# Patient Record
Sex: Female | Born: 1965
Health system: Southern US, Community
[De-identification: ages and names within clinical notes are randomized; demographics above are authoritative.]

## PROBLEM LIST (undated history)

## (undated) DIAGNOSIS — R51 Headache: Secondary | ICD-10-CM

## (undated) DIAGNOSIS — K219 Gastro-esophageal reflux disease without esophagitis: Secondary | ICD-10-CM

## (undated) DIAGNOSIS — F4310 Post-traumatic stress disorder, unspecified: Secondary | ICD-10-CM

## (undated) DIAGNOSIS — I10 Essential (primary) hypertension: Secondary | ICD-10-CM

## (undated) DIAGNOSIS — F419 Anxiety disorder, unspecified: Secondary | ICD-10-CM

## (undated) DIAGNOSIS — F32A Depression, unspecified: Secondary | ICD-10-CM

## (undated) DIAGNOSIS — R519 Headache, unspecified: Secondary | ICD-10-CM

## (undated) DIAGNOSIS — Z8489 Family history of other specified conditions: Secondary | ICD-10-CM

## (undated) DIAGNOSIS — F329 Major depressive disorder, single episode, unspecified: Secondary | ICD-10-CM

## (undated) DIAGNOSIS — R011 Cardiac murmur, unspecified: Secondary | ICD-10-CM

## (undated) DIAGNOSIS — D473 Essential (hemorrhagic) thrombocythemia: Secondary | ICD-10-CM

## (undated) HISTORY — DX: Anxiety disorder, unspecified: F41.9

## (undated) HISTORY — DX: Post-traumatic stress disorder, unspecified: F43.10

## (undated) HISTORY — DX: Major depressive disorder, single episode, unspecified: F32.9

## (undated) HISTORY — DX: Depression, unspecified: F32.A

## (undated) HISTORY — DX: Essential (primary) hypertension: I10

---

## 1998-02-01 ENCOUNTER — Other Ambulatory Visit: Admission: RE | Admit: 1998-02-01 | Discharge: 1998-02-01 | Payer: Self-pay | Admitting: Obstetrics and Gynecology

## 1998-09-09 ENCOUNTER — Encounter: Payer: Self-pay | Admitting: Obstetrics and Gynecology

## 1998-09-09 ENCOUNTER — Ambulatory Visit (HOSPITAL_COMMUNITY): Admission: RE | Admit: 1998-09-09 | Discharge: 1998-09-09 | Payer: Self-pay | Admitting: Obstetrics and Gynecology

## 1999-01-26 ENCOUNTER — Emergency Department (HOSPITAL_COMMUNITY): Admission: EM | Admit: 1999-01-26 | Discharge: 1999-01-26 | Payer: Self-pay | Admitting: Emergency Medicine

## 1999-01-30 ENCOUNTER — Emergency Department (HOSPITAL_COMMUNITY): Admission: EM | Admit: 1999-01-30 | Discharge: 1999-01-30 | Payer: Self-pay | Admitting: Emergency Medicine

## 1999-02-05 ENCOUNTER — Other Ambulatory Visit: Admission: RE | Admit: 1999-02-05 | Discharge: 1999-02-05 | Payer: Self-pay | Admitting: Obstetrics and Gynecology

## 1999-03-11 ENCOUNTER — Other Ambulatory Visit: Admission: RE | Admit: 1999-03-11 | Discharge: 1999-03-11 | Payer: Self-pay | Admitting: Obstetrics and Gynecology

## 1999-03-11 ENCOUNTER — Encounter (INDEPENDENT_AMBULATORY_CARE_PROVIDER_SITE_OTHER): Payer: Self-pay | Admitting: Specialist

## 1999-09-05 ENCOUNTER — Other Ambulatory Visit: Admission: RE | Admit: 1999-09-05 | Discharge: 1999-09-05 | Payer: Self-pay | Admitting: Obstetrics and Gynecology

## 2000-01-08 ENCOUNTER — Other Ambulatory Visit: Admission: RE | Admit: 2000-01-08 | Discharge: 2000-01-08 | Payer: Self-pay | Admitting: Obstetrics and Gynecology

## 2001-03-17 ENCOUNTER — Other Ambulatory Visit: Admission: RE | Admit: 2001-03-17 | Discharge: 2001-03-17 | Payer: Self-pay | Admitting: Obstetrics and Gynecology

## 2001-11-15 ENCOUNTER — Encounter (INDEPENDENT_AMBULATORY_CARE_PROVIDER_SITE_OTHER): Payer: Self-pay | Admitting: Specialist

## 2001-11-15 ENCOUNTER — Ambulatory Visit (HOSPITAL_COMMUNITY): Admission: RE | Admit: 2001-11-15 | Discharge: 2001-11-15 | Payer: Self-pay | Admitting: Obstetrics and Gynecology

## 2002-04-07 ENCOUNTER — Other Ambulatory Visit: Admission: RE | Admit: 2002-04-07 | Discharge: 2002-04-07 | Payer: Self-pay | Admitting: Obstetrics and Gynecology

## 2002-12-27 ENCOUNTER — Emergency Department (HOSPITAL_COMMUNITY): Admission: EM | Admit: 2002-12-27 | Discharge: 2002-12-28 | Payer: Self-pay | Admitting: Emergency Medicine

## 2003-06-06 ENCOUNTER — Other Ambulatory Visit: Admission: RE | Admit: 2003-06-06 | Discharge: 2003-06-06 | Payer: Self-pay | Admitting: Obstetrics and Gynecology

## 2004-05-21 ENCOUNTER — Ambulatory Visit (HOSPITAL_COMMUNITY): Admission: RE | Admit: 2004-05-21 | Discharge: 2004-05-21 | Payer: Self-pay | Admitting: Obstetrics and Gynecology

## 2004-05-21 ENCOUNTER — Encounter (INDEPENDENT_AMBULATORY_CARE_PROVIDER_SITE_OTHER): Payer: Self-pay | Admitting: Specialist

## 2004-10-28 ENCOUNTER — Ambulatory Visit: Payer: Self-pay | Admitting: Oncology

## 2004-11-19 ENCOUNTER — Ambulatory Visit (HOSPITAL_COMMUNITY): Admission: RE | Admit: 2004-11-19 | Discharge: 2004-11-19 | Payer: Self-pay | Admitting: Oncology

## 2004-12-10 ENCOUNTER — Ambulatory Visit: Payer: Self-pay | Admitting: Oncology

## 2004-12-10 ENCOUNTER — Ambulatory Visit (HOSPITAL_COMMUNITY): Admission: RE | Admit: 2004-12-10 | Discharge: 2004-12-10 | Payer: Self-pay | Admitting: Oncology

## 2004-12-10 ENCOUNTER — Encounter (INDEPENDENT_AMBULATORY_CARE_PROVIDER_SITE_OTHER): Payer: Self-pay | Admitting: *Deleted

## 2004-12-16 ENCOUNTER — Ambulatory Visit: Payer: Self-pay | Admitting: Oncology

## 2005-02-04 ENCOUNTER — Ambulatory Visit: Payer: Self-pay | Admitting: Oncology

## 2005-03-28 ENCOUNTER — Ambulatory Visit: Payer: Self-pay | Admitting: Oncology

## 2005-05-13 ENCOUNTER — Ambulatory Visit: Payer: Self-pay | Admitting: Oncology

## 2005-07-08 ENCOUNTER — Ambulatory Visit: Payer: Self-pay | Admitting: Oncology

## 2005-09-15 ENCOUNTER — Ambulatory Visit: Payer: Self-pay | Admitting: Oncology

## 2005-11-11 ENCOUNTER — Ambulatory Visit: Payer: Self-pay | Admitting: Oncology

## 2005-11-11 LAB — CBC WITH DIFFERENTIAL/PLATELET
BASO%: 1.5 % (ref 0.0–2.0)
EOS%: 0.8 % (ref 0.0–7.0)
HCT: 37 % (ref 34.8–46.6)
LYMPH%: 58.6 % — ABNORMAL HIGH (ref 14.0–48.0)
MCH: 36 pg — ABNORMAL HIGH (ref 26.0–34.0)
MCHC: 33.8 g/dL (ref 32.0–36.0)
NEUT%: 30.2 % — ABNORMAL LOW (ref 39.6–76.8)
Platelets: 536 10*3/uL — ABNORMAL HIGH (ref 145–400)
RBC: 3.46 10*6/uL — ABNORMAL LOW (ref 3.70–5.32)

## 2005-11-11 LAB — COMPREHENSIVE METABOLIC PANEL
Albumin: 4.3 g/dL (ref 3.5–5.2)
Alkaline Phosphatase: 72 U/L (ref 39–117)
CO2: 26 mEq/L (ref 19–32)
Calcium: 9 mg/dL (ref 8.4–10.5)
Chloride: 105 mEq/L (ref 96–112)
Glucose, Bld: 87 mg/dL (ref 70–99)
Potassium: 4.2 mEq/L (ref 3.5–5.3)
Sodium: 139 mEq/L (ref 135–145)
Total Protein: 7.1 g/dL (ref 6.0–8.3)

## 2005-11-25 LAB — CBC WITH DIFFERENTIAL/PLATELET
Basophils Absolute: 0 10*3/uL (ref 0.0–0.1)
Eosinophils Absolute: 0.1 10*3/uL (ref 0.0–0.5)
HCT: 36.9 % (ref 34.8–46.6)
HGB: 12.4 g/dL (ref 11.6–15.9)
LYMPH%: 34.2 % (ref 14.0–48.0)
MCV: 108 fL — ABNORMAL HIGH (ref 81.0–101.0)
MONO#: 0.3 10*3/uL (ref 0.1–0.9)
MONO%: 5.4 % (ref 0.0–13.0)
NEUT#: 3 10*3/uL (ref 1.5–6.5)
Platelets: 558 10*3/uL — ABNORMAL HIGH (ref 145–400)
RBC: 3.42 10*6/uL — ABNORMAL LOW (ref 3.70–5.32)
WBC: 5.1 10*3/uL (ref 3.9–10.0)

## 2005-12-09 LAB — CBC WITH DIFFERENTIAL/PLATELET
BASO%: 1.3 % (ref 0.0–2.0)
Eosinophils Absolute: 0.1 10*3/uL (ref 0.0–0.5)
HCT: 34.5 % — ABNORMAL LOW (ref 34.8–46.6)
LYMPH%: 55.3 % — ABNORMAL HIGH (ref 14.0–48.0)
MCHC: 33.9 g/dL (ref 32.0–36.0)
MCV: 106.2 fL — ABNORMAL HIGH (ref 81.0–101.0)
MONO#: 0.3 10*3/uL (ref 0.1–0.9)
MONO%: 7.8 % (ref 0.0–13.0)
NEUT%: 34.2 % — ABNORMAL LOW (ref 39.6–76.8)
Platelets: 457 10*3/uL — ABNORMAL HIGH (ref 145–400)
RBC: 3.24 10*6/uL — ABNORMAL LOW (ref 3.70–5.32)
WBC: 3.6 10*3/uL — ABNORMAL LOW (ref 3.9–10.0)

## 2006-01-02 ENCOUNTER — Ambulatory Visit: Payer: Self-pay | Admitting: Oncology

## 2006-01-06 LAB — CBC WITH DIFFERENTIAL/PLATELET
Basophils Absolute: 0 10*3/uL (ref 0.0–0.1)
Eosinophils Absolute: 0 10*3/uL (ref 0.0–0.5)
HGB: 14.2 g/dL (ref 11.6–15.9)
MCV: 106.7 fL — ABNORMAL HIGH (ref 81.0–101.0)
MONO#: 0.3 10*3/uL (ref 0.1–0.9)
MONO%: 5.4 % (ref 0.0–13.0)
NEUT#: 2.4 10*3/uL (ref 1.5–6.5)
RBC: 3.96 10*6/uL (ref 3.70–5.32)
RDW: 13.8 % (ref 11.3–14.5)
WBC: 4.9 10*3/uL (ref 3.9–10.0)
lymph#: 2.1 10*3/uL (ref 0.9–3.3)

## 2006-02-03 LAB — CBC WITH DIFFERENTIAL/PLATELET
Basophils Absolute: 0.1 10*3/uL (ref 0.0–0.1)
EOS%: 1.6 % (ref 0.0–7.0)
Eosinophils Absolute: 0.1 10*3/uL (ref 0.0–0.5)
HCT: 34.9 % (ref 34.8–46.6)
HGB: 11.8 g/dL (ref 11.6–15.9)
LYMPH%: 37.1 % (ref 14.0–48.0)
MCH: 36.3 pg — ABNORMAL HIGH (ref 26.0–34.0)
MCV: 107 fL — ABNORMAL HIGH (ref 81.0–101.0)
MONO%: 4.6 % (ref 0.0–13.0)
NEUT#: 2.1 10*3/uL (ref 1.5–6.5)
NEUT%: 55.1 % (ref 39.6–76.8)
Platelets: 464 10*3/uL — ABNORMAL HIGH (ref 145–400)
RDW: 13.5 % (ref 11.3–14.5)

## 2006-02-13 ENCOUNTER — Ambulatory Visit: Payer: Self-pay | Admitting: Oncology

## 2006-02-13 LAB — COMPREHENSIVE METABOLIC PANEL
AST: 16 U/L (ref 0–37)
Albumin: 4.3 g/dL (ref 3.5–5.2)
BUN: 12 mg/dL (ref 6–23)
Calcium: 9.1 mg/dL (ref 8.4–10.5)
Chloride: 104 mEq/L (ref 96–112)
Creatinine, Ser: 1.03 mg/dL (ref 0.40–1.20)
Glucose, Bld: 123 mg/dL — ABNORMAL HIGH (ref 70–99)
Potassium: 4 mEq/L (ref 3.5–5.3)

## 2006-02-13 LAB — CBC WITH DIFFERENTIAL/PLATELET
EOS%: 1.3 % (ref 0.0–7.0)
Eosinophils Absolute: 0 10*3/uL (ref 0.0–0.5)
LYMPH%: 52.1 % — ABNORMAL HIGH (ref 14.0–48.0)
MCH: 35.6 pg — ABNORMAL HIGH (ref 26.0–34.0)
MCV: 106.5 fL — ABNORMAL HIGH (ref 81.0–101.0)
MONO%: 6.7 % (ref 0.0–13.0)
NEUT#: 1.5 10*3/uL (ref 1.5–6.5)
Platelets: 438 10*3/uL — ABNORMAL HIGH (ref 145–400)
RBC: 3.35 10*6/uL — ABNORMAL LOW (ref 3.70–5.32)

## 2006-03-17 LAB — CBC WITH DIFFERENTIAL/PLATELET
BASO%: 0.7 % (ref 0.0–2.0)
Basophils Absolute: 0 10*3/uL (ref 0.0–0.1)
EOS%: 1.1 % (ref 0.0–7.0)
HCT: 34.2 % — ABNORMAL LOW (ref 34.8–46.6)
HGB: 11.8 g/dL (ref 11.6–15.9)
MCH: 36.5 pg — ABNORMAL HIGH (ref 26.0–34.0)
MCHC: 34.4 g/dL (ref 32.0–36.0)
MONO#: 0.2 10*3/uL (ref 0.1–0.9)
NEUT%: 39.7 % (ref 39.6–76.8)
RDW: 14 % (ref 11.3–14.5)
WBC: 3.5 10*3/uL — ABNORMAL LOW (ref 3.9–10.0)
lymph#: 1.9 10*3/uL (ref 0.9–3.3)

## 2006-04-10 ENCOUNTER — Ambulatory Visit: Payer: Self-pay | Admitting: Oncology

## 2006-04-14 LAB — CBC WITH DIFFERENTIAL/PLATELET
Basophils Absolute: 0 10*3/uL (ref 0.0–0.1)
Eosinophils Absolute: 0.1 10*3/uL (ref 0.0–0.5)
HCT: 35.2 % (ref 34.8–46.6)
HGB: 11.9 g/dL (ref 11.6–15.9)
MCH: 36.2 pg — ABNORMAL HIGH (ref 26.0–34.0)
MONO#: 0.2 10*3/uL (ref 0.1–0.9)
NEUT#: 2 10*3/uL (ref 1.5–6.5)
NEUT%: 45.4 % (ref 39.6–76.8)
RDW: 14.1 % (ref 11.3–14.5)
lymph#: 2.1 10*3/uL (ref 0.9–3.3)

## 2006-05-19 LAB — CBC WITH DIFFERENTIAL/PLATELET
BASO%: 0.5 % (ref 0.0–2.0)
HCT: 37 % (ref 34.8–46.6)
MCHC: 33.9 g/dL (ref 32.0–36.0)
MONO#: 0.3 10*3/uL (ref 0.1–0.9)
NEUT%: 51 % (ref 39.6–76.8)
RBC: 3.52 10*6/uL — ABNORMAL LOW (ref 3.70–5.32)
WBC: 4.6 10*3/uL (ref 3.9–10.0)
lymph#: 1.9 10*3/uL (ref 0.9–3.3)

## 2006-05-26 ENCOUNTER — Ambulatory Visit: Payer: Self-pay | Admitting: Oncology

## 2006-05-26 LAB — CBC WITH DIFFERENTIAL/PLATELET
BASO%: 0.6 % (ref 0.0–2.0)
EOS%: 0.8 % (ref 0.0–7.0)
MCH: 35.9 pg — ABNORMAL HIGH (ref 26.0–34.0)
MCHC: 33.9 g/dL (ref 32.0–36.0)
MONO#: 0.3 10*3/uL (ref 0.1–0.9)
RBC: 3.71 10*6/uL (ref 3.70–5.32)
WBC: 4.5 10*3/uL (ref 3.9–10.0)
lymph#: 2.2 10*3/uL (ref 0.9–3.3)

## 2006-06-02 LAB — CBC WITH DIFFERENTIAL/PLATELET
Basophils Absolute: 0 10*3/uL (ref 0.0–0.1)
Eosinophils Absolute: 0 10*3/uL (ref 0.0–0.5)
HGB: 13 g/dL (ref 11.6–15.9)
MCV: 104.6 fL — ABNORMAL HIGH (ref 81.0–101.0)
MONO#: 0.3 10*3/uL (ref 0.1–0.9)
MONO%: 6.9 % (ref 0.0–13.0)
NEUT#: 1.3 10*3/uL — ABNORMAL LOW (ref 1.5–6.5)
RDW: 13.1 % (ref 11.3–14.5)
WBC: 4 10*3/uL (ref 3.9–10.0)
lymph#: 2.3 10*3/uL (ref 0.9–3.3)

## 2006-06-09 LAB — COMPREHENSIVE METABOLIC PANEL
Albumin: 4.3 g/dL (ref 3.5–5.2)
BUN: 12 mg/dL (ref 6–23)
CO2: 28 mEq/L (ref 19–32)
Calcium: 9.1 mg/dL (ref 8.4–10.5)
Chloride: 112 mEq/L (ref 96–112)
Glucose, Bld: 85 mg/dL (ref 70–99)
Potassium: 4.4 mEq/L (ref 3.5–5.3)

## 2006-06-09 LAB — CBC WITH DIFFERENTIAL/PLATELET
Basophils Absolute: 0 10*3/uL (ref 0.0–0.1)
Eosinophils Absolute: 0.1 10*3/uL (ref 0.0–0.5)
HCT: 35.9 % (ref 34.8–46.6)
HGB: 12.2 g/dL (ref 11.6–15.9)
NEUT#: 2.4 10*3/uL (ref 1.5–6.5)
NEUT%: 50.2 % (ref 39.6–76.8)
RDW: 13.6 % (ref 11.3–14.5)
lymph#: 2 10*3/uL (ref 0.9–3.3)

## 2006-06-09 LAB — LACTATE DEHYDROGENASE: LDH: 205 U/L (ref 94–250)

## 2006-06-12 LAB — JAK2 GENOTYPR

## 2006-06-23 LAB — CBC WITH DIFFERENTIAL/PLATELET
BASO%: 0.5 % (ref 0.0–2.0)
LYMPH%: 43.6 % (ref 14.0–48.0)
MCHC: 34.1 g/dL (ref 32.0–36.0)
MONO#: 0.3 10*3/uL (ref 0.1–0.9)
Platelets: 465 10*3/uL — ABNORMAL HIGH (ref 145–400)
RBC: 3.5 10*6/uL — ABNORMAL LOW (ref 3.70–5.32)
RDW: 13.7 % (ref 11.3–14.5)
WBC: 4.3 10*3/uL (ref 3.9–10.0)
lymph#: 1.9 10*3/uL (ref 0.9–3.3)

## 2006-07-07 LAB — CBC WITH DIFFERENTIAL/PLATELET
BASO%: 0.6 % (ref 0.0–2.0)
EOS%: 1 % (ref 0.0–7.0)
MCH: 36 pg — ABNORMAL HIGH (ref 26.0–34.0)
MCHC: 33.8 g/dL (ref 32.0–36.0)
NEUT%: 44.5 % (ref 39.6–76.8)
RDW: 14.6 % — ABNORMAL HIGH (ref 11.3–14.5)
lymph#: 1.7 10*3/uL (ref 0.9–3.3)

## 2006-07-15 ENCOUNTER — Ambulatory Visit: Payer: Self-pay | Admitting: Oncology

## 2006-07-22 LAB — CBC WITH DIFFERENTIAL/PLATELET
Basophils Absolute: 0 10*3/uL (ref 0.0–0.1)
EOS%: 1.2 % (ref 0.0–7.0)
HGB: 12.4 g/dL (ref 11.6–15.9)
MCH: 35.8 pg — ABNORMAL HIGH (ref 26.0–34.0)
NEUT#: 1.5 10*3/uL (ref 1.5–6.5)
RDW: 15.1 % — ABNORMAL HIGH (ref 11.3–14.5)
lymph#: 2 10*3/uL (ref 0.9–3.3)

## 2006-08-04 LAB — COMPREHENSIVE METABOLIC PANEL
ALT: 13 U/L (ref 0–35)
Alkaline Phosphatase: 85 U/L (ref 39–117)
CO2: 27 mEq/L (ref 19–32)
Sodium: 138 mEq/L (ref 135–145)
Total Bilirubin: 0.3 mg/dL (ref 0.3–1.2)
Total Protein: 7.3 g/dL (ref 6.0–8.3)

## 2006-08-04 LAB — CBC WITH DIFFERENTIAL/PLATELET
BASO%: 0.6 % (ref 0.0–2.0)
LYMPH%: 48.4 % — ABNORMAL HIGH (ref 14.0–48.0)
MCHC: 33.5 g/dL (ref 32.0–36.0)
MONO#: 0.3 10*3/uL (ref 0.1–0.9)
MONO%: 6.6 % (ref 0.0–13.0)
Platelets: 494 10*3/uL — ABNORMAL HIGH (ref 145–400)
RBC: 3.41 10*6/uL — ABNORMAL LOW (ref 3.70–5.32)
WBC: 4.2 10*3/uL (ref 3.9–10.0)

## 2006-08-28 ENCOUNTER — Ambulatory Visit: Payer: Self-pay | Admitting: Oncology

## 2006-09-01 LAB — CBC WITH DIFFERENTIAL/PLATELET
BASO%: 0.6 % (ref 0.0–2.0)
Basophils Absolute: 0 10*3/uL (ref 0.0–0.1)
EOS%: 1.3 % (ref 0.0–7.0)
HGB: 12.3 g/dL (ref 11.6–15.9)
MCH: 37.2 pg — ABNORMAL HIGH (ref 26.0–34.0)
MCV: 105.8 fL — ABNORMAL HIGH (ref 81.0–101.0)
MONO%: 8.1 % (ref 0.0–13.0)
RBC: 3.31 10*6/uL — ABNORMAL LOW (ref 3.70–5.32)
RDW: 14.9 % — ABNORMAL HIGH (ref 11.3–14.5)
lymph#: 1.6 10*3/uL (ref 0.9–3.3)

## 2006-09-29 LAB — CBC WITH DIFFERENTIAL/PLATELET
Basophils Absolute: 0 10*3/uL (ref 0.0–0.1)
Eosinophils Absolute: 0 10*3/uL (ref 0.0–0.5)
HGB: 12.2 g/dL (ref 11.6–15.9)
MCV: 106.8 fL — ABNORMAL HIGH (ref 81.0–101.0)
MONO#: 0.3 10*3/uL (ref 0.1–0.9)
MONO%: 7.4 % (ref 0.0–13.0)
NEUT#: 1.2 10*3/uL — ABNORMAL LOW (ref 1.5–6.5)
RBC: 3.41 10*6/uL — ABNORMAL LOW (ref 3.70–5.32)
RDW: 14.1 % (ref 11.3–14.5)
WBC: 3.6 10*3/uL — ABNORMAL LOW (ref 3.9–10.0)
lymph#: 2.1 10*3/uL (ref 0.9–3.3)

## 2006-10-29 ENCOUNTER — Ambulatory Visit: Payer: Self-pay | Admitting: Oncology

## 2006-11-03 LAB — LACTATE DEHYDROGENASE: LDH: 190 U/L (ref 94–250)

## 2006-11-03 LAB — CBC WITH DIFFERENTIAL/PLATELET
Basophils Absolute: 0 10*3/uL (ref 0.0–0.1)
Eosinophils Absolute: 0 10*3/uL (ref 0.0–0.5)
HGB: 11.7 g/dL (ref 11.6–15.9)
LYMPH%: 44 % (ref 14.0–48.0)
MCV: 106.2 fL — ABNORMAL HIGH (ref 81.0–101.0)
MONO#: 0.3 10*3/uL (ref 0.1–0.9)
MONO%: 7.6 % (ref 0.0–13.0)
NEUT#: 1.7 10*3/uL (ref 1.5–6.5)
Platelets: 492 10*3/uL — ABNORMAL HIGH (ref 145–400)
RBC: 3.15 10*6/uL — ABNORMAL LOW (ref 3.70–5.32)
WBC: 3.7 10*3/uL — ABNORMAL LOW (ref 3.9–10.0)

## 2006-11-03 LAB — IRON AND TIBC
%SAT: 18 % — ABNORMAL LOW (ref 20–55)
TIBC: 328 ug/dL (ref 250–470)
UIBC: 270 ug/dL

## 2006-11-03 LAB — COMPREHENSIVE METABOLIC PANEL
Albumin: 4.1 g/dL (ref 3.5–5.2)
BUN: 11 mg/dL (ref 6–23)
CO2: 23 mEq/L (ref 19–32)
Glucose, Bld: 110 mg/dL — ABNORMAL HIGH (ref 70–99)
Potassium: 3.7 mEq/L (ref 3.5–5.3)
Sodium: 139 mEq/L (ref 135–145)
Total Bilirubin: 0.3 mg/dL (ref 0.3–1.2)
Total Protein: 6.6 g/dL (ref 6.0–8.3)

## 2006-11-03 LAB — FERRITIN: Ferritin: 18 ng/mL (ref 10–291)

## 2006-12-01 LAB — CBC WITH DIFFERENTIAL/PLATELET
BASO%: 0.2 % (ref 0.0–2.0)
Eosinophils Absolute: 0 10*3/uL (ref 0.0–0.5)
MONO#: 0.4 10*3/uL (ref 0.1–0.9)
NEUT#: 4 10*3/uL (ref 1.5–6.5)
RBC: 3.44 10*6/uL — ABNORMAL LOW (ref 3.70–5.32)
RDW: 13.7 % (ref 11.3–14.5)
WBC: 8.1 10*3/uL (ref 3.9–10.0)

## 2006-12-01 LAB — TECHNOLOGIST REVIEW

## 2006-12-08 LAB — CBC WITH DIFFERENTIAL/PLATELET
BASO%: 0.5 % (ref 0.0–2.0)
Basophils Absolute: 0 10*3/uL (ref 0.0–0.1)
HCT: 33.8 % — ABNORMAL LOW (ref 34.8–46.6)
HGB: 11.9 g/dL (ref 11.6–15.9)
MCHC: 35.1 g/dL (ref 32.0–36.0)
MONO#: 0.3 10*3/uL (ref 0.1–0.9)
NEUT%: 46.5 % (ref 39.6–76.8)
RDW: 14.1 % (ref 11.3–14.5)
WBC: 4.3 10*3/uL (ref 3.9–10.0)
lymph#: 2 10*3/uL (ref 0.9–3.3)

## 2006-12-25 ENCOUNTER — Ambulatory Visit: Payer: Self-pay | Admitting: Oncology

## 2006-12-29 LAB — CBC WITH DIFFERENTIAL/PLATELET
Basophils Absolute: 0 10*3/uL (ref 0.0–0.1)
EOS%: 1.6 % (ref 0.0–7.0)
Eosinophils Absolute: 0.1 10*3/uL (ref 0.0–0.5)
HCT: 33.2 % — ABNORMAL LOW (ref 34.8–46.6)
HGB: 11.4 g/dL — ABNORMAL LOW (ref 11.6–15.9)
MCH: 36.9 pg — ABNORMAL HIGH (ref 26.0–34.0)
MONO#: 0.3 10*3/uL (ref 0.1–0.9)
NEUT#: 1.9 10*3/uL (ref 1.5–6.5)
NEUT%: 43.5 % (ref 39.6–76.8)
RDW: 14 % (ref 11.3–14.5)
lymph#: 2.1 10*3/uL (ref 0.9–3.3)

## 2007-01-26 LAB — CBC WITH DIFFERENTIAL/PLATELET
Basophils Absolute: 0 10*3/uL (ref 0.0–0.1)
Eosinophils Absolute: 0 10*3/uL (ref 0.0–0.5)
HCT: 37.4 % (ref 34.8–46.6)
HGB: 12.8 g/dL (ref 11.6–15.9)
MONO#: 0.3 10*3/uL (ref 0.1–0.9)
NEUT#: 1.8 10*3/uL (ref 1.5–6.5)
NEUT%: 43.7 % (ref 39.6–76.8)
RDW: 13.8 % (ref 11.3–14.5)
WBC: 4.1 10*3/uL (ref 3.9–10.0)
lymph#: 2 10*3/uL (ref 0.9–3.3)

## 2007-01-26 LAB — COMPREHENSIVE METABOLIC PANEL
AST: 15 U/L (ref 0–37)
Albumin: 4.7 g/dL (ref 3.5–5.2)
BUN: 13 mg/dL (ref 6–23)
CO2: 28 mEq/L (ref 19–32)
Calcium: 9.5 mg/dL (ref 8.4–10.5)
Chloride: 102 mEq/L (ref 96–112)
Creatinine, Ser: 1.14 mg/dL (ref 0.40–1.20)
Potassium: 4 mEq/L (ref 3.5–5.3)

## 2007-01-26 LAB — LACTATE DEHYDROGENASE: LDH: 201 U/L (ref 94–250)

## 2007-03-21 ENCOUNTER — Ambulatory Visit: Payer: Self-pay | Admitting: Oncology

## 2007-03-23 LAB — CBC WITH DIFFERENTIAL/PLATELET
Basophils Absolute: 0 10*3/uL (ref 0.0–0.1)
EOS%: 0.7 % (ref 0.0–7.0)
Eosinophils Absolute: 0 10*3/uL (ref 0.0–0.5)
HGB: 12.4 g/dL (ref 11.6–15.9)
MCH: 37.2 pg — ABNORMAL HIGH (ref 26.0–34.0)
MCV: 107.1 fL — ABNORMAL HIGH (ref 81.0–101.0)
MONO%: 6.5 % (ref 0.0–13.0)
NEUT#: 1.7 10*3/uL (ref 1.5–6.5)
RBC: 3.34 10*6/uL — ABNORMAL LOW (ref 3.70–5.32)
RDW: 14.8 % — ABNORMAL HIGH (ref 11.3–14.5)
lymph#: 1.6 10*3/uL (ref 0.9–3.3)

## 2007-04-18 IMAGING — US US ABDOMEN LIMITED
1 series · 14 of 14 positions shown · non-contrast
Comparison: none

CLINICAL DATA: Elevated platelet count.  Suspect splenomegaly.  Evaluate spleen size.
 LIMITED ABDOMINAL ULTRASOUND:
 The spleen measures approximately 9.4 cm in length and is within normal limits in size. The spleen is also homogeneous in echogenicity.

[Series 1: spleen · 0.33mm/px · 14 of 14 slices shown]
[im 1/14]
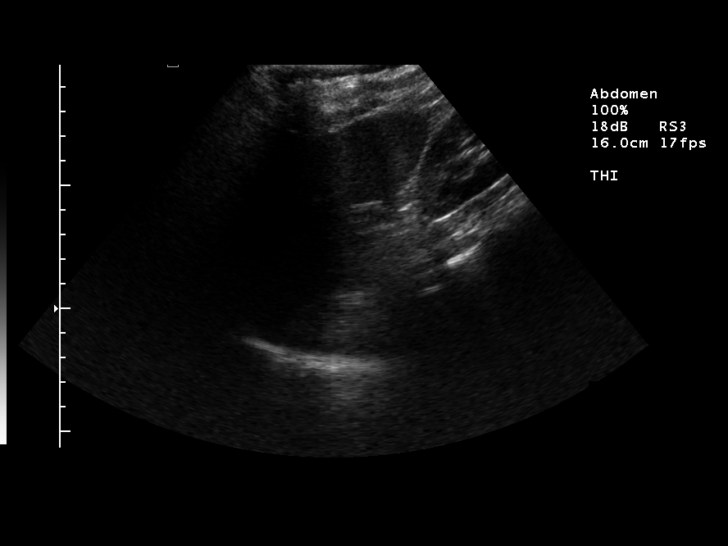
[im 2/14]
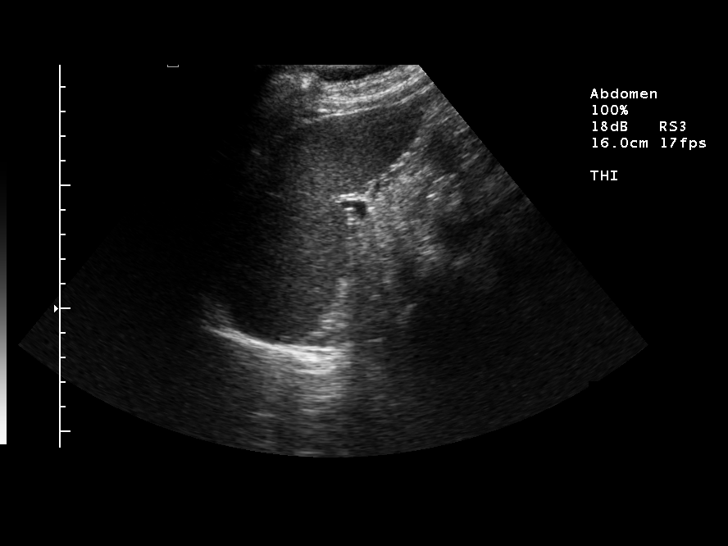
[im 3/14]
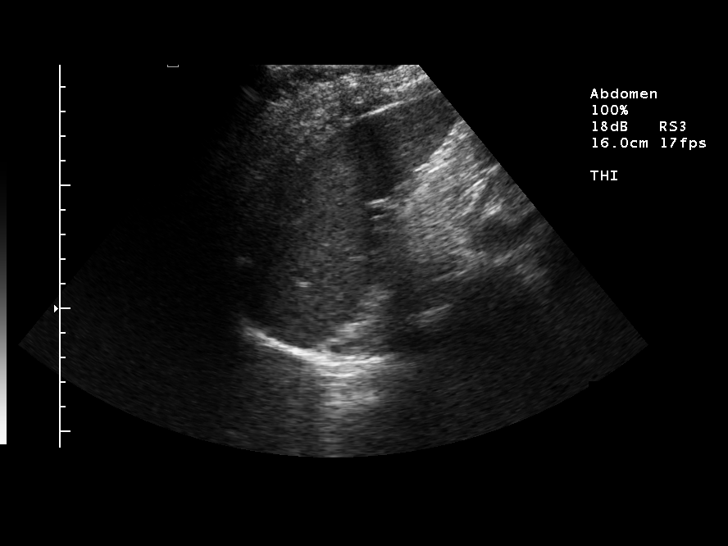
[im 4/14]
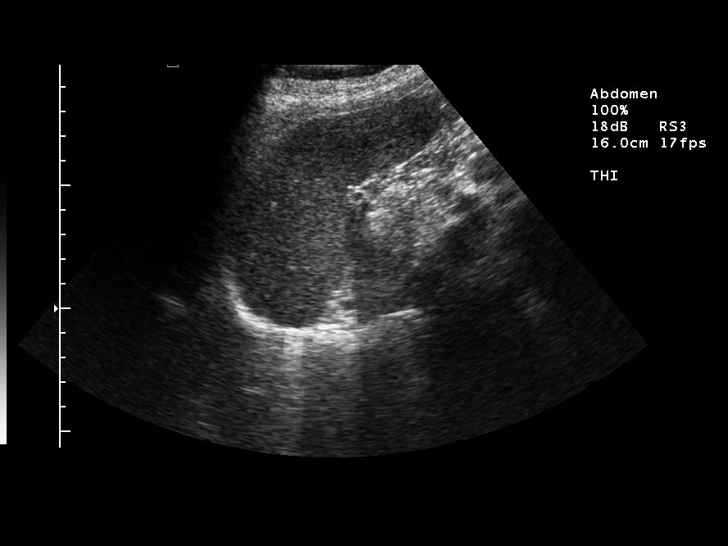
[im 5/14]
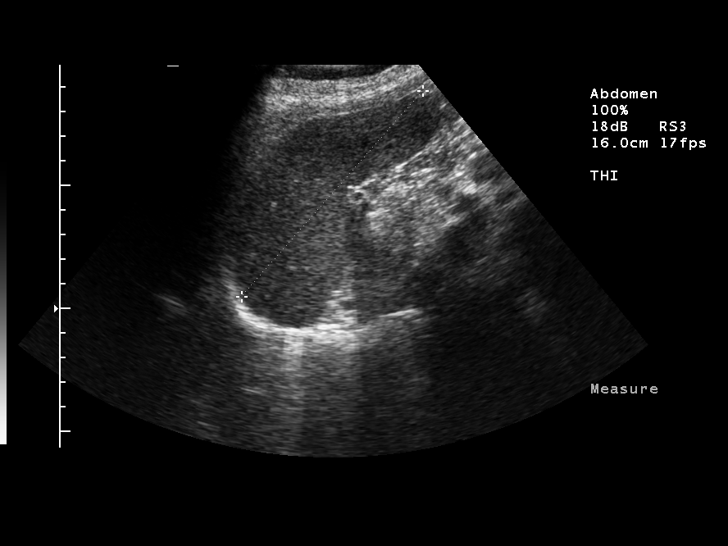
[im 6/14]
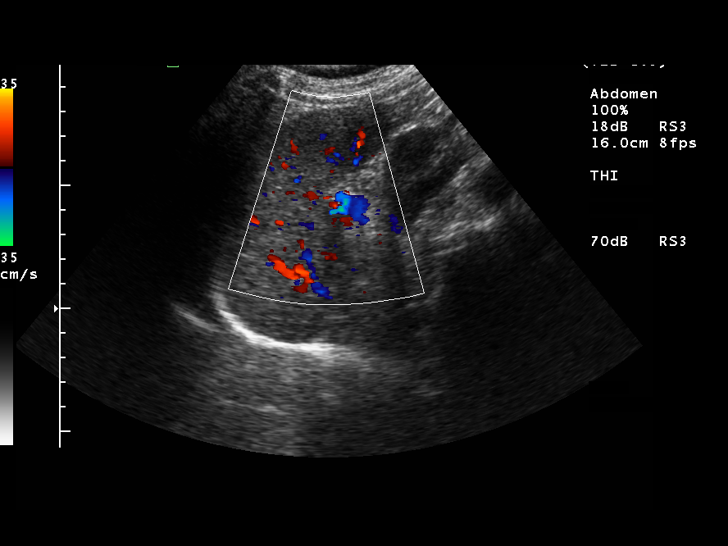
[im 7/14]
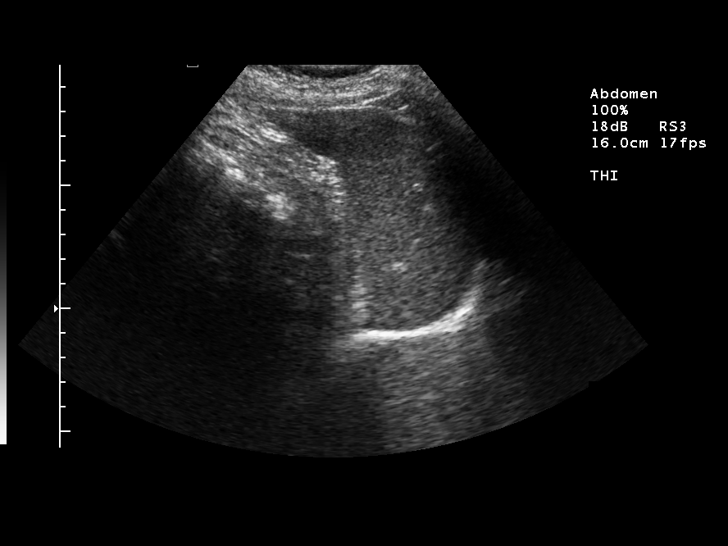
[im 8/14]
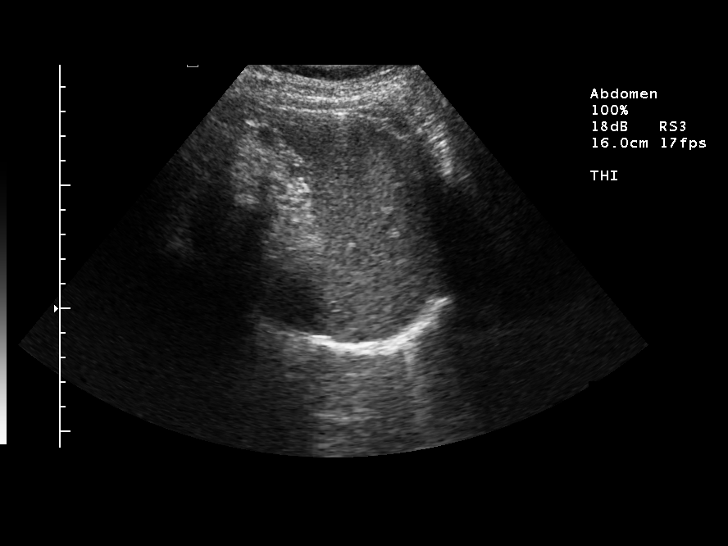
[im 9/14]
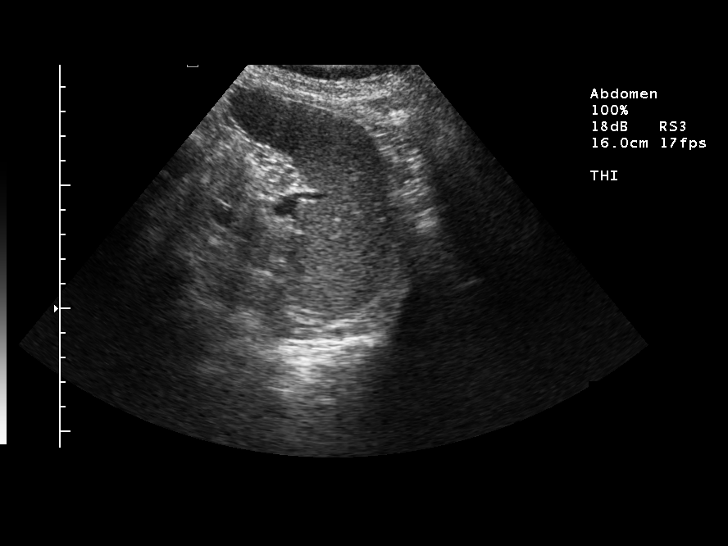
[im 10/14]
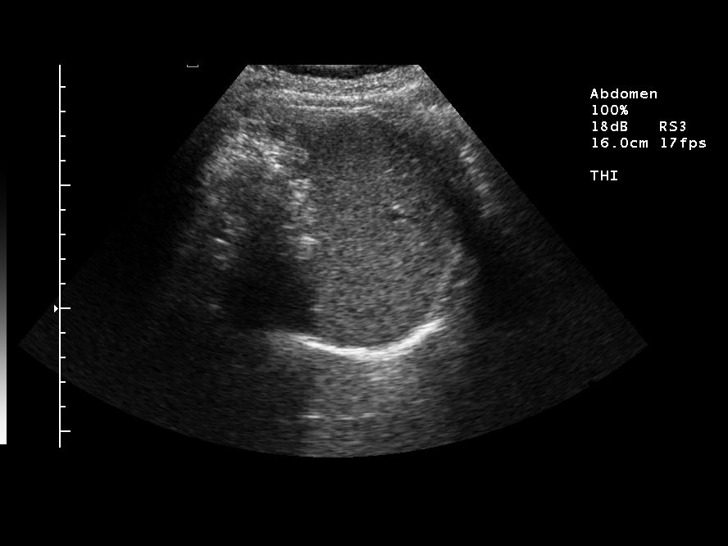
[im 11/14]
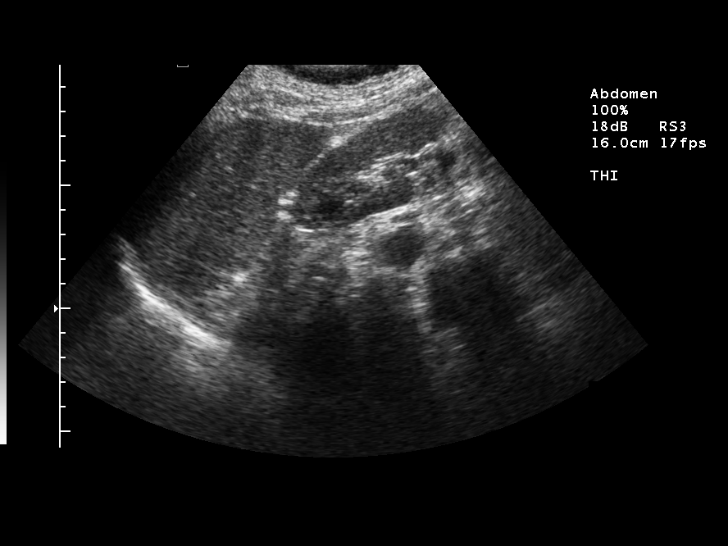
[im 12/14]
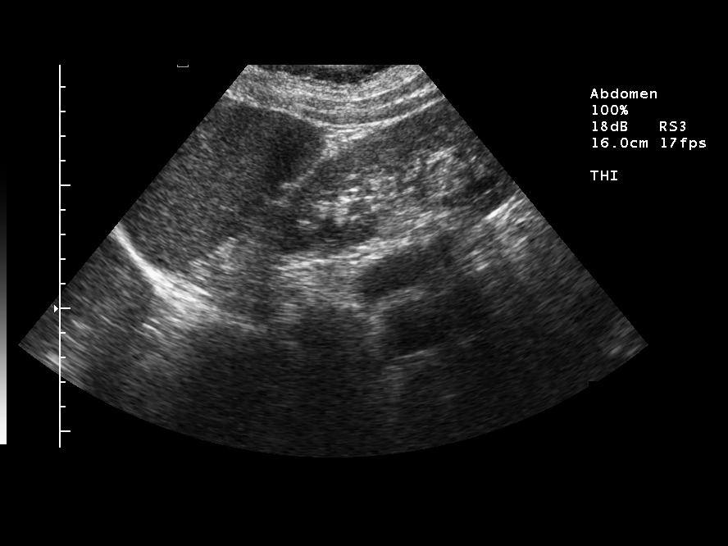
[im 13/14]
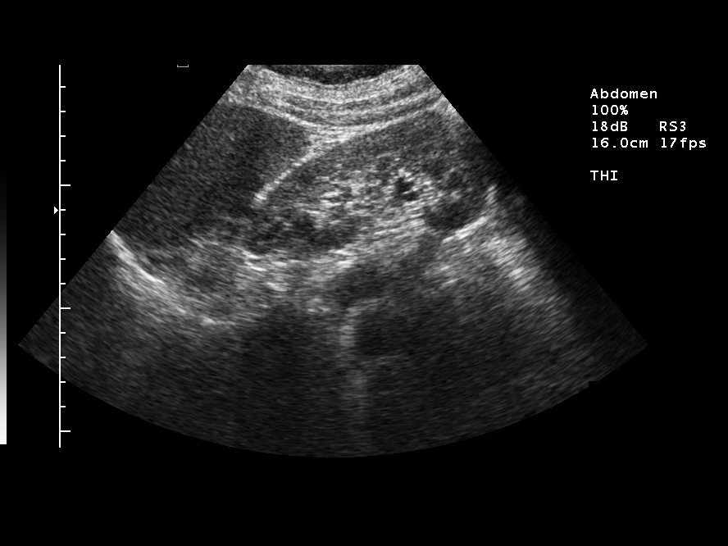
[im 14/14]
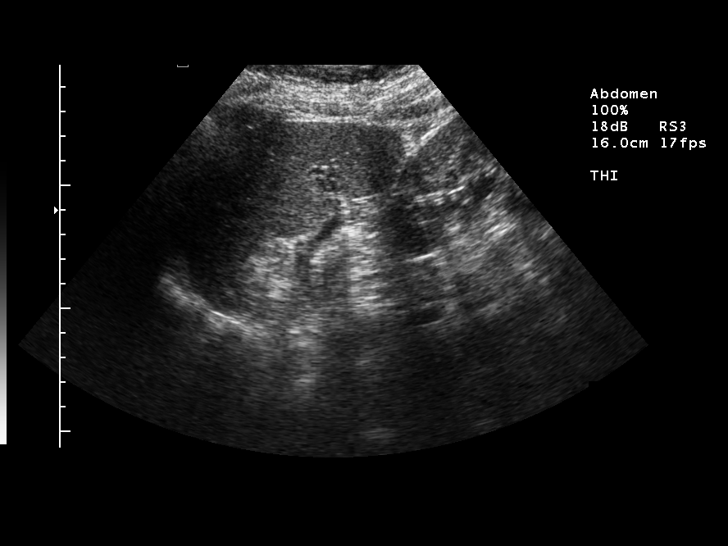

[14 of 14 positions shown; findings below may reference images not displayed]

IMPRESSION: No evidence of splenomegaly.

## 2007-06-01 ENCOUNTER — Ambulatory Visit: Payer: Self-pay | Admitting: Oncology

## 2007-06-03 LAB — CBC WITH DIFFERENTIAL/PLATELET
Basophils Absolute: 0 10*3/uL (ref 0.0–0.1)
Eosinophils Absolute: 0 10*3/uL (ref 0.0–0.5)
HGB: 11.4 g/dL — ABNORMAL LOW (ref 11.6–15.9)
MONO#: 0.2 10*3/uL (ref 0.1–0.9)
NEUT#: 1.6 10*3/uL (ref 1.5–6.5)
RBC: 3.04 10*6/uL — ABNORMAL LOW (ref 3.70–5.32)
RDW: 14.5 % (ref 11.3–14.5)
WBC: 3.4 10*3/uL — ABNORMAL LOW (ref 3.9–10.0)

## 2007-06-03 LAB — COMPREHENSIVE METABOLIC PANEL
Albumin: 4.1 g/dL (ref 3.5–5.2)
Alkaline Phosphatase: 74 U/L (ref 39–117)
BUN: 12 mg/dL (ref 6–23)
CO2: 25 mEq/L (ref 19–32)
Calcium: 9 mg/dL (ref 8.4–10.5)
Chloride: 107 mEq/L (ref 96–112)
Glucose, Bld: 95 mg/dL (ref 70–99)
Potassium: 4.3 mEq/L (ref 3.5–5.3)
Sodium: 140 mEq/L (ref 135–145)
Total Protein: 6.9 g/dL (ref 6.0–8.3)

## 2007-06-03 LAB — IRON AND TIBC
%SAT: 24 % (ref 20–55)
TIBC: 324 ug/dL (ref 250–470)
UIBC: 247 ug/dL

## 2007-06-03 LAB — LACTATE DEHYDROGENASE: LDH: 185 U/L (ref 94–250)

## 2007-09-09 ENCOUNTER — Ambulatory Visit: Payer: Self-pay | Admitting: Oncology

## 2007-09-09 LAB — CBC WITH DIFFERENTIAL/PLATELET
BASO%: 0.3 % (ref 0.0–2.0)
Basophils Absolute: 0 10*3/uL (ref 0.0–0.1)
EOS%: 1.2 % (ref 0.0–7.0)
HCT: 37.6 % (ref 34.8–46.6)
HGB: 12.1 g/dL (ref 11.6–15.9)
LYMPH%: 58.7 % — ABNORMAL HIGH (ref 14.0–48.0)
MCH: 34.3 pg — ABNORMAL HIGH (ref 26.0–34.0)
MCHC: 32.3 g/dL (ref 32.0–36.0)
MCV: 106.5 fL — ABNORMAL HIGH (ref 81.0–101.0)
MONO%: 7.1 % (ref 0.0–13.0)
NEUT%: 32.7 % — ABNORMAL LOW (ref 39.6–76.8)
Platelets: 656 10*3/uL — ABNORMAL HIGH (ref 145–400)
lymph#: 2.5 10*3/uL (ref 0.9–3.3)

## 2007-09-09 LAB — COMPREHENSIVE METABOLIC PANEL
ALT: 18 U/L (ref 0–35)
CO2: 28 mEq/L (ref 19–32)
Calcium: 9.8 mg/dL (ref 8.4–10.5)
Chloride: 101 mEq/L (ref 96–112)
Sodium: 137 mEq/L (ref 135–145)
Total Protein: 7.4 g/dL (ref 6.0–8.3)

## 2007-09-09 LAB — LACTATE DEHYDROGENASE: LDH: 224 U/L (ref 94–250)

## 2007-09-30 LAB — CBC WITH DIFFERENTIAL/PLATELET
Eosinophils Absolute: 0 10*3/uL (ref 0.0–0.5)
HCT: 33.3 % — ABNORMAL LOW (ref 34.8–46.6)
LYMPH%: 52.2 % — ABNORMAL HIGH (ref 14.0–48.0)
MCHC: 34.2 g/dL (ref 32.0–36.0)
MCV: 107 fL — ABNORMAL HIGH (ref 81.0–101.0)
MONO%: 6.7 % (ref 0.0–13.0)
NEUT#: 1.5 10*3/uL (ref 1.5–6.5)
NEUT%: 39.1 % — ABNORMAL LOW (ref 39.6–76.8)
Platelets: 461 10*3/uL — ABNORMAL HIGH (ref 145–400)
RBC: 3.11 10*6/uL — ABNORMAL LOW (ref 3.70–5.32)

## 2007-09-30 LAB — TECHNOLOGIST REVIEW

## 2007-10-26 ENCOUNTER — Ambulatory Visit: Payer: Self-pay | Admitting: Oncology

## 2007-10-29 LAB — CBC WITH DIFFERENTIAL/PLATELET
Basophils Absolute: 0 10*3/uL (ref 0.0–0.1)
Eosinophils Absolute: 0 10*3/uL (ref 0.0–0.5)
HGB: 12.4 g/dL (ref 11.6–15.9)
LYMPH%: 51.7 % — ABNORMAL HIGH (ref 14.0–48.0)
MCV: 108.2 fL — ABNORMAL HIGH (ref 81.0–101.0)
MONO#: 0.3 10*3/uL (ref 0.1–0.9)
MONO%: 7.8 % (ref 0.0–13.0)
NEUT#: 1.7 10*3/uL (ref 1.5–6.5)
Platelets: 637 10*3/uL — ABNORMAL HIGH (ref 145–400)

## 2007-12-02 LAB — CBC WITH DIFFERENTIAL/PLATELET
BASO%: 1.6 % (ref 0.0–2.0)
EOS%: 1.3 % (ref 0.0–7.0)
HCT: 33.7 % — ABNORMAL LOW (ref 34.8–46.6)
MCH: 36.7 pg — ABNORMAL HIGH (ref 26.0–34.0)
MCHC: 33.6 g/dL (ref 32.0–36.0)
NEUT%: 40.1 % (ref 39.6–76.8)
RDW: 13.7 % (ref 11.3–14.5)
lymph#: 2.2 10*3/uL (ref 0.9–3.3)

## 2007-12-04 LAB — COMPREHENSIVE METABOLIC PANEL
ALT: 10 U/L (ref 0–35)
AST: 12 U/L (ref 0–37)
Calcium: 8.7 mg/dL (ref 8.4–10.5)
Chloride: 105 mEq/L (ref 96–112)
Creatinine, Ser: 1.24 mg/dL — ABNORMAL HIGH (ref 0.40–1.20)
Total Bilirubin: 0.3 mg/dL (ref 0.3–1.2)

## 2007-12-04 LAB — IRON AND TIBC
%SAT: 19 % — ABNORMAL LOW (ref 20–55)
TIBC: 340 ug/dL (ref 250–470)

## 2007-12-28 ENCOUNTER — Ambulatory Visit: Payer: Self-pay | Admitting: Oncology

## 2007-12-30 LAB — CBC WITH DIFFERENTIAL/PLATELET
BASO%: 1.9 % (ref 0.0–2.0)
EOS%: 1.6 % (ref 0.0–7.0)
HCT: 36.2 % (ref 34.8–46.6)
LYMPH%: 56.8 % — ABNORMAL HIGH (ref 14.0–48.0)
MCH: 37.3 pg — ABNORMAL HIGH (ref 26.0–34.0)
MCHC: 34.2 g/dL (ref 32.0–36.0)
MCV: 109 fL — ABNORMAL HIGH (ref 81.0–101.0)
NEUT%: 34.6 % — ABNORMAL LOW (ref 39.6–76.8)
Platelets: 505 10*3/uL — ABNORMAL HIGH (ref 145–400)

## 2008-02-03 LAB — CBC WITH DIFFERENTIAL/PLATELET
BASO%: 0.5 % (ref 0.0–2.0)
LYMPH%: 41.5 % (ref 14.0–48.0)
MCHC: 34.1 g/dL (ref 32.0–36.0)
MONO#: 0.3 10*3/uL (ref 0.1–0.9)
MONO%: 6.7 % (ref 0.0–13.0)
NEUT#: 2.4 10*3/uL (ref 1.5–6.5)
Platelets: 608 10*3/uL — ABNORMAL HIGH (ref 145–400)
RBC: 3.09 10*6/uL — ABNORMAL LOW (ref 3.70–5.32)
RDW: 14.1 % (ref 11.3–14.5)
WBC: 4.8 10*3/uL (ref 3.9–10.0)

## 2008-02-08 ENCOUNTER — Ambulatory Visit: Payer: Self-pay | Admitting: Oncology

## 2008-02-17 LAB — CBC WITH DIFFERENTIAL/PLATELET
BASO%: 0.9 % (ref 0.0–2.0)
Eosinophils Absolute: 0 10*3/uL (ref 0.0–0.5)
HCT: 35.5 % (ref 34.8–46.6)
MCHC: 33.4 g/dL (ref 32.0–36.0)
MONO#: 0.3 10*3/uL (ref 0.1–0.9)
NEUT#: 1.9 10*3/uL (ref 1.5–6.5)
RBC: 3.27 10*6/uL — ABNORMAL LOW (ref 3.70–5.32)
WBC: 4.2 10*3/uL (ref 3.9–10.0)
lymph#: 1.9 10*3/uL (ref 0.9–3.3)

## 2008-03-31 ENCOUNTER — Ambulatory Visit: Payer: Self-pay | Admitting: Oncology

## 2008-04-13 LAB — CBC WITH DIFFERENTIAL/PLATELET
BASO%: 0.5 % (ref 0.0–2.0)
Basophils Absolute: 0 10*3/uL (ref 0.0–0.1)
EOS%: 0.6 % (ref 0.0–7.0)
MCH: 36.8 pg — ABNORMAL HIGH (ref 26.0–34.0)
MCHC: 33.4 g/dL (ref 32.0–36.0)
MCV: 110.2 fL — ABNORMAL HIGH (ref 81.0–101.0)
MONO%: 5.3 % (ref 0.0–13.0)
RBC: 3.32 10*6/uL — ABNORMAL LOW (ref 3.70–5.32)
RDW: 14.6 % — ABNORMAL HIGH (ref 11.3–14.5)
lymph#: 1.8 10*3/uL (ref 0.9–3.3)

## 2008-05-04 LAB — CBC WITH DIFFERENTIAL/PLATELET
BASO%: 0.6 % (ref 0.0–2.0)
Basophils Absolute: 0 10*3/uL (ref 0.0–0.1)
EOS%: 0.7 % (ref 0.0–7.0)
HCT: 33.8 % — ABNORMAL LOW (ref 34.8–46.6)
HGB: 11.5 g/dL — ABNORMAL LOW (ref 11.6–15.9)
LYMPH%: 51.6 % — ABNORMAL HIGH (ref 14.0–48.0)
MCH: 37.4 pg — ABNORMAL HIGH (ref 26.0–34.0)
MCHC: 34 g/dL (ref 32.0–36.0)
MCV: 110.1 fL — ABNORMAL HIGH (ref 81.0–101.0)
MONO%: 7.5 % (ref 0.0–13.0)
NEUT%: 39.6 % (ref 39.6–76.8)
lymph#: 1.7 10*3/uL (ref 0.9–3.3)

## 2008-05-30 ENCOUNTER — Ambulatory Visit: Payer: Self-pay | Admitting: Oncology

## 2008-06-01 LAB — CBC WITH DIFFERENTIAL/PLATELET
Basophils Absolute: 0 10*3/uL (ref 0.0–0.1)
EOS%: 0.5 % (ref 0.0–7.0)
HCT: 34.9 % (ref 34.8–46.6)
HGB: 11.9 g/dL (ref 11.6–15.9)
MCH: 37.9 pg — ABNORMAL HIGH (ref 26.0–34.0)
MONO#: 0.2 10*3/uL (ref 0.1–0.9)
NEUT%: 38.8 % — ABNORMAL LOW (ref 39.6–76.8)
lymph#: 2.1 10*3/uL (ref 0.9–3.3)

## 2008-06-01 LAB — COMPREHENSIVE METABOLIC PANEL
BUN: 17 mg/dL (ref 6–23)
CO2: 25 mEq/L (ref 19–32)
Calcium: 8.9 mg/dL (ref 8.4–10.5)
Chloride: 104 mEq/L (ref 96–112)
Creatinine, Ser: 1.13 mg/dL (ref 0.40–1.20)
Glucose, Bld: 89 mg/dL (ref 70–99)

## 2008-06-01 LAB — IRON AND TIBC: %SAT: 28 % (ref 20–55)

## 2008-06-01 LAB — FERRITIN: Ferritin: 43 ng/mL (ref 10–291)

## 2008-06-29 LAB — CBC WITH DIFFERENTIAL/PLATELET
BASO%: 1.1 % (ref 0.0–2.0)
Basophils Absolute: 0.1 10*3/uL (ref 0.0–0.1)
HCT: 37.6 % (ref 34.8–46.6)
HGB: 13 g/dL (ref 11.6–15.9)
MCHC: 34.5 g/dL (ref 32.0–36.0)
MONO#: 0.3 10*3/uL (ref 0.1–0.9)
NEUT%: 46.7 % (ref 39.6–76.8)
RDW: 13.9 % (ref 11.3–14.5)
WBC: 4.9 10*3/uL (ref 3.9–10.0)
lymph#: 2.2 10*3/uL (ref 0.9–3.3)

## 2008-07-25 ENCOUNTER — Ambulatory Visit: Payer: Self-pay | Admitting: Oncology

## 2008-07-27 LAB — CBC WITH DIFFERENTIAL/PLATELET
BASO%: 0.3 % (ref 0.0–2.0)
Basophils Absolute: 0 10*3/uL (ref 0.0–0.1)
HCT: 38.2 % (ref 34.8–46.6)
HGB: 12.9 g/dL (ref 11.6–15.9)
MCHC: 33.9 g/dL (ref 32.0–36.0)
MONO#: 0.1 10*3/uL (ref 0.1–0.9)
NEUT#: 1.7 10*3/uL (ref 1.5–6.5)
NEUT%: 44.5 % (ref 39.6–76.8)
WBC: 3.8 10*3/uL — ABNORMAL LOW (ref 3.9–10.0)
lymph#: 2 10*3/uL (ref 0.9–3.3)

## 2008-08-24 LAB — CBC WITH DIFFERENTIAL/PLATELET
BASO%: 0.4 % (ref 0.0–2.0)
Eosinophils Absolute: 0 10*3/uL (ref 0.0–0.5)
MONO#: 0.2 10*3/uL (ref 0.1–0.9)
NEUT#: 3.1 10*3/uL (ref 1.5–6.5)
RBC: 3.32 10*6/uL — ABNORMAL LOW (ref 3.70–5.32)
RDW: 13.8 % (ref 11.3–14.5)
WBC: 5.3 10*3/uL (ref 3.9–10.0)

## 2008-08-24 LAB — TECHNOLOGIST REVIEW

## 2008-09-19 ENCOUNTER — Ambulatory Visit: Payer: Self-pay | Admitting: Oncology

## 2008-09-21 LAB — CBC WITH DIFFERENTIAL/PLATELET
BASO%: 0.5 % (ref 0.0–2.0)
EOS%: 0.7 % (ref 0.0–7.0)
MCH: 37.9 pg — ABNORMAL HIGH (ref 25.1–34.0)
MCHC: 33.7 g/dL (ref 31.5–36.0)
MCV: 112.4 fL — ABNORMAL HIGH (ref 79.5–101.0)
MONO%: 5 % (ref 0.0–14.0)
NEUT#: 1.2 10*3/uL — ABNORMAL LOW (ref 1.5–6.5)
RBC: 3.26 10*6/uL — ABNORMAL LOW (ref 3.70–5.45)
RDW: 14.8 % — ABNORMAL HIGH (ref 11.2–14.5)

## 2008-11-16 ENCOUNTER — Ambulatory Visit: Payer: Self-pay | Admitting: Oncology

## 2008-11-20 LAB — CBC WITH DIFFERENTIAL/PLATELET
Basophils Absolute: 0 10*3/uL (ref 0.0–0.1)
EOS%: 1.1 % (ref 0.0–7.0)
HGB: 12.1 g/dL (ref 11.6–15.9)
MCH: 37.1 pg — ABNORMAL HIGH (ref 25.1–34.0)
MCV: 111.6 fL — ABNORMAL HIGH (ref 79.5–101.0)
MONO%: 6 % (ref 0.0–14.0)
RDW: 13.9 % (ref 11.2–14.5)

## 2009-01-16 ENCOUNTER — Ambulatory Visit: Payer: Self-pay | Admitting: Oncology

## 2009-01-18 LAB — CBC WITH DIFFERENTIAL/PLATELET
BASO%: 0.8 % (ref 0.0–2.0)
EOS%: 1 % (ref 0.0–7.0)
HCT: 38.4 % (ref 34.8–46.6)
MCH: 35.8 pg — ABNORMAL HIGH (ref 25.1–34.0)
MCHC: 33.6 g/dL (ref 31.5–36.0)
NEUT%: 55 % (ref 38.4–76.8)
lymph#: 2 10*3/uL (ref 0.9–3.3)

## 2009-02-13 LAB — CBC WITH DIFFERENTIAL/PLATELET
BASO%: 1.2 % (ref 0.0–2.0)
EOS%: 1.2 % (ref 0.0–7.0)
LYMPH%: 44.5 % (ref 14.0–49.7)
MCH: 36.1 pg — ABNORMAL HIGH (ref 25.1–34.0)
MCHC: 33.7 g/dL (ref 31.5–36.0)
MCV: 107.2 fL — ABNORMAL HIGH (ref 79.5–101.0)
MONO%: 6.4 % (ref 0.0–14.0)
Platelets: 528 10*3/uL — ABNORMAL HIGH (ref 145–400)
RBC: 3.21 10*6/uL — ABNORMAL LOW (ref 3.70–5.45)
nRBC: 0 % (ref 0–0)

## 2009-03-13 ENCOUNTER — Ambulatory Visit: Payer: Self-pay | Admitting: Oncology

## 2009-03-15 LAB — CBC WITH DIFFERENTIAL/PLATELET
EOS%: 1 % (ref 0.0–7.0)
Eosinophils Absolute: 0 10*3/uL (ref 0.0–0.5)
MCH: 36.7 pg — ABNORMAL HIGH (ref 25.1–34.0)
MCV: 110.5 fL — ABNORMAL HIGH (ref 79.5–101.0)
MONO%: 4.7 % (ref 0.0–14.0)
NEUT#: 2.5 10*3/uL (ref 1.5–6.5)
RBC: 3.22 10*6/uL — ABNORMAL LOW (ref 3.70–5.45)
RDW: 15.7 % — ABNORMAL HIGH (ref 11.2–14.5)
lymph#: 1.7 10*3/uL (ref 0.9–3.3)

## 2009-03-15 LAB — COMPREHENSIVE METABOLIC PANEL
AST: 13 U/L (ref 0–37)
Albumin: 4.2 g/dL (ref 3.5–5.2)
Alkaline Phosphatase: 63 U/L (ref 39–117)
Chloride: 105 mEq/L (ref 96–112)
Potassium: 4.1 mEq/L (ref 3.5–5.3)
Sodium: 141 mEq/L (ref 135–145)
Total Protein: 6.9 g/dL (ref 6.0–8.3)

## 2009-03-29 LAB — CBC WITH DIFFERENTIAL/PLATELET
BASO%: 0.4 % (ref 0.0–2.0)
EOS%: 0.7 % (ref 0.0–7.0)
LYMPH%: 39.8 % (ref 14.0–49.7)
MCH: 37.2 pg — ABNORMAL HIGH (ref 25.1–34.0)
MCHC: 33.7 g/dL (ref 31.5–36.0)
MONO#: 0.2 10*3/uL (ref 0.1–0.9)
RBC: 3.02 10*6/uL — ABNORMAL LOW (ref 3.70–5.45)
WBC: 5.4 10*3/uL (ref 3.9–10.3)
lymph#: 2.2 10*3/uL (ref 0.9–3.3)

## 2009-04-25 ENCOUNTER — Ambulatory Visit: Payer: Self-pay | Admitting: Oncology

## 2009-04-25 LAB — CBC WITH DIFFERENTIAL/PLATELET
BASO%: 0.5 % (ref 0.0–2.0)
EOS%: 1.5 % (ref 0.0–7.0)
HCT: 36.3 % (ref 34.8–46.6)
HGB: 12.3 g/dL (ref 11.6–15.9)
MCHC: 33.9 g/dL (ref 31.5–36.0)
MONO#: 0.4 10*3/uL (ref 0.1–0.9)
NEUT%: 29.5 % — ABNORMAL LOW (ref 38.4–76.8)
RDW: 14.3 % (ref 11.2–14.5)
WBC: 3.9 10*3/uL (ref 3.9–10.3)
lymph#: 2.3 10*3/uL (ref 0.9–3.3)

## 2009-05-03 LAB — CBC WITH DIFFERENTIAL/PLATELET
Basophils Absolute: 0 10*3/uL (ref 0.0–0.1)
EOS%: 0.8 % (ref 0.0–7.0)
HGB: 11.5 g/dL — ABNORMAL LOW (ref 11.6–15.9)
LYMPH%: 48.1 % (ref 14.0–49.7)
MCH: 36.4 pg — ABNORMAL HIGH (ref 25.1–34.0)
MCV: 109.2 fL — ABNORMAL HIGH (ref 79.5–101.0)
MONO%: 6.3 % (ref 0.0–14.0)
NEUT%: 44.2 % (ref 38.4–76.8)
Platelets: 551 10*3/uL — ABNORMAL HIGH (ref 145–400)
RDW: 13.8 % (ref 11.2–14.5)

## 2009-05-24 LAB — CBC WITH DIFFERENTIAL/PLATELET
BASO%: 0.5 % (ref 0.0–2.0)
LYMPH%: 45.7 % (ref 14.0–49.7)
MCH: 37.3 pg — ABNORMAL HIGH (ref 25.1–34.0)
MCHC: 33 g/dL (ref 31.5–36.0)
MCV: 113.1 fL — ABNORMAL HIGH (ref 79.5–101.0)
MONO%: 9.2 % (ref 0.0–14.0)
NEUT%: 43.6 % (ref 38.4–76.8)
Platelets: 533 10*3/uL — ABNORMAL HIGH (ref 145–400)
RBC: 3.04 10*6/uL — ABNORMAL LOW (ref 3.70–5.45)
WBC: 5.1 10*3/uL (ref 3.9–10.3)

## 2009-06-15 ENCOUNTER — Ambulatory Visit: Payer: Self-pay | Admitting: Oncology

## 2009-06-19 LAB — CBC WITH DIFFERENTIAL/PLATELET
Basophils Absolute: 0 10*3/uL (ref 0.0–0.1)
Eosinophils Absolute: 0 10*3/uL (ref 0.0–0.5)
HCT: 34.4 % — ABNORMAL LOW (ref 34.8–46.6)
HGB: 11.4 g/dL — ABNORMAL LOW (ref 11.6–15.9)
LYMPH%: 48.8 % (ref 14.0–49.7)
MCV: 109.2 fL — ABNORMAL HIGH (ref 79.5–101.0)
MONO#: 0.4 10*3/uL (ref 0.1–0.9)
NEUT#: 2 10*3/uL (ref 1.5–6.5)
Platelets: 719 10*3/uL — ABNORMAL HIGH (ref 145–400)
RBC: 3.15 10*6/uL — ABNORMAL LOW (ref 3.70–5.45)
WBC: 4.7 10*3/uL (ref 3.9–10.3)
nRBC: 0 % (ref 0–0)

## 2009-06-19 LAB — COMPREHENSIVE METABOLIC PANEL
ALT: 37 U/L — ABNORMAL HIGH (ref 0–35)
AST: 29 U/L (ref 0–37)
Calcium: 9.6 mg/dL (ref 8.4–10.5)
Chloride: 105 mEq/L (ref 96–112)
Creatinine, Ser: 1.05 mg/dL (ref 0.40–1.20)
Potassium: 4.8 mEq/L (ref 3.5–5.3)

## 2009-07-13 ENCOUNTER — Ambulatory Visit: Payer: Self-pay | Admitting: Oncology

## 2009-07-17 LAB — CBC WITH DIFFERENTIAL/PLATELET
Basophils Absolute: 0 10*3/uL (ref 0.0–0.1)
Eosinophils Absolute: 0 10*3/uL (ref 0.0–0.5)
HGB: 12.1 g/dL (ref 11.6–15.9)
MONO#: 0.4 10*3/uL (ref 0.1–0.9)
MONO%: 7.3 % (ref 0.0–14.0)
NEUT#: 2.5 10*3/uL (ref 1.5–6.5)
RBC: 3.23 10*6/uL — ABNORMAL LOW (ref 3.70–5.45)
RDW: 14.9 % — ABNORMAL HIGH (ref 11.2–14.5)
WBC: 5.2 10*3/uL (ref 3.9–10.3)
lymph#: 2.3 10*3/uL (ref 0.9–3.3)

## 2009-08-14 ENCOUNTER — Ambulatory Visit: Payer: Self-pay | Admitting: Oncology

## 2009-08-16 LAB — CBC WITH DIFFERENTIAL/PLATELET
BASO%: 0.4 % (ref 0.0–2.0)
Basophils Absolute: 0 10*3/uL (ref 0.0–0.1)
EOS%: 1.3 % (ref 0.0–7.0)
Eosinophils Absolute: 0.1 10*3/uL (ref 0.0–0.5)
HCT: 38.7 % (ref 34.8–46.6)
HGB: 12.8 g/dL (ref 11.6–15.9)
LYMPH%: 41.4 % (ref 14.0–49.7)
MCH: 35.8 pg — ABNORMAL HIGH (ref 25.1–34.0)
MCHC: 33.1 g/dL (ref 31.5–36.0)
MCV: 108.1 fL — ABNORMAL HIGH (ref 79.5–101.0)
MONO#: 0.4 10*3/uL (ref 0.1–0.9)
MONO%: 7.6 % (ref 0.0–14.0)
NEUT#: 2.7 10*3/uL (ref 1.5–6.5)
NEUT%: 49.3 % (ref 38.4–76.8)
Platelets: 902 10*3/uL — ABNORMAL HIGH (ref 145–400)
RBC: 3.58 10*6/uL — ABNORMAL LOW (ref 3.70–5.45)
RDW: 13.8 % (ref 11.2–14.5)
WBC: 5.4 10*3/uL (ref 3.9–10.3)
lymph#: 2.2 10*3/uL (ref 0.9–3.3)
nRBC: 0 % (ref 0–0)

## 2009-08-30 LAB — CBC WITH DIFFERENTIAL/PLATELET
Eosinophils Absolute: 0 10*3/uL (ref 0.0–0.5)
MONO#: 0.3 10*3/uL (ref 0.1–0.9)
NEUT#: 2.8 10*3/uL (ref 1.5–6.5)
Platelets: 804 10*3/uL — ABNORMAL HIGH (ref 145–400)
RBC: 3.34 10*6/uL — ABNORMAL LOW (ref 3.70–5.45)
RDW: 14.2 % (ref 11.2–14.5)
WBC: 5.9 10*3/uL (ref 3.9–10.3)
nRBC: 0 % (ref 0–0)

## 2009-09-11 ENCOUNTER — Ambulatory Visit: Payer: Self-pay | Admitting: Oncology

## 2009-10-16 ENCOUNTER — Ambulatory Visit: Payer: Self-pay | Admitting: Oncology

## 2009-11-05 LAB — CBC WITH DIFFERENTIAL/PLATELET
Eosinophils Absolute: 0 10*3/uL (ref 0.0–0.5)
MONO#: 0.4 10*3/uL (ref 0.1–0.9)
MONO%: 8 % (ref 0.0–14.0)
NEUT#: 1.9 10*3/uL (ref 1.5–6.5)
RBC: 3.32 10*6/uL — ABNORMAL LOW (ref 3.70–5.45)
RDW: 15.6 % — ABNORMAL HIGH (ref 11.2–14.5)
WBC: 4.4 10*3/uL (ref 3.9–10.3)
lymph#: 2.1 10*3/uL (ref 0.9–3.3)
nRBC: 0 % (ref 0–0)

## 2009-11-07 LAB — COMPREHENSIVE METABOLIC PANEL
ALT: 19 U/L (ref 0–35)
AST: 18 U/L (ref 0–37)
Calcium: 9.4 mg/dL (ref 8.4–10.5)
Chloride: 101 mEq/L (ref 96–112)
Creatinine, Ser: 1.21 mg/dL — ABNORMAL HIGH (ref 0.40–1.20)

## 2009-11-07 LAB — FERRITIN: Ferritin: 114 ng/mL (ref 10–291)

## 2009-11-07 LAB — IRON AND TIBC: %SAT: 30 % (ref 20–55)

## 2009-11-07 LAB — LACTATE DEHYDROGENASE: LDH: 221 U/L (ref 94–250)

## 2009-12-05 ENCOUNTER — Ambulatory Visit: Payer: Self-pay | Admitting: Oncology

## 2009-12-11 LAB — CBC WITH DIFFERENTIAL/PLATELET
BASO%: 0.9 % (ref 0.0–2.0)
EOS%: 0.9 % (ref 0.0–7.0)
MCH: 35.3 pg — ABNORMAL HIGH (ref 25.1–34.0)
MCHC: 33 g/dL (ref 31.5–36.0)
RDW: 15.9 % — ABNORMAL HIGH (ref 11.2–14.5)
WBC: 4.5 10*3/uL (ref 3.9–10.3)
lymph#: 2.2 10*3/uL (ref 0.9–3.3)
nRBC: 0 % (ref 0–0)

## 2010-01-08 ENCOUNTER — Ambulatory Visit: Payer: Self-pay | Admitting: Oncology

## 2010-01-10 LAB — CBC WITH DIFFERENTIAL/PLATELET
BASO%: 0.9 % (ref 0.0–2.0)
LYMPH%: 52.8 % — ABNORMAL HIGH (ref 14.0–49.7)
MCHC: 32.9 g/dL (ref 31.5–36.0)
MCV: 106.5 fL — ABNORMAL HIGH (ref 79.5–101.0)
MONO#: 0.4 10*3/uL (ref 0.1–0.9)
MONO%: 8.6 % (ref 0.0–14.0)
Platelets: 593 10*3/uL — ABNORMAL HIGH (ref 145–400)
RBC: 2.94 10*6/uL — ABNORMAL LOW (ref 3.70–5.45)
RDW: 15.4 % — ABNORMAL HIGH (ref 11.2–14.5)
WBC: 4.3 10*3/uL (ref 3.9–10.3)
nRBC: 0 % (ref 0–0)

## 2010-03-05 ENCOUNTER — Ambulatory Visit: Payer: Self-pay | Admitting: Oncology

## 2010-03-07 LAB — CBC WITH DIFFERENTIAL/PLATELET
BASO%: 0.6 % (ref 0.0–2.0)
Basophils Absolute: 0 10*3/uL (ref 0.0–0.1)
EOS%: 0.6 % (ref 0.0–7.0)
Eosinophils Absolute: 0 10*3/uL (ref 0.0–0.5)
HCT: 32.8 % — ABNORMAL LOW (ref 34.8–46.6)
HGB: 10.6 g/dL — ABNORMAL LOW (ref 11.6–15.9)
LYMPH%: 48.1 % (ref 14.0–49.7)
MCH: 34 pg (ref 25.1–34.0)
MCHC: 32.3 g/dL (ref 31.5–36.0)
MCV: 105.1 fL — ABNORMAL HIGH (ref 79.5–101.0)
MONO#: 0.2 10*3/uL (ref 0.1–0.9)
MONO%: 5.6 % (ref 0.0–14.0)
NEUT#: 1.5 10*3/uL (ref 1.5–6.5)
NEUT%: 45.1 % (ref 38.4–76.8)
Platelets: 550 10*3/uL — ABNORMAL HIGH (ref 145–400)
RBC: 3.12 10*6/uL — ABNORMAL LOW (ref 3.70–5.45)
RDW: 15.1 % — ABNORMAL HIGH (ref 11.2–14.5)
WBC: 3.2 10*3/uL — ABNORMAL LOW (ref 3.9–10.3)
lymph#: 1.6 10*3/uL (ref 0.9–3.3)
nRBC: 0 % (ref 0–0)

## 2010-03-07 LAB — COMPREHENSIVE METABOLIC PANEL
ALT: 24 U/L (ref 0–35)
AST: 23 U/L (ref 0–37)
Albumin: 3.8 g/dL (ref 3.5–5.2)
Alkaline Phosphatase: 89 U/L (ref 39–117)
BUN: 10 mg/dL (ref 6–23)
CO2: 28 mEq/L (ref 19–32)
Calcium: 9 mg/dL (ref 8.4–10.5)
Chloride: 109 mEq/L (ref 96–112)
Creatinine, Ser: 1.24 mg/dL — ABNORMAL HIGH (ref 0.40–1.20)
Glucose, Bld: 112 mg/dL — ABNORMAL HIGH (ref 70–99)
Potassium: 3.7 mEq/L (ref 3.5–5.3)
Sodium: 143 mEq/L (ref 135–145)
Total Bilirubin: 0.6 mg/dL (ref 0.3–1.2)
Total Protein: 6.6 g/dL (ref 6.0–8.3)

## 2010-03-07 LAB — LACTATE DEHYDROGENASE: LDH: 208 U/L (ref 94–250)

## 2010-03-08 LAB — FERRITIN: Ferritin: 49 ng/mL (ref 10–291)

## 2010-03-08 LAB — IRON AND TIBC
%SAT: 19 % — ABNORMAL LOW (ref 20–55)
Iron: 64 ug/dL (ref 42–145)
TIBC: 329 ug/dL (ref 250–470)
UIBC: 265 ug/dL

## 2010-05-01 ENCOUNTER — Ambulatory Visit: Payer: Self-pay | Admitting: Oncology

## 2010-05-01 LAB — CBC WITH DIFFERENTIAL/PLATELET
BASO%: 0.3 % (ref 0.0–2.0)
Basophils Absolute: 0 10*3/uL (ref 0.0–0.1)
EOS%: 0.8 % (ref 0.0–7.0)
Eosinophils Absolute: 0 10*3/uL (ref 0.0–0.5)
HCT: 34.6 % — ABNORMAL LOW (ref 34.8–46.6)
HGB: 11.4 g/dL — ABNORMAL LOW (ref 11.6–15.9)
LYMPH%: 49.9 % — ABNORMAL HIGH (ref 14.0–49.7)
MCH: 35 pg — ABNORMAL HIGH (ref 25.1–34.0)
MCHC: 32.9 g/dL (ref 31.5–36.0)
MCV: 106.1 fL — ABNORMAL HIGH (ref 79.5–101.0)
MONO#: 0.4 10*3/uL (ref 0.1–0.9)
MONO%: 9.3 % (ref 0.0–14.0)
NEUT#: 1.5 10*3/uL (ref 1.5–6.5)
NEUT%: 39.7 % (ref 38.4–76.8)
Platelets: 507 10*3/uL — ABNORMAL HIGH (ref 145–400)
RBC: 3.26 10*6/uL — ABNORMAL LOW (ref 3.70–5.45)
RDW: 16.4 % — ABNORMAL HIGH (ref 11.2–14.5)
WBC: 3.8 10*3/uL — ABNORMAL LOW (ref 3.9–10.3)
lymph#: 1.9 10*3/uL (ref 0.9–3.3)
nRBC: 0 % (ref 0–0)

## 2010-07-01 ENCOUNTER — Ambulatory Visit: Payer: Self-pay | Admitting: Oncology

## 2010-07-10 LAB — CBC WITH DIFFERENTIAL/PLATELET
BASO%: 0.8 % (ref 0.0–2.0)
HCT: 36 % (ref 34.8–46.6)
LYMPH%: 53.1 % — ABNORMAL HIGH (ref 14.0–49.7)
MCH: 34.9 pg — ABNORMAL HIGH (ref 25.1–34.0)
MCHC: 32.8 g/dL (ref 31.5–36.0)
MCV: 106.5 fL — ABNORMAL HIGH (ref 79.5–101.0)
MONO#: 0.4 10*3/uL (ref 0.1–0.9)
MONO%: 7.6 % (ref 0.0–14.0)
NEUT%: 37.7 % — ABNORMAL LOW (ref 38.4–76.8)
Platelets: 673 10*3/uL — ABNORMAL HIGH (ref 145–400)
WBC: 4.7 10*3/uL (ref 3.9–10.3)

## 2010-08-19 ENCOUNTER — Ambulatory Visit: Payer: Self-pay | Admitting: Oncology

## 2010-08-21 LAB — CBC WITH DIFFERENTIAL/PLATELET
BASO%: 0.7 % (ref 0.0–2.0)
Basophils Absolute: 0 10*3/uL (ref 0.0–0.1)
EOS%: 0.5 % (ref 0.0–7.0)
Eosinophils Absolute: 0 10*3/uL (ref 0.0–0.5)
HCT: 35.6 % (ref 34.8–46.6)
HGB: 11.7 g/dL (ref 11.6–15.9)
LYMPH%: 44.8 % (ref 14.0–49.7)
MCH: 36.2 pg — ABNORMAL HIGH (ref 25.1–34.0)
MCHC: 32.9 g/dL (ref 31.5–36.0)
MCV: 110 fL — ABNORMAL HIGH (ref 79.5–101.0)
MONO#: 0.3 10*3/uL (ref 0.1–0.9)
MONO%: 7 % (ref 0.0–14.0)
NEUT#: 2 10*3/uL (ref 1.5–6.5)
NEUT%: 47 % (ref 38.4–76.8)
Platelets: 611 10*3/uL — ABNORMAL HIGH (ref 145–400)
RBC: 3.24 10*6/uL — ABNORMAL LOW (ref 3.70–5.45)
RDW: 15.1 % — ABNORMAL HIGH (ref 11.2–14.5)
WBC: 4.2 10*3/uL (ref 3.9–10.3)
lymph#: 1.9 10*3/uL (ref 0.9–3.3)

## 2010-08-21 LAB — FERRITIN: Ferritin: 83 ng/mL (ref 10–291)

## 2010-10-09 ENCOUNTER — Encounter (HOSPITAL_BASED_OUTPATIENT_CLINIC_OR_DEPARTMENT_OTHER): Payer: Federal, State, Local not specified - PPO | Admitting: Oncology

## 2010-10-09 ENCOUNTER — Other Ambulatory Visit (HOSPITAL_COMMUNITY): Payer: Self-pay | Admitting: Oncology

## 2010-10-09 DIAGNOSIS — D47Z9 Other specified neoplasms of uncertain behavior of lymphoid, hematopoietic and related tissue: Secondary | ICD-10-CM

## 2010-10-09 LAB — CBC WITH DIFFERENTIAL/PLATELET
Basophils Absolute: 0 10*3/uL (ref 0.0–0.1)
EOS%: 0.9 % (ref 0.0–7.0)
HGB: 11.7 g/dL (ref 11.6–15.9)
MCH: 35.2 pg — ABNORMAL HIGH (ref 25.1–34.0)
MCHC: 33.4 g/dL (ref 31.5–36.0)
MCV: 105.4 fL — ABNORMAL HIGH (ref 79.5–101.0)
MONO%: 7.4 % (ref 0.0–14.0)
NEUT%: 44.1 % (ref 38.4–76.8)
RDW: 14.3 % (ref 11.2–14.5)

## 2010-10-16 LAB — COMPREHENSIVE METABOLIC PANEL
ALT: 11 U/L (ref 0–35)
CO2: 28 mEq/L (ref 19–32)
Calcium: 9.2 mg/dL (ref 8.4–10.5)
Chloride: 105 mEq/L (ref 96–112)
Creatinine, Ser: 1.1 mg/dL (ref 0.40–1.20)
Glucose, Bld: 92 mg/dL (ref 70–99)
Total Bilirubin: 0.3 mg/dL (ref 0.3–1.2)

## 2010-10-16 LAB — IRON AND TIBC
Iron: 106 ug/dL (ref 42–145)
UIBC: 247 ug/dL

## 2010-10-16 LAB — FERRITIN: Ferritin: 91 ng/mL (ref 10–291)

## 2010-10-16 LAB — LACTATE DEHYDROGENASE: LDH: 242 U/L (ref 94–250)

## 2010-12-13 NOTE — Op Note (Signed)
[name], [name]               ACCOUNT NO.:  [id number]   MEDICAL RECORD NO.:  [id number]          PATIENT TYPE:  AMB   LOCATION:  SDC                           FACILITY:  [facility]   PHYSICIAN:  Lenoard Aden, M.D.DATE OF BIRTH:  [birth year]   DATE OF PROCEDURE:  05/21/2004  DATE OF DISCHARGE:                                 OPERATIVE REPORT   PREOPERATIVE DIAGNOSES:  1.  Perimenopausal dysfunctional uterine bleeding.  2.  Endometrial polyp on saline sonohysterography.   POSTOPERATIVE DIAGNOSES:  1.  Perimenopausal dysfunctional uterine bleeding.  2.  Endometrial polyp on saline sonohysterography.   PROCEDURE:  Diagnostic hysteroscopy, resectoscopic polypectomy, D&C.   SURGEON:  Lenoard Aden, M.D.   ANESTHESIA:  General.   ESTIMATED BLOOD LOSS:  Less than 50 mL.   COMPLICATIONS:  None.   DRAINS:  None.   COUNTS:  Correct.   DISPOSITION:  Patient to recovery in good condition.   SPECIMENS:  D&C endometrial curettings and polypoid fragments to pathology.   DESCRIPTION OF PROCEDURE:  After being apprised of the risks of anesthesia,  infection, bleeding, uterine perforation and possible need for repair, the  patient was brought to the operating room where she was administered a  general anesthetic without complications, prepped and draped in the usual  sterile fashion, catheterized until the bladder was empty.  Examination  under anesthesia reveals a bulky anteflexed uterus and no adnexal after  achieving adequate anesthesia.  A single tooth tenaculum used to grasp the  anterior lip of the cervix, dilute Pitressin solution placed at 3 and 9  o'clock at the cervicovaginal junction, total of 16 mL.  No evidence of  intravascular entry at this time. The cervix is sounded to 10 cm, uterus is  sounded to 10 cm and cervix is dilated easily up to a #31 Pratt dilator  atraumatically.  Hysteroscope placed, visualization reveals thickening of  the anterior wall consistent  with a sessile anterior wall endometrial polyp  and posterior wall thickening, bilateral tubal ostia noted. These areas are  resected using the right angled loop and multiple passes to obtain polypoid  specimens from the anterior and posterior wall.  Good hemostasis is noted. Endometrial curettings are collected using a sharp  curette. Good hemostasis is noted, revisualization reveals no evidence of  structural defects within the endometrial cavity.  All instruments removed,  fluid deficit of 40 mL noted. The patient is awakened and transferred to  recovery in good condition.      RJT/MEDQ  D:  05/21/2004  T:  05/21/2004  Job:  [id number]

## 2010-12-13 NOTE — H&P (Signed)
[name], [name]               ACCOUNT NO.:  [id number]   MEDICAL RECORD NO.:  [id number]          PATIENT TYPE:  AMB   LOCATION:  SDC                           [facility]ILITY:  [facility]   PHYSICIAN:  Lenoard Aden, M.D.DATE OF BIRTH:  [birth year]   DATE OF ADMISSION:  05/21/2004  DATE OF DISCHARGE:                                HISTORY & PHYSICAL   CHIEF COMPLAINT:  Dysfunctional uterine bleeding.   HISTORY OF PRESENT ILLNESS:  The patient is a 45 year old white female, G4,  P3, history of tubal ligation in 2003, who presents for hysteroscopy D&C due  to persistent dysfunctional uterine bleeding and questionable findings with  a polypoid mass on __________ hysterography.  She has a history of two  uncomplicated vaginal deliveries, C-section, and tubal ligation.   MEDICATIONS:  To previously include and currently include Paxil.   ALLERGIES:  She has no known drug allergies.   FAMILY HISTORY:  Noncontributory.   SOCIAL HISTORY:  Noncontributory.   PHYSICAL EXAMINATION:  GENERAL:  Well-developed, well-nourished African-  American female in no acute distress.  HEENT:  Normal.  LUNGS:  Clear.  HEART:  Regular rate and rhythm.  ABDOMEN:  Soft, nontender.  PELVIC:  An anteflexed uterus, no adnexal masses.  EXTREMITIES:  No cords.  NEUROLOGIC:  Nonfocal.   IMPRESSION:  Perimenopausal dysfunctional uterine bleeding.  Rule out  endometrial hyperplasia versus endometrial neoplasia.   PLAN:  Diagnostic hysteroscopy D&C.  The risks of anesthesia, infection,  bleeding, intra-abdominal organ injury, __________ discussed, possible  uterine perforation with need for repair noted.  The patient acknowledges,  wishes to proceed.      RJT/MEDQ  D:  05/20/2004  T:  05/20/2004  Job:  [id number]

## 2010-12-18 ENCOUNTER — Encounter (HOSPITAL_BASED_OUTPATIENT_CLINIC_OR_DEPARTMENT_OTHER): Payer: Federal, State, Local not specified - PPO | Admitting: Oncology

## 2010-12-18 ENCOUNTER — Other Ambulatory Visit (HOSPITAL_COMMUNITY): Payer: Self-pay | Admitting: Oncology

## 2010-12-18 DIAGNOSIS — D47Z9 Other specified neoplasms of uncertain behavior of lymphoid, hematopoietic and related tissue: Secondary | ICD-10-CM

## 2010-12-18 LAB — CBC WITH DIFFERENTIAL/PLATELET
BASO%: 0.9 % (ref 0.0–2.0)
EOS%: 0.4 % (ref 0.0–7.0)
HCT: 35.9 % (ref 34.8–46.6)
HGB: 12 g/dL (ref 11.6–15.9)
MCH: 34.5 pg — ABNORMAL HIGH (ref 25.1–34.0)
MCHC: 33.4 g/dL (ref 31.5–36.0)
MONO#: 0.2 10*3/uL (ref 0.1–0.9)
NEUT%: 46.3 % (ref 38.4–76.8)
RDW: 14.4 % (ref 11.2–14.5)
WBC: 4.5 10*3/uL (ref 3.9–10.3)
lymph#: 2.2 10*3/uL (ref 0.9–3.3)

## 2011-04-28 ENCOUNTER — Other Ambulatory Visit (HOSPITAL_COMMUNITY): Payer: Self-pay | Admitting: Oncology

## 2011-04-28 ENCOUNTER — Encounter (HOSPITAL_BASED_OUTPATIENT_CLINIC_OR_DEPARTMENT_OTHER): Payer: Federal, State, Local not specified - PPO | Admitting: Oncology

## 2011-04-28 DIAGNOSIS — D47Z9 Other specified neoplasms of uncertain behavior of lymphoid, hematopoietic and related tissue: Secondary | ICD-10-CM

## 2011-04-28 LAB — CBC WITH DIFFERENTIAL/PLATELET
BASO%: 0.6 % (ref 0.0–2.0)
LYMPH%: 54.1 % — ABNORMAL HIGH (ref 14.0–49.7)
MCHC: 33.6 g/dL (ref 31.5–36.0)
MCV: 106.8 fL — ABNORMAL HIGH (ref 79.5–101.0)
MONO%: 5 % (ref 0.0–14.0)
NEUT#: 1.4 10*3/uL — ABNORMAL LOW (ref 1.5–6.5)
Platelets: 534 10*3/uL — ABNORMAL HIGH (ref 145–400)
RBC: 3.37 10*6/uL — ABNORMAL LOW (ref 3.70–5.45)
RDW: 17.3 % — ABNORMAL HIGH (ref 11.2–14.5)
WBC: 3.5 10*3/uL — ABNORMAL LOW (ref 3.9–10.3)

## 2011-05-03 LAB — COMPREHENSIVE METABOLIC PANEL
BUN: 10 mg/dL (ref 6–23)
CO2: 28 mEq/L (ref 19–32)
Calcium: 9.6 mg/dL (ref 8.4–10.5)
Chloride: 100 mEq/L (ref 96–112)
Creatinine, Ser: 1.22 mg/dL — ABNORMAL HIGH (ref 0.50–1.10)
Glucose, Bld: 106 mg/dL — ABNORMAL HIGH (ref 70–99)

## 2011-05-03 LAB — IRON AND TIBC
Iron: 90 ug/dL (ref 42–145)
UIBC: 276 ug/dL (ref 125–400)

## 2011-05-03 LAB — LACTATE DEHYDROGENASE: LDH: 243 U/L (ref 94–250)

## 2011-07-02 ENCOUNTER — Other Ambulatory Visit (HOSPITAL_BASED_OUTPATIENT_CLINIC_OR_DEPARTMENT_OTHER): Payer: Federal, State, Local not specified - PPO

## 2011-07-02 ENCOUNTER — Other Ambulatory Visit (HOSPITAL_COMMUNITY): Payer: Self-pay | Admitting: Oncology

## 2011-07-02 DIAGNOSIS — D47Z9 Other specified neoplasms of uncertain behavior of lymphoid, hematopoietic and related tissue: Secondary | ICD-10-CM

## 2011-07-02 LAB — CBC WITH DIFFERENTIAL/PLATELET
BASO%: 0.7 % (ref 0.0–2.0)
Basophils Absolute: 0 10*3/uL (ref 0.0–0.1)
HCT: 35.1 % (ref 34.8–46.6)
HGB: 11.6 g/dL (ref 11.6–15.9)
LYMPH%: 54.4 % — ABNORMAL HIGH (ref 14.0–49.7)
MCHC: 33 g/dL (ref 31.5–36.0)
MONO#: 0.2 10*3/uL (ref 0.1–0.9)
NEUT%: 40 % (ref 38.4–76.8)
Platelets: 666 10*3/uL — ABNORMAL HIGH (ref 145–400)
WBC: 4.3 10*3/uL (ref 3.9–10.3)

## 2011-09-06 ENCOUNTER — Telehealth: Payer: Self-pay | Admitting: Oncology

## 2011-09-06 NOTE — Telephone Encounter (Signed)
l/m and mailed apptd for 2/13 and 4/13 per pof from 05/08/11 aom

## 2011-09-10 ENCOUNTER — Telehealth: Payer: Self-pay | Admitting: Oncology

## 2011-09-10 NOTE — Telephone Encounter (Signed)
pt called to change her 2/21 appt to 2/20  aom

## 2011-09-17 ENCOUNTER — Other Ambulatory Visit: Payer: Federal, State, Local not specified - PPO | Admitting: Lab

## 2011-09-17 ENCOUNTER — Telehealth: Payer: Self-pay | Admitting: Oncology

## 2011-09-17 NOTE — Telephone Encounter (Signed)
Pt called and r/s lab for today and r/s lab to 2/17, called RN, left message regarding lab

## 2011-09-18 ENCOUNTER — Other Ambulatory Visit: Payer: Federal, State, Local not specified - PPO

## 2011-09-24 ENCOUNTER — Other Ambulatory Visit (HOSPITAL_BASED_OUTPATIENT_CLINIC_OR_DEPARTMENT_OTHER): Payer: Federal, State, Local not specified - PPO | Admitting: Lab

## 2011-09-24 DIAGNOSIS — D47Z9 Other specified neoplasms of uncertain behavior of lymphoid, hematopoietic and related tissue: Secondary | ICD-10-CM

## 2011-09-24 LAB — CBC WITH DIFFERENTIAL/PLATELET
Basophils Absolute: 0 10*3/uL (ref 0.0–0.1)
EOS%: 1 % (ref 0.0–7.0)
Eosinophils Absolute: 0.1 10*3/uL (ref 0.0–0.5)
HCT: 33.4 % — ABNORMAL LOW (ref 34.8–46.6)
HGB: 10.9 g/dL — ABNORMAL LOW (ref 11.6–15.9)
LYMPH%: 48.2 % (ref 14.0–49.7)
MCH: 33.1 pg (ref 25.1–34.0)
MCV: 101.5 fL — ABNORMAL HIGH (ref 79.5–101.0)
MONO%: 5 % (ref 0.0–14.0)
NEUT%: 45.2 % (ref 38.4–76.8)
Platelets: 653 10*3/uL — ABNORMAL HIGH (ref 145–400)
RDW: 15.5 % — ABNORMAL HIGH (ref 11.2–14.5)

## 2011-09-24 LAB — COMPREHENSIVE METABOLIC PANEL
Alkaline Phosphatase: 78 U/L (ref 39–117)
BUN: 15 mg/dL (ref 6–23)
CO2: 26 mEq/L (ref 19–32)
Glucose, Bld: 90 mg/dL (ref 70–99)
Total Bilirubin: 0.4 mg/dL (ref 0.3–1.2)

## 2011-09-24 LAB — LACTATE DEHYDROGENASE: LDH: 343 U/L — ABNORMAL HIGH (ref 94–250)

## 2011-09-25 ENCOUNTER — Other Ambulatory Visit: Payer: Self-pay | Admitting: Oncology

## 2011-09-25 DIAGNOSIS — D539 Nutritional anemia, unspecified: Secondary | ICD-10-CM | POA: Insufficient documentation

## 2011-09-25 DIAGNOSIS — D473 Essential (hemorrhagic) thrombocythemia: Secondary | ICD-10-CM | POA: Insufficient documentation

## 2011-09-29 ENCOUNTER — Encounter: Payer: Self-pay | Admitting: Medical Oncology

## 2011-09-29 NOTE — Progress Notes (Signed)
I spoke with pt to verify her dose of hydrea and anagrelide. Pt states she has not taken the anagrelide since her visit with Dr. Arline Asp but she is taking hydrea. She is taking Hydrea 1000mg  5 days per week and 1500mg  x 2 days.Dr. Arline Asp notified and no changes. Stressed to take her meds as directed and keep her lab appointments.

## 2011-10-30 ENCOUNTER — Ambulatory Visit: Payer: Federal, State, Local not specified - PPO | Admitting: Physician Assistant

## 2011-10-30 ENCOUNTER — Telehealth: Payer: Self-pay | Admitting: Oncology

## 2011-10-30 ENCOUNTER — Other Ambulatory Visit: Payer: Federal, State, Local not specified - PPO

## 2011-10-30 NOTE — Telephone Encounter (Signed)
pt had called late 4/3 and i had left early for the day.  called pt back and cx appt dfor today as she needed a diff. time,advised her to c/b   aom

## 2011-10-31 ENCOUNTER — Telehealth: Payer: Self-pay | Admitting: Oncology

## 2011-10-31 NOTE — Telephone Encounter (Signed)
pt called to r/s 4/4 appt to 4/11     aom

## 2011-11-06 ENCOUNTER — Ambulatory Visit: Payer: Federal, State, Local not specified - PPO | Admitting: Physician Assistant

## 2011-11-06 ENCOUNTER — Other Ambulatory Visit: Payer: Federal, State, Local not specified - PPO | Admitting: Lab

## 2011-11-27 ENCOUNTER — Telehealth: Payer: Self-pay | Admitting: Oncology

## 2011-11-27 NOTE — Telephone Encounter (Signed)
pt called to r/s 4/11 appt to 5/28   aom

## 2011-12-23 ENCOUNTER — Ambulatory Visit: Payer: Federal, State, Local not specified - PPO | Admitting: Physician Assistant

## 2011-12-23 ENCOUNTER — Other Ambulatory Visit: Payer: Federal, State, Local not specified - PPO | Admitting: Lab

## 2012-01-11 ENCOUNTER — Other Ambulatory Visit: Payer: Self-pay | Admitting: Oncology

## 2012-01-11 DIAGNOSIS — D473 Essential (hemorrhagic) thrombocythemia: Secondary | ICD-10-CM

## 2012-01-11 DIAGNOSIS — D539 Nutritional anemia, unspecified: Secondary | ICD-10-CM

## 2012-01-12 ENCOUNTER — Telehealth: Payer: Self-pay | Admitting: *Deleted

## 2012-01-12 NOTE — Telephone Encounter (Signed)
Message left for patient to call to reschedule her missed appts for lab and f/u.  Noted missed appts with Hydrea refill.

## 2012-01-28 ENCOUNTER — Telehealth: Payer: Self-pay | Admitting: Oncology

## 2012-01-28 NOTE — Telephone Encounter (Signed)
called to r/s missed appta from may   aom

## 2012-02-28 ENCOUNTER — Other Ambulatory Visit: Payer: Self-pay | Admitting: Oncology

## 2012-03-01 ENCOUNTER — Other Ambulatory Visit: Payer: Self-pay | Admitting: *Deleted

## 2012-03-01 DIAGNOSIS — D539 Nutritional anemia, unspecified: Secondary | ICD-10-CM

## 2012-03-01 DIAGNOSIS — D473 Essential (hemorrhagic) thrombocythemia: Secondary | ICD-10-CM

## 2012-03-01 MED ORDER — HYDROXYUREA 500 MG PO CAPS: 500.0000 mg | ORAL_CAPSULE | ORAL | Status: DC

## 2012-03-26 ENCOUNTER — Ambulatory Visit (HOSPITAL_BASED_OUTPATIENT_CLINIC_OR_DEPARTMENT_OTHER): Payer: Federal, State, Local not specified - PPO | Admitting: Oncology

## 2012-03-26 ENCOUNTER — Telehealth: Payer: Self-pay | Admitting: Oncology

## 2012-03-26 ENCOUNTER — Encounter: Payer: Self-pay | Admitting: Oncology

## 2012-03-26 ENCOUNTER — Other Ambulatory Visit (HOSPITAL_BASED_OUTPATIENT_CLINIC_OR_DEPARTMENT_OTHER): Payer: Federal, State, Local not specified - PPO | Admitting: Lab

## 2012-03-26 VITALS — BP 131/90 | HR 106 | Temp 97.8°F | Resp 20 | Ht 70.0 in | Wt 192.1 lb

## 2012-03-26 DIAGNOSIS — F3289 Other specified depressive episodes: Secondary | ICD-10-CM

## 2012-03-26 DIAGNOSIS — F329 Major depressive disorder, single episode, unspecified: Secondary | ICD-10-CM

## 2012-03-26 DIAGNOSIS — D473 Essential (hemorrhagic) thrombocythemia: Secondary | ICD-10-CM

## 2012-03-26 DIAGNOSIS — D539 Nutritional anemia, unspecified: Secondary | ICD-10-CM

## 2012-03-26 DIAGNOSIS — I10 Essential (primary) hypertension: Secondary | ICD-10-CM

## 2012-03-26 LAB — COMPREHENSIVE METABOLIC PANEL (CC13)
ALT: 15 U/L (ref 0–55)
Albumin: 4.1 g/dL (ref 3.5–5.0)
CO2: 27 mEq/L (ref 22–29)
Calcium: 9.7 mg/dL (ref 8.4–10.4)
Chloride: 103 mEq/L (ref 98–107)
Creatinine: 1.3 mg/dL — ABNORMAL HIGH (ref 0.6–1.1)
Potassium: 4.3 mEq/L (ref 3.5–5.1)

## 2012-03-26 LAB — CBC WITH DIFFERENTIAL/PLATELET
Basophils Absolute: 0 10*3/uL (ref 0.0–0.1)
Eosinophils Absolute: 0 10*3/uL (ref 0.0–0.5)
HGB: 11.9 g/dL (ref 11.6–15.9)
LYMPH%: 30.9 % (ref 14.0–49.7)
MCV: 102.8 fL — ABNORMAL HIGH (ref 79.5–101.0)
MONO#: 0.3 10*3/uL (ref 0.1–0.9)
MONO%: 4.7 % (ref 0.0–14.0)
NEUT#: 3.7 10*3/uL (ref 1.5–6.5)
Platelets: 715 10*3/uL — ABNORMAL HIGH (ref 145–400)
RDW: 18.9 % — ABNORMAL HIGH (ref 11.2–14.5)

## 2012-03-26 LAB — LACTATE DEHYDROGENASE (CC13): LDH: 403 U/L — ABNORMAL HIGH (ref 125–220)

## 2012-03-26 NOTE — Progress Notes (Signed)
CC:   [name], M.D.  PROBLEM LIST: 1. Essential thrombocythemia with diagnosis going back at least to     [name].  The patient underwent a bone marrow on 05/16/[name].     JAK2 mutation was not detected.  At the time of diagnosis platelet     count was 1.3 million without obvious explanation.  [name] has     been on hydroxyurea since May [name].  At one time [name] was on     anagrelide 0.5 mg daily.  However, apparently [name] stopped this.     The patient's compliance has been generally poor.  Fortunately [name]     has not suffered any thrombotic complications related to her     diagnosis. 2. Depression, currently under treatment. 3. History of chronic headaches, possibly migraine headaches. 4. Hypertension. 5. Poor compliance.   MEDICATIONS: 1. Xanax 0.5 mg twice daily as needed. 2. Aspirin 81 mg daily. 3. B complex vitamins 1 daily. 4. Wellbutrin 150 mg daily. 5. Celebrex 200 mg daily as needed. 6. Hydroxyurea 16 capsules per week.  Currently [name] is taking 500     mg twice daily on Mondays, Wednesdays, Thursdays, Saturdays and     Sundays.  [name] takes 1500 mg on Tuesdays and Fridays. 7. Ibuprofen 800 mg every 8 hours as needed. 8. Imipramine 50 mg at bedtime. 9. Hyzaar 50/12.5 mg 1 tablet daily. 10.Toprol-XL 25 mg daily. 11.Multivitamins 1 daily. 12.Paxil CR 37.5 mg daily.  HISTORY:  I am seeing [name] today for the 1st time since her last appointment here on 04/28/2011.  [name] was supposed to see [place] in March or Wendy.  [name] was also supposed to be having CBCs carried out every 2 months.  [name] has been having a lot of problems with depression.  Apparently [name] also finds it difficult to get off at work. [name] is employed at the [employer].  [name] tells me that her day off is Wednesday.  [name] is here today with her daughter, [name].  [name] tells me that [name] missed taking her hydroxyurea yesterday.  [name] says that [name] usually takes about 14 capsules per  week rather than the 16 capsules prescribed.  I am not sure her compliance is quite this good. Fortunately [name] has not had any thrombotic or bleeding problems. [name] has not had any need for hospitalizations or surgery.  [name] does suffer from depression and says [name] takes to bed for a couple weeks at a time and is not able to work during that time.  [name] also has headaches. There are no acute problems at this time.  PHYSICAL EXAMINATION:  [name] looks well.  Weight is 192.2 pounds, height 5 feet 10 inches, body surface area 2.07 m2.  Blood pressure 131/90.  Other vital signs are normal.  There is no scleral icterus. Mouth and pharynx are benign.  No peripheral adenopathy palpable.  Heart and lungs are normal.  Breasts are not examined.  Abdomen is benign with no organomegaly or masses palpable.  Abdomen is rather firm. Extremities:  No peripheral edema or clubbing.  No petechiae or purpura. Neurologic exam is normal.  LABORATORY DATA:  Today, white count 5.9, ANC 3.7, hemoglobin 11.9, hematocrit 36.8 and platelets are 715,000.  Platelet count when it was last checked on 09/24/2011 was 653,000, on 07/02/2011 666,000 and on 04/28/2011 the time of her last visit 534,000.  Chemistries today are notable for an LDH of 403 as compared with 343 on 09/24/2011 and 243  on 04/28/2011.  BUN was 13, creatinine 1.3 and glucose 152 today.  Iron saturation on 04/28/2011 was 75.  Pending today are red blood cell, folate and vitamin B12 level.  IMAGING STUDIES: 1. MRI of the brain with and without IV contrast from 09/10/2011     showed no evidence for pituitary microadenoma.  It was noted that     an elevated prolactin level was present. 2. Limited abdominal ultrasound on 04/25/[name] showed no evidence for     splenomegaly.  IMPRESSION AND PLAN:  [name]'s platelet count today is 715,000.  We have had a problem for quite a while with [name]'s lack of compliance. I have been following her since Wendy  [name].  Once again, I stressed to her the importance of trying to control her platelet count and to achieve a platelet count that would be closer to optimal, ideally less than 500,000 but I will settle for 500,000 range.  I encouraged [name] to take her hydroxyurea as prescribed.  I am also giving her a prescription for anagrelide 0.5 mg to take twice daily.  I have given [name] a note stating that [name] had an appointment with me this afternoon.  I have asked [name] to come in on a monthly basis for CBC.  I have asked her to return in 6 months at which time we will check CBC and chemistries.    ______________________________ Samul Dada, M.D. DSM/MEDQ  D:  03/26/2012  T:  03/26/2012  Job:  [id number]

## 2012-03-26 NOTE — Telephone Encounter (Signed)
Gave pt appt calendar for 6 months

## 2012-03-26 NOTE — Progress Notes (Signed)
This office note has been dictated.  #[id number]

## 2012-03-26 NOTE — Telephone Encounter (Signed)
Gave pt appt for 6 months, lab every months, see MD in March in 2014

## 2012-03-30 ENCOUNTER — Other Ambulatory Visit: Payer: Self-pay | Admitting: Medical Oncology

## 2012-03-30 ENCOUNTER — Encounter: Payer: Self-pay | Admitting: Medical Oncology

## 2012-03-30 LAB — FOLATE RBC: RBC Folate: 662 ng/mL (ref >=366)

## 2012-03-30 MED ORDER — ANAGRELIDE HCL 0.5 MG PO CAPS: 0.5000 mg | ORAL_CAPSULE | Freq: Two times a day (BID) | ORAL | Status: DC

## 2012-04-21 ENCOUNTER — Other Ambulatory Visit: Payer: Federal, State, Local not specified - PPO | Admitting: Lab

## 2012-04-26 ENCOUNTER — Telehealth: Payer: Self-pay | Admitting: Oncology

## 2012-04-26 NOTE — Telephone Encounter (Signed)
pt had l/m to r/s missed lab,c/b and left a vm for her   aom

## 2012-05-19 ENCOUNTER — Other Ambulatory Visit (HOSPITAL_BASED_OUTPATIENT_CLINIC_OR_DEPARTMENT_OTHER): Payer: Federal, State, Local not specified - PPO | Admitting: Lab

## 2012-05-19 DIAGNOSIS — D473 Essential (hemorrhagic) thrombocythemia: Secondary | ICD-10-CM

## 2012-05-19 LAB — CBC WITH DIFFERENTIAL/PLATELET
Basophils Absolute: 0 10*3/uL (ref 0.0–0.1)
EOS%: 0.8 % (ref 0.0–7.0)
HCT: 32.6 % — ABNORMAL LOW (ref 34.8–46.6)
HGB: 10.6 g/dL — ABNORMAL LOW (ref 11.6–15.9)
MCH: 33.2 pg (ref 25.1–34.0)
MCV: 102 fL — ABNORMAL HIGH (ref 79.5–101.0)
MONO%: 8.5 % (ref 0.0–14.0)
NEUT%: 44.3 % (ref 38.4–76.8)
Platelets: 650 10*3/uL — ABNORMAL HIGH (ref 145–400)

## 2012-05-20 ENCOUNTER — Encounter: Payer: Self-pay | Admitting: Medical Oncology

## 2012-05-20 ENCOUNTER — Telehealth: Payer: Self-pay | Admitting: Medical Oncology

## 2012-05-20 NOTE — Telephone Encounter (Signed)
This is a correction to prior note. Pt is taking her 16 hydrea a week and her anagrelide 0.5mg  twice a day not once a day. Per Dr. Arline Asp he would like for pt to increase her anagrelide 0.5mg (2) tabs twice a day.  She voiced understanding. I stressed the importance of getting her labs checked as scheduled due to the increase in her medication. I explained that she will run out of her medications sooner due to increase so we will call her in a new prescription.

## 2012-05-20 NOTE — Telephone Encounter (Signed)
I called pt per Dr. Arline Asp to ask if she has been taking her anagrelide 0.5mg  daily and her hydrea 16 capsules per week. She states she is taking the meds as prescribed. I explained that her platelet count is still around 650. I will discuss with Dr. Arline Asp and call her back with further instructions.

## 2012-05-21 ENCOUNTER — Other Ambulatory Visit: Payer: Self-pay | Admitting: Medical Oncology

## 2012-05-21 MED ORDER — ANAGRELIDE HCL 0.5 MG PO CAPS: ORAL_CAPSULE | ORAL | Status: DC

## 2012-06-23 ENCOUNTER — Other Ambulatory Visit: Payer: Federal, State, Local not specified - PPO | Admitting: Lab

## 2012-06-30 ENCOUNTER — Ambulatory Visit
Admission: RE | Admit: 2012-06-30 | Discharge: 2012-06-30 | Disposition: A | Discharge status: Home / Self Care | Payer: Federal, State, Local not specified - PPO | Source: Ambulatory Visit | Attending: Family Medicine | Admitting: Family Medicine

## 2012-06-30 ENCOUNTER — Other Ambulatory Visit: Payer: Self-pay | Admitting: Family Medicine

## 2012-06-30 DIAGNOSIS — R0602 Shortness of breath: Secondary | ICD-10-CM

## 2012-07-20 ENCOUNTER — Other Ambulatory Visit: Payer: Federal, State, Local not specified - PPO | Admitting: Lab

## 2012-07-22 ENCOUNTER — Telehealth: Payer: Self-pay | Admitting: Oncology

## 2012-07-22 NOTE — Telephone Encounter (Signed)
pt called and r/s missed lab...Marland Kitchenpt aware.Marland KitchenMarland KitchenMarland KitchenDone

## 2012-07-27 ENCOUNTER — Other Ambulatory Visit (HOSPITAL_BASED_OUTPATIENT_CLINIC_OR_DEPARTMENT_OTHER): Payer: Federal, State, Local not specified - PPO

## 2012-07-27 DIAGNOSIS — D473 Essential (hemorrhagic) thrombocythemia: Secondary | ICD-10-CM

## 2012-07-27 LAB — CBC WITH DIFFERENTIAL/PLATELET
Basophils Absolute: 0 10*3/uL (ref 0.0–0.1)
EOS%: 1.1 % (ref 0.0–7.0)
LYMPH%: 34.9 % (ref 14.0–49.7)
MCH: 33.7 pg (ref 25.1–34.0)
MCV: 101.5 fL — ABNORMAL HIGH (ref 79.5–101.0)
MONO%: 5.3 % (ref 0.0–14.0)
Platelets: 532 10*3/uL — ABNORMAL HIGH (ref 145–400)
RBC: 3.03 10*6/uL — ABNORMAL LOW (ref 3.70–5.45)
RDW: 19 % — ABNORMAL HIGH (ref 11.2–14.5)

## 2012-08-18 ENCOUNTER — Other Ambulatory Visit: Payer: Federal, State, Local not specified - PPO | Admitting: Lab

## 2012-09-24 ENCOUNTER — Telehealth: Payer: Self-pay | Admitting: Oncology

## 2012-09-24 NOTE — Telephone Encounter (Signed)
S/W pt in re to r/s appt to 03/12 @ 9:45. PER MD Calendar mailed.

## 2012-09-29 ENCOUNTER — Other Ambulatory Visit: Payer: Federal, State, Local not specified - PPO

## 2012-09-29 ENCOUNTER — Ambulatory Visit: Payer: Federal, State, Local not specified - PPO | Admitting: Oncology

## 2012-10-06 ENCOUNTER — Telehealth: Payer: Self-pay | Admitting: Oncology

## 2012-10-06 ENCOUNTER — Ambulatory Visit (HOSPITAL_BASED_OUTPATIENT_CLINIC_OR_DEPARTMENT_OTHER): Payer: Federal, State, Local not specified - PPO | Admitting: Oncology

## 2012-10-06 ENCOUNTER — Encounter: Payer: Self-pay | Admitting: Oncology

## 2012-10-06 ENCOUNTER — Other Ambulatory Visit (HOSPITAL_BASED_OUTPATIENT_CLINIC_OR_DEPARTMENT_OTHER): Payer: Federal, State, Local not specified - PPO | Admitting: Lab

## 2012-10-06 VITALS — BP 120/76 | HR 65 | Temp 97.8°F | Resp 20 | Ht 70.0 in | Wt 189.8 lb

## 2012-10-06 DIAGNOSIS — I10 Essential (primary) hypertension: Secondary | ICD-10-CM

## 2012-10-06 DIAGNOSIS — R51 Headache: Secondary | ICD-10-CM

## 2012-10-06 DIAGNOSIS — D473 Essential (hemorrhagic) thrombocythemia: Secondary | ICD-10-CM

## 2012-10-06 LAB — CBC WITH DIFFERENTIAL/PLATELET
BASO%: 0.8 % (ref 0.0–2.0)
Eosinophils Absolute: 0 10*3/uL (ref 0.0–0.5)
LYMPH%: 43.9 % (ref 14.0–49.7)
MCHC: 33.2 g/dL (ref 31.5–36.0)
MCV: 100.5 fL (ref 79.5–101.0)
MONO%: 7.6 % (ref 0.0–14.0)
NEUT#: 2.6 10*3/uL (ref 1.5–6.5)
RBC: 3.06 10*6/uL — ABNORMAL LOW (ref 3.70–5.45)
RDW: 20.4 % — ABNORMAL HIGH (ref 11.2–14.5)
WBC: 5.5 10*3/uL (ref 3.9–10.3)

## 2012-10-06 LAB — LACTATE DEHYDROGENASE (CC13): LDH: 356 U/L — ABNORMAL HIGH (ref 125–245)

## 2012-10-06 LAB — COMPREHENSIVE METABOLIC PANEL (CC13)
Alkaline Phosphatase: 82 U/L (ref 40–150)
CO2: 26 mEq/L (ref 22–29)
Creatinine: 1.2 mg/dL — ABNORMAL HIGH (ref 0.6–1.1)
Glucose: 104 mg/dl — ABNORMAL HIGH (ref 70–99)
Sodium: 141 mEq/L (ref 136–145)
Total Bilirubin: 0.3 mg/dL (ref 0.20–1.20)

## 2012-10-06 NOTE — Progress Notes (Signed)
This office note has been dictated.  #[id number]

## 2012-10-06 NOTE — Telephone Encounter (Signed)
gv and printed appt schedule for May, July, and sept

## 2012-10-06 NOTE — Progress Notes (Signed)
CC:   Duncan Dull, M.D.  PROBLEM LIST:  1. Essential thrombocythemia with diagnosis going back at least to  April 2006. The patient underwent a bone marrow on 12/10/2004.  JAK2 mutation was not detected. At the time of diagnosis platelet  count was 1.3 million without obvious explanation. [name] has  been on hydroxyurea since May 2006. At one time she was on  anagrelide 0.5 mg daily. However, apparently [name] stopped this.  The patient's compliance has been generally poor. Fortunately she  has not suffered any thrombotic complications related to her  diagnosis. Anagrelide was restarted on 03/26/2012. 2. Depression, currently under treatment.  3. History of chronic headaches, possibly migraine headaches.  4. Hypertension.  5. Poor compliance.   MEDICATIONS:  Reviewed and recorded. Current Outpatient Prescriptions  Medication Sig Dispense Refill  . ALPRAZolam (XANAX) 0.5 MG tablet Take 0.5 mg by mouth 2 (two) times daily as needed.      Marland Kitchen anagrelide (AGRYLIN) 0.5 MG capsule Take 2 capsules (1 mg)  twice a day.  120 capsule  2  . aspirin 81 MG tablet Take 81 mg by mouth daily.      Marland Kitchen buPROPion (WELLBUTRIN SR) 150 MG 12 hr tablet Take 150 mg by mouth daily.      . celecoxib (CELEBREX) 200 MG capsule Take 200 mg by mouth daily as needed.      . hydroxyurea (HYDREA) 500 MG capsule Take 1 capsule (500 mg total) by mouth as directed. May take with food to minimize GI side effects. Take 2  Capsules = 1000 mg po  M, W, F, Sat, Sun  ;    Take  3  Capsules =  1500 mg po  Tues,  Th.  68 capsule  6  . ibuprofen (ADVIL,MOTRIN) 800 MG tablet Take 800 mg by mouth every 8 (eight) hours as needed.      Marland Kitchen imipramine (TOFRANIL) 50 MG tablet Take 50 mg by mouth at bedtime.      Marland Kitchen losartan-hydrochlorothiazide (HYZAAR) 50-12.5 MG per tablet Take 1 tablet by mouth daily.      . metoprolol succinate (TOPROL-XL) 25 MG 24 hr tablet Take 25 mg by mouth daily.      . Multiple Vitamin (MULTIVITAMIN) tablet  Take 1 tablet by mouth daily. [employer] Women's ultra vitamin      . PARoxetine (PAXIL-CR) 25 MG 24 hr tablet Take 37.5 mg by mouth every morning.       No current facility-administered medications for this visit.   TREATMENT PROGRAM:   -Hydroxyurea 1000 mg daily for 5 days and 1500 mg on Tuesdays and Fridays. Hydroxyurea was started in late May 2006.  -Anagrelide 0.5 mg twice daily.  This was restarted on 03/26/2012.    SMOKING HISTORY:  Patient has never smoked cigarettes.    HISTORY:  [name] was seen today for followup of her essential thrombocythemia diagnosed in April 2006.  [name] was last seen by [place] on 03/26/2012.  [name] tells me that she has been taking anagrelide once a day, for reasons that are unclear.  She was supposed to be taking anagrelide 0.5 mg twice a day.  She says she is being compliant with her hydroxyurea.  She was supposed to have come in on a monthly basis for CBCs.  It looks like she has come in about every 2 months.  [name] tells me that there have been some concerns about her blood pressure and renal function.  Dr. Kevan Ny is following that.  Apparently, some adjustments were made in [name]'s blood pressure medicines.  She apparently has a little bit more problems with depression during the winter months.  At the present time she is without complaints.  She had some episodes of chest discomfort and shortness of breath that were apparently attributed to anxiety and possibly panic attacks.  There have been no stroke-like symptoms.  PHYSICAL EXAM:  General:  [name] looks well.  Weight is 189 pounds 12.8 ounces, height 5 feet 10 inches, body surface area 2.06 sq m.  Vital Signs:  Blood pressure 120/76.  Other vital signs are normal.  Weight tends to fluctuate, essentially unchanged.  HEENT:  There is no scleral icterus.  Mouth and pharynx are benign.  No peripheral adenopathy palpable.  Breasts:  Not examined.  [name] was urged to get mammograms on  a regular basis.  Abdomen:  Benign with no organomegaly or masses palpable.  Abdomen is somewhat resistant.  Extremities:  No peripheral edema or clubbing.  No petechiae or purpura.  Neurologic:  Exam is normal.  The patient does not have a Port-A-Cath or central catheter.  LABORATORY DATA:  Today, white count 5.5, ANC 2.6, hemoglobin 10.2, hematocrit 30.8, platelets 616,000.  On 07/27/2012 platelet count was 532,000.  On 05/19/2012 platelet count was 650,000.  As stated, [name] says she has been taking 1 anagrelide 0.5 mg daily instead of the recommended twice a day schedule.  I have urged her to take the anagrelide as recommended.  Chemistries from today notable for a BUN of 16, creatinine 1.2, glucose 104, LDH 356.  Red cell folate on 03/26/2012 was 662 and vitamin B12 level was 385.  IMAGING STUDIES:  1. MRI of the brain with and without IV contrast from 09/10/2011  showed no evidence for pituitary microadenoma. It was noted that  an elevated prolactin level was present.  2. Limited abdominal ultrasound on 11/19/2004 showed no evidence for  splenomegaly. 3. Chest x-ray, 2 view, on 06/30/2012 showed no active disease.   IMPRESSION AND PLAN:  [name]'s condition seems to be stable.  Once again, we have talked about compliance and I have urged her to take the anagrelide 0.5 mg twice daily.  At one time she had been on anagrelide just 0.5 mg daily in addition to the hydroxyurea, but we started her back on the anagrelide on 03/26/2012 because the platelet count came back 715,000.  We will plan to check CBCs every 2 months and plan to see [name] again in 6 months, at which time we will check CBC, chemistries and LDH.    ______________________________ Samul Dada, M.D. DSM/MEDQ  D:  10/06/2012  T:  10/06/2012  Job:  [id number]

## 2012-12-02 ENCOUNTER — Telehealth: Payer: Self-pay | Admitting: Oncology

## 2012-12-02 NOTE — Telephone Encounter (Signed)
Returned pt's call re coming in for lb earlier on 5/12. lmonvm for pt re new time 5/12 @ 9:15am.

## 2012-12-06 ENCOUNTER — Other Ambulatory Visit: Payer: Federal, State, Local not specified - PPO

## 2013-01-05 ENCOUNTER — Ambulatory Visit: Payer: Federal, State, Local not specified - PPO | Admitting: Licensed Clinical Social Worker

## 2013-01-06 ENCOUNTER — Ambulatory Visit: Payer: Federal, State, Local not specified - PPO | Admitting: Licensed Clinical Social Worker

## 2013-01-12 ENCOUNTER — Other Ambulatory Visit: Payer: Federal, State, Local not specified - PPO | Admitting: Lab

## 2013-01-31 ENCOUNTER — Other Ambulatory Visit: Payer: Federal, State, Local not specified - PPO

## 2013-02-02 ENCOUNTER — Other Ambulatory Visit: Payer: Federal, State, Local not specified - PPO | Admitting: Lab

## 2013-02-03 ENCOUNTER — Telehealth: Payer: Self-pay | Admitting: *Deleted

## 2013-02-03 NOTE — Telephone Encounter (Signed)
Returned pt's call re r/s 7/9 appt. Per pt she did not know she had an appt 7/9 or 9/15. appt for was on schedule since 6/9 and was r/s from 7/7 with pt who was given the new d/t for 7/9. Also 04/11/13 appt has been on schedule since March 2014. R/s'd both appt w/pt and gave pt new d/t's for lb 7/16 @ 10am and lb/CP 9/17 @ 1pm. Schedule also mailed.

## 2013-02-09 ENCOUNTER — Other Ambulatory Visit (HOSPITAL_BASED_OUTPATIENT_CLINIC_OR_DEPARTMENT_OTHER): Payer: Federal, State, Local not specified - PPO

## 2013-02-09 DIAGNOSIS — D473 Essential (hemorrhagic) thrombocythemia: Secondary | ICD-10-CM

## 2013-02-09 LAB — CBC WITH DIFFERENTIAL/PLATELET
Basophils Absolute: 0 10*3/uL (ref 0.0–0.1)
Eosinophils Absolute: 0 10*3/uL (ref 0.0–0.5)
HCT: 30.3 % — ABNORMAL LOW (ref 34.8–46.6)
LYMPH%: 45.3 % (ref 14.0–49.7)
MCV: 97.7 fL (ref 79.5–101.0)
MONO#: 0.4 10*3/uL (ref 0.1–0.9)
MONO%: 7.2 % (ref 0.0–14.0)
NEUT#: 2.3 10*3/uL (ref 1.5–6.5)
NEUT%: 45.8 % (ref 38.4–76.8)
Platelets: 577 10*3/uL — ABNORMAL HIGH (ref 145–400)
WBC: 5 10*3/uL (ref 3.9–10.3)

## 2013-02-23 ENCOUNTER — Ambulatory Visit: Payer: Federal, State, Local not specified - PPO | Admitting: Licensed Clinical Social Worker

## 2013-04-11 ENCOUNTER — Ambulatory Visit: Payer: Federal, State, Local not specified - PPO

## 2013-04-11 ENCOUNTER — Other Ambulatory Visit: Payer: Federal, State, Local not specified - PPO | Admitting: Lab

## 2013-04-12 ENCOUNTER — Other Ambulatory Visit: Payer: Self-pay

## 2013-04-12 DIAGNOSIS — D473 Essential (hemorrhagic) thrombocythemia: Secondary | ICD-10-CM

## 2013-04-13 ENCOUNTER — Ambulatory Visit: Payer: Federal, State, Local not specified - PPO

## 2013-04-13 ENCOUNTER — Other Ambulatory Visit: Payer: Self-pay

## 2013-04-13 ENCOUNTER — Other Ambulatory Visit: Payer: Federal, State, Local not specified - PPO | Admitting: Lab

## 2013-04-15 ENCOUNTER — Telehealth: Payer: Self-pay | Admitting: Internal Medicine

## 2013-04-15 NOTE — Telephone Encounter (Signed)
, °

## 2013-04-19 ENCOUNTER — Telehealth: Payer: Self-pay | Admitting: Internal Medicine

## 2013-04-19 NOTE — Telephone Encounter (Signed)
pt called to r/s appt...need wednesday appts

## 2013-04-21 ENCOUNTER — Encounter (HOSPITAL_COMMUNITY): Payer: Self-pay | Admitting: *Deleted

## 2013-04-21 ENCOUNTER — Emergency Department (HOSPITAL_COMMUNITY): Payer: Federal, State, Local not specified - PPO

## 2013-04-21 ENCOUNTER — Encounter (HOSPITAL_COMMUNITY): Payer: Self-pay

## 2013-04-21 ENCOUNTER — Emergency Department (HOSPITAL_COMMUNITY)
Admission: EM | Admit: 2013-04-21 | Discharge: 2013-04-21 | Disposition: A | Discharge status: Other Acute Inpatient Hospital | Payer: Federal, State, Local not specified - PPO | Attending: Emergency Medicine | Admitting: Emergency Medicine

## 2013-04-21 ENCOUNTER — Inpatient Hospital Stay (HOSPITAL_COMMUNITY)
Admission: AD | Admit: 2013-04-21 | Discharge: 2013-04-25 | DRG: 430 | Disposition: A | Discharge status: Home / Self Care | Payer: Federal, State, Local not specified - PPO | Source: Intra-hospital | Attending: Psychiatry | Admitting: Psychiatry

## 2013-04-21 DIAGNOSIS — Z79899 Other long term (current) drug therapy: Secondary | ICD-10-CM

## 2013-04-21 DIAGNOSIS — Z3202 Encounter for pregnancy test, result negative: Secondary | ICD-10-CM | POA: Insufficient documentation

## 2013-04-21 DIAGNOSIS — F322 Major depressive disorder, single episode, severe without psychotic features: Secondary | ICD-10-CM

## 2013-04-21 DIAGNOSIS — D473 Essential (hemorrhagic) thrombocythemia: Secondary | ICD-10-CM

## 2013-04-21 DIAGNOSIS — F411 Generalized anxiety disorder: Secondary | ICD-10-CM | POA: Diagnosis present

## 2013-04-21 DIAGNOSIS — R45851 Suicidal ideations: Secondary | ICD-10-CM

## 2013-04-21 DIAGNOSIS — D649 Anemia, unspecified: Secondary | ICD-10-CM | POA: Insufficient documentation

## 2013-04-21 DIAGNOSIS — F329 Major depressive disorder, single episode, unspecified: Secondary | ICD-10-CM

## 2013-04-21 DIAGNOSIS — F332 Major depressive disorder, recurrent severe without psychotic features: Principal | ICD-10-CM | POA: Diagnosis present

## 2013-04-21 DIAGNOSIS — D539 Nutritional anemia, unspecified: Secondary | ICD-10-CM

## 2013-04-21 HISTORY — DX: Essential (hemorrhagic) thrombocythemia: D47.3

## 2013-04-21 LAB — RAPID URINE DRUG SCREEN, HOSP PERFORMED
Barbiturates: NOT DETECTED
Cocaine: NOT DETECTED
Tetrahydrocannabinol: NOT DETECTED

## 2013-04-21 LAB — URINALYSIS, ROUTINE W REFLEX MICROSCOPIC
Hgb urine dipstick: NEGATIVE
Nitrite: NEGATIVE
Protein, ur: NEGATIVE mg/dL
Urobilinogen, UA: 1 mg/dL (ref 0.0–1.0)

## 2013-04-21 LAB — COMPREHENSIVE METABOLIC PANEL
ALT: 18 U/L (ref 0–35)
AST: 22 U/L (ref 0–37)
Albumin: 4.1 g/dL (ref 3.5–5.2)
Alkaline Phosphatase: 83 U/L (ref 39–117)
CO2: 27 mEq/L (ref 19–32)
Calcium: 9.4 mg/dL (ref 8.4–10.5)
Chloride: 100 mEq/L (ref 96–112)
GFR calc Af Amer: 66 mL/min — ABNORMAL LOW (ref >90)
GFR calc non Af Amer: 57 mL/min — ABNORMAL LOW (ref >90)
Glucose, Bld: 88 mg/dL (ref 70–99)
Potassium: 3.6 mEq/L (ref 3.5–5.1)
Sodium: 139 mEq/L (ref 135–145)
Total Protein: 7.6 g/dL (ref 6.0–8.3)

## 2013-04-21 LAB — CBC
Hemoglobin: 10.8 g/dL — ABNORMAL LOW (ref 12.0–15.0)
MCH: 31.7 pg (ref 26.0–34.0)
RBC: 3.41 MIL/uL — ABNORMAL LOW (ref 3.87–5.11)
RDW: 19.6 % — ABNORMAL HIGH (ref 11.5–15.5)
WBC: 6.7 10*3/uL (ref 4.0–10.5)

## 2013-04-21 LAB — PREGNANCY, URINE: Preg Test, Ur: NEGATIVE

## 2013-04-21 LAB — POCT PREGNANCY, URINE: Preg Test, Ur: NEGATIVE

## 2013-04-21 LAB — ACETAMINOPHEN LEVEL: Acetaminophen (Tylenol), Serum: <15 ug/mL (ref 10–30)

## 2013-04-21 MED ORDER — ASPIRIN EC 81 MG PO TBEC
81.0000 mg | DELAYED_RELEASE_TABLET | Freq: Every day | ORAL | Status: DC
  Administered 2013-04-22 – 2013-04-25 (×4): 81 mg via ORAL
  Filled 2013-04-21 (×8): qty 1

## 2013-04-21 MED ORDER — IOHEXOL 350 MG/ML SOLN
100.0000 mL | Freq: Once | INTRAVENOUS | Status: AC | PRN
  Administered 2013-04-21: 100 mL via INTRAVENOUS

## 2013-04-21 MED ORDER — HYDROCHLOROTHIAZIDE 12.5 MG PO CAPS
12.5000 mg | ORAL_CAPSULE | Freq: Every day | ORAL | Status: DC
  Administered 2013-04-22 – 2013-04-25 (×4): 12.5 mg via ORAL
  Filled 2013-04-21 (×8): qty 1

## 2013-04-21 MED ORDER — LOSARTAN POTASSIUM 50 MG PO TABS: 50.0000 mg | ORAL_TABLET | Freq: Every day | ORAL | Status: DC

## 2013-04-21 MED ORDER — ASPIRIN EC 81 MG PO TBEC: 81.0000 mg | DELAYED_RELEASE_TABLET | Freq: Every day | ORAL | Status: DC

## 2013-04-21 MED ORDER — HYDROXYUREA 500 MG PO CAPS
1500.0000 mg | ORAL_CAPSULE | ORAL | Status: DC
  Filled 2013-04-21: qty 3

## 2013-04-21 MED ORDER — ONDANSETRON HCL 4 MG PO TABS: 4.0000 mg | ORAL_TABLET | Freq: Three times a day (TID) | ORAL | Status: DC | PRN

## 2013-04-21 MED ORDER — HYDROXYUREA 500 MG PO CAPS
1000.0000 mg | ORAL_CAPSULE | ORAL | Status: DC
  Administered 2013-04-22 – 2013-04-25 (×4): 1000 mg via ORAL
  Filled 2013-04-21 (×5): qty 2

## 2013-04-21 MED ORDER — PAROXETINE HCL ER 25 MG PO TB24
25.0000 mg | ORAL_TABLET | Freq: Every day | ORAL | Status: DC
  Filled 2013-04-21: qty 1

## 2013-04-21 MED ORDER — HYDROXYUREA 500 MG PO CAPS: 1000.0000 mg | ORAL_CAPSULE | ORAL | Status: DC

## 2013-04-21 MED ORDER — MAGNESIUM HYDROXIDE 400 MG/5ML PO SUSP: 30.0000 mL | Freq: Every day | ORAL | Status: DC | PRN

## 2013-04-21 MED ORDER — METOPROLOL SUCCINATE ER 25 MG PO TB24
25.0000 mg | ORAL_TABLET | Freq: Every day | ORAL | Status: DC
  Administered 2013-04-22 – 2013-04-25 (×4): 25 mg via ORAL
  Filled 2013-04-21 (×7): qty 1

## 2013-04-21 MED ORDER — ACETAMINOPHEN 325 MG PO TABS: 650.0000 mg | ORAL_TABLET | ORAL | Status: DC | PRN

## 2013-04-21 MED ORDER — ANAGRELIDE HCL 0.5 MG PO CAPS
0.5000 mg | ORAL_CAPSULE | Freq: Two times a day (BID) | ORAL | Status: DC
  Administered 2013-04-23 (×2): 0.5 mg via ORAL
  Filled 2013-04-21 (×11): qty 1

## 2013-04-21 MED ORDER — HYDROCHLOROTHIAZIDE 12.5 MG PO CAPS: 12.5000 mg | ORAL_CAPSULE | Freq: Every day | ORAL | Status: DC

## 2013-04-21 MED ORDER — BUPROPION HCL ER (SR) 150 MG PO TB12: 150.0000 mg | ORAL_TABLET | Freq: Every day | ORAL | Status: DC

## 2013-04-21 MED ORDER — ACETAMINOPHEN 325 MG PO TABS: 650.0000 mg | ORAL_TABLET | Freq: Four times a day (QID) | ORAL | Status: DC | PRN

## 2013-04-21 MED ORDER — LOSARTAN POTASSIUM 50 MG PO TABS
50.0000 mg | ORAL_TABLET | Freq: Every day | ORAL | Status: DC
  Administered 2013-04-22 – 2013-04-25 (×4): 50 mg via ORAL
  Filled 2013-04-21 (×7): qty 1

## 2013-04-21 MED ORDER — ACETAMINOPHEN 325 MG PO TABS
650.0000 mg | ORAL_TABLET | ORAL | Status: DC | PRN
Start: 2013-04-21 — End: 2013-04-21

## 2013-04-21 MED ORDER — ANAGRELIDE HCL 0.5 MG PO CAPS
0.5000 mg | ORAL_CAPSULE | Freq: Two times a day (BID) | ORAL | Status: DC
  Filled 2013-04-21: qty 1

## 2013-04-21 MED ORDER — PAROXETINE HCL ER 25 MG PO TB24
25.0000 mg | ORAL_TABLET | Freq: Every day | ORAL | Status: DC
Start: 2013-04-22 — End: 2013-04-22
  Administered 2013-04-22: 25 mg via ORAL
  Filled 2013-04-21 (×2): qty 1
  Filled 2013-04-21: qty 2

## 2013-04-21 MED ORDER — IMIPRAMINE HCL 50 MG PO TABS
50.0000 mg | ORAL_TABLET | Freq: Every day | ORAL | Status: DC
  Filled 2013-04-21: qty 1

## 2013-04-21 MED ORDER — IMIPRAMINE HCL 50 MG PO TABS
50.0000 mg | ORAL_TABLET | Freq: Every day | ORAL | Status: DC
  Administered 2013-04-21: 50 mg via ORAL
  Filled 2013-04-21: qty 1
  Filled 2013-04-21: qty 2
  Filled 2013-04-21: qty 1

## 2013-04-21 MED ORDER — BUPROPION HCL ER (SR) 150 MG PO TB12
150.0000 mg | ORAL_TABLET | Freq: Every day | ORAL | Status: DC
  Administered 2013-04-22: 150 mg via ORAL
  Filled 2013-04-21 (×3): qty 1

## 2013-04-21 MED ORDER — ADULT MULTIVITAMIN W/MINERALS CH
1.0000 | ORAL_TABLET | Freq: Every day | ORAL | Status: DC
  Filled 2013-04-21: qty 1

## 2013-04-21 MED ORDER — ALUM & MAG HYDROXIDE-SIMETH 200-200-20 MG/5ML PO SUSP: 30.0000 mL | ORAL | Status: DC | PRN

## 2013-04-21 MED ORDER — METOPROLOL SUCCINATE ER 25 MG PO TB24
25.0000 mg | ORAL_TABLET | Freq: Every day | ORAL | Status: DC
  Filled 2013-04-21: qty 1

## 2013-04-21 MED ORDER — ADULT MULTIVITAMIN W/MINERALS CH
1.0000 | ORAL_TABLET | Freq: Every day | ORAL | Status: DC
  Administered 2013-04-22 – 2013-04-25 (×4): 1 via ORAL
  Filled 2013-04-21 (×6): qty 1

## 2013-04-21 NOTE — ED Notes (Addendum)
Pt c/o depression, intermittent HI and SI w/ a plan to "take a bunch of pills."  Sts her best friend/co-worker and another co-worker were killed "a couple months ago" while at work.  Sts she has been talking to a counselor x "a couple times a week" since the incident.  C/o headache x "several weeks."  Pain score 5/10.  A & Ox4.  Sts "I don't want to be here, but my doctor told me to come in yesterday."

## 2013-04-21 NOTE — Consult Note (Signed)
Agree with plan that patient needs inpatient care

## 2013-04-21 NOTE — ED Notes (Signed)
Patient has one bag of belongings in locker 26. 

## 2013-04-21 NOTE — ED Notes (Signed)
Report given to Krista, RN

## 2013-04-21 NOTE — Consult Note (Signed)
  Evaluation for inpatient treatment; Face to face interview and consulted with Dr. Lucianne Muss  Referred by:  EDP   HPI:  Patient present [facility] with complaints of suicidal ideation.  Patient states that she has been treated for depression for years by her primary care physician (PCP) Dr. Graciella Freer.  Patient states that she recently started seeing a therapist 2-3 months ago. It was her therapist and PCP that suggested she be assessed.  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  One of the girls that was killed we were close; in fact she was one of the only people that I was close to and a good friend.  Recently there has been a lot of killings in [place]."  Patient states that she also has a lot of stress on her from one of her daughters but did not elaborate on what the daughter was doing; "my youngest is driving me crazy.  I just can't take her."  Patient states that she has thought about overdosing with medication; "I have enough medicine at home to take."  Patient lives alone and does not have anyone who could stay with her if was to set up for outpatient services and patient is unable to contract for safety.  Patient is tearful throughout interview.     @ Axis I: Major Depression, single episode and Suicidal Ideation Axis II: Deferred Axis III:  Past Medical History  Diagnosis Date  . Essential thrombocythemia    Axis IV: other psychosocial or environmental problems, problems related to social environment and problems with primary support group Axis V: 41-50 serious symptoms  @Psychiatric  Specialty Exam: @PHYSEXAMBYAGE2 @  @ROS @  Blood pressure 121/81, pulse 69, temperature 97.7 F (36.5 C), temperature source Oral, resp. rate 16, SpO2 100.00%.There is no weight on file to calculate BMI.  General Appearance: Casual  Eye Contact::  Minimal  Speech:  Clear and Coherent and Normal Rate   Volume:  Normal  Mood:  Anxious, Depressed and Hopeless  Affect:  Depressed, Flat and Tearful  Thought Process:  Circumstantial and Goal Directed  Orientation:  Full (Time, Place, and Person)  Thought Content:  Rumination  Suicidal Thoughts:  Yes.  with intent/plan  Homicidal Thoughts:  No  Memory:  Immediate;   Good Recent;   Good Remote;   Good  Judgement:  Impaired  Insight:  Fair  Psychomotor Activity:  Normal  Concentration:  Good  Recall:  Good  Akathisia:  No  Handed:  Right  AIMS (if indicated):     Assets:  Communication Skills Desire for Improvement Housing Transportation  Sleep:       Plan: Disposition:  Inpatient treatment; Patient accepted to [facility] pending bed availability; If no bed available seek placement elsewhere.   1. Admit for crisis management and stabilization.  2. Review and initiate  medications pertinent to patient illness and treatment.  3. Medication management to reduce current symptoms to base line and improve the         patient's overall level of functioning.   Start home medications

## 2013-04-21 NOTE — BH Assessment (Signed)
[facility] Assessment Progress Note  Pt accepted to [facility] by Assunta Found, FNP, pending 500 hall bed availability.  Spoke to [place], Charity fundraiser, Adult Social research officer, government.  Pt assigned to Rm 500-2 to the service of Leata Mouse, MD.  At 16:45 I spoke to Phillip Heal, Triage Specialist, and notified her.  Doylene Canning, MA Triage Specialist 04/21/2013 @ 16:45

## 2013-04-21 NOTE — BHH Counselor (Signed)
Support paperwork faxed to [facility].  Writer has notified the nurse.

## 2013-04-21 NOTE — ED Notes (Signed)
Patient transported to CT 

## 2013-04-22 DIAGNOSIS — F332 Major depressive disorder, recurrent severe without psychotic features: Principal | ICD-10-CM

## 2013-04-22 DIAGNOSIS — F411 Generalized anxiety disorder: Secondary | ICD-10-CM

## 2013-04-22 MED ORDER — TRAZODONE HCL 50 MG PO TABS
50.0000 mg | ORAL_TABLET | Freq: Every evening | ORAL | Status: DC | PRN
  Administered 2013-04-23 – 2013-04-24 (×2): 50 mg via ORAL
  Filled 2013-04-22 (×2): qty 1

## 2013-04-22 MED ORDER — IMIPRAMINE HCL 25 MG PO TABS
50.0000 mg | ORAL_TABLET | Freq: Every evening | ORAL | Status: DC | PRN
  Filled 2013-04-22: qty 6
  Filled 2013-04-22: qty 1

## 2013-04-22 MED ORDER — BUPROPION HCL ER (SR) 100 MG PO TB12
200.0000 mg | ORAL_TABLET | Freq: Every day | ORAL | Status: DC
  Administered 2013-04-23 – 2013-04-25 (×3): 200 mg via ORAL
  Filled 2013-04-22: qty 2
  Filled 2013-04-22: qty 12
  Filled 2013-04-22 (×3): qty 2

## 2013-04-22 MED ORDER — PAROXETINE HCL ER 12.5 MG PO TB24
12.5000 mg | ORAL_TABLET | Freq: Every day | ORAL | Status: DC
  Administered 2013-04-23 – 2013-04-24 (×2): 12.5 mg via ORAL
  Filled 2013-04-22 (×5): qty 1

## 2013-04-22 NOTE — Progress Notes (Addendum)
Patient ID: [name], female   DOB: 10/20/1965, 47 y.o.   MRN: [id number] D: Patient in room on approach. Pt mood/affect appeared anxious but appropriate to situation. Pt rated anxiety as 1 on a 0-10 scale. Pt stated excited about possible discharge this weekend. Pt stated she learned to leave her room so she is not thinking about bad thoughts. Pt has been in the dayroom talking and interacting with peers. Pt denies SI/HI/AVH and pain. Pt attended evening wrap up group and Interacted appropriately with peers. Pt denies any needs or concerns.  Cooperative with assessment. No acute distressed noted at this time.   A: Met with pt 1:1.  Writer encouraged pt to discuss feelings. Pt encouraged to come to staff with any question or concerns.     R: Patient remains safe. Continue current POC.

## 2013-04-22 NOTE — Progress Notes (Signed)
Patient ID: Kris Hartmann, female   DOB: [birth year], 47 y.o.   MRN: [id number] Pt. Is a 47 yo female admitted with SI, pt. Friend who was also a Radio broadcast assistant and another coworker were killed on the job by a Investment banker, corporate. Pt. Is employed by the [employer] and says she has found it hard to go to work and hasn't been this past week. Pt. Indicated that she thought about overdosing on medications.  Pt. Also states she is the mother of three daughters and one of the daughters is just hard to get alone with. Pt. Notes she is concerned about her grandchildren. Pt. Also expresses concerns about her health she was seeing a doctor of oncology who retired this year and she has yet to meet with her new oncologist. Per hx pt. Has thrombocytopenia. Pt. Reports problems with her back that has caused problems with her stomach. (pt. Did not elaborate). Pt. Was anxious and cautious about what she said during this admission. Pt. Offered food/drink which she refused notes that sometimes she just doesn't eat, especially with the medications. Writer encouraged pt to shower and relax. Staff will monitor q20min for safety.
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
## 2013-04-22 NOTE — Progress Notes (Signed)
D: Patient appropriate and cooperative with staff and peers. Patient's affect/mood is anxious. She reported on the self inventory sheet that her sleep is fair, appetite and ability to pay attention are both good and energy level is low. Patient rated depression "4" and feelings of hopelessness "1". She's attending groups and compliant with medications.  A: Support and encouragement provided to patient. Scheduled medications administered per MD orders. Maintain Q15 minute checks for safety.  R: Patient receptive. Denies SI/HI. Patient remains safe.

## 2013-04-22 NOTE — Tx Team (Signed)
Initial Interdisciplinary Treatment Plan  PATIENT STRENGTHS: (choose at least two) Average or above average intelligence Capable of independent living Communication skills General fund of knowledge Work skills  PATIENT STRESSORS: Health problems Occupational concerns Traumatic event   PROBLEM LIST: Problem List/Patient Goals Date to be addressed Date deferred Reason deferred Estimated date of resolution  SI 04-21-13     Depression 04-21-13     Thrombocytopenia 04-21-13     Chronic back pain 04-21-13                                    DISCHARGE CRITERIA:  Improved stabilization in mood, thinking, and/or behavior Medical problems require only outpatient monitoring Motivation to continue treatment in a less acute level of care Reduction of life-threatening or endangering symptoms to within safe limits  PRELIMINARY DISCHARGE PLAN: Attend aftercare/continuing care group Attend PHP/IOP Participate in family therapy  PATIENT/FAMIILY INVOLVEMENT: This treatment plan has been presented to and reviewed with the patient, [name], and/or family member.  The patient and family have been given the opportunity to ask questions and make suggestions.  [name] 04/22/2013, 1:01 AM

## 2013-04-22 NOTE — [facility] Group Notes (Signed)
[facility] LCSW Aftercare Discharge Planning Group Note   04/22/2013 8:45 AM  Participation Quality:  Alert and Appropriate   Mood/Affect:  Appropriate, Flat and Depressed  Depression Rating:  1  Anxiety Rating:  1  Thoughts of Suicide:  Pt denies SI/HI  Will you contract for safety?   Yes  Current AVH:  Pt denies  Plan for Discharge/Comments:  Pt attended discharge planning group and actively participated in group.  CSW provided pt with today's workbook.  Pt reports coming to the hospital due to job stress and depression.  Pt states that she lives alone in [place] and has a therapist at [facility] and denies having a psychiatrist.  CSW will secure pt's follow up.  No further needs voiced by pt at this time.    Transportation Means: Pt reports access to transportation - family will pick pt up  Supports: No supports mentioned at this time  Reyes Ivan, LCSWA 04/22/2013 9:55 AM

## 2013-04-22 NOTE — Progress Notes (Signed)
Adult Psychoeducational Group Note  Date:  04/22/2013 Time:  10:00 11:00am Group Topic/Focus:  Relapse Prevention Planning:   The focus of this group is to define relapse and discuss the need for planning to combat relapse.  Participation Level:  Active  Participation Quality:  Appropriate and Attentive  Affect:  Appropriate  Cognitive:  Alert and Appropriate  Insight: Appropriate  Engagement in Group:  Engaged  Modes of Intervention:  Discussion and Education  Additional Comments:  Pt attended and participated in group. When ask what can prevent her from returning pt stated to learn to control her mouth and not engage in aguments. She states she lets things build up and then explodes. She feels she has to have the last word.  Shelly Bombard D 04/22/2013, 11:14 AM

## 2013-04-22 NOTE — [facility] Group Notes (Signed)
[facility] LCSW Group Therapy  04/22/2013 3:23 PM  Type of Therapy:  Group Therapy  Participation Level:  Active  Participation Quality:  Appropriate  Affect:  Irritable  Cognitive:  Alert and Oriented  Insight:  Improving  Engagement in Therapy:  Improving  Modes of Intervention:  Discussion, Education, Exploration, Socialization and Support  Summary of Progress/Problems: Feelings around Relapse. Group members discussed the meaning of relapse and shared personal stories of relapse, how it affected them and others, and how they perceived themselves during this time. Group members were encouraged to identify triggers, warning signs and coping skills used when facing the possibility of relapse. Social supports were discussed and explored in detail. [name] was engaged and attentive throughout today's group. She presented with irritable mood and labile affect. [name] stated that she had never had an issue with her mental health in the past and was not familiar with the day's topic of relapse. "I have always been a strong black woman. I have finally reached by boiling point. To me, a relapse means that I have been through this before or have to think that this will happen to me again." [name] was encouraged to share her personal experience with anger/resentment/negative emotions that led to her hospitalization. [name] talked about her stressful work environment and poor relationship with her boss. She was encouraged by group facilitator to find alternative ways to cope with these stressors and reach out for support from the people in her life that love her and care about her. [name] shows progress in the group setting AEB her ability to share her personal experiences, actively participate in group discussion despite her irritable mood, and respond to encouragement and support from group facilitator and other group members.   Smart, Rodricus Candelaria 04/22/2013, 3:23 PM
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
## 2013-04-22 NOTE — Progress Notes (Signed)
[facility] Group Notes:  (Nursing/MHT/Case Management/Adjunct)  Date:  04/22/2013  Time:  2000  Type of Therapy:  Psychoeducational Skills  Participation Level:  Minimal  Participation Quality:  Attentive  Affect:  Appropriate  Cognitive:  Appropriate  Insight:  Lacking  Engagement in Group:  Lacking  Modes of Intervention:  Education  Summary of Progress/Problems: The patient described her day as having been "pretty good". She offered no additional details about her day. Her goal for tomorrow is to request to be discharged.   Hazle Coca S 04/22/2013, 11:21 PM

## 2013-04-22 NOTE — Tx Team (Signed)
Interdisciplinary Treatment Plan Update (Adult)  Date: 04/22/2013  Time Reviewed:  9:45 AM  Progress in Treatment: Attending groups: Yes Participating in groups:  Yes Taking medication as prescribed:  Yes Tolerating medication:  Yes Family/Significant othe contact made: CSW assessing  Patient understands diagnosis:  Yes Discussing patient identified problems/goals with staff:  Yes Medical problems stabilized or resolved:  Yes Denies suicidal/homicidal ideation: Yes Issues/concerns per patient self-inventory:  Yes Other:  New problem(s) identified: N/A  Discharge Plan or Barriers: CSW assessing for appropriate referrals.  Reason for Continuation of Hospitalization: Anxiety Depression Medication Stabilization  Comments: N/A  Estimated length of stay: 3-5 days  For review of initial/current patient goals, please see plan of care.  Attendees: Patient:     Family:     Physician:  Dr. Johnalagadda 04/22/2013 10:27 AM   Nursing:   Ronecia Byrd, RN 04/22/2013 10:27 AM   Clinical Social Worker:  Melena Hayes Horton, LCSWA 04/22/2013 10:27 AM   Other: Andrea Thorne, RN 04/22/2013 10:27 AM   Other:  Maseta Dorley, MA care coordination 04/22/2013 10:27 AM   Other:  Quylle Hodnett, LCSW 04/22/2013 10:27 AM   Other:     Other:    Other:    Other:    Other:    Other:    Other:     Scribe for Treatment Team:   Horton, Aleece Loyd Nicole, 04/22/2013 10:27 AM   
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
## 2013-04-23 DIAGNOSIS — F411 Generalized anxiety disorder: Secondary | ICD-10-CM

## 2013-04-23 DIAGNOSIS — F332 Major depressive disorder, recurrent severe without psychotic features: Secondary | ICD-10-CM

## 2013-04-23 NOTE — ED Provider Notes (Signed)
CSN: [id number]     Arrival date & time 04/21/13  7829 History   First MD Initiated Contact with Patient 04/21/13 1000     Chief Complaint  Patient presents with  . Medical Clearance  . Suicidal    HPI  [name] is a 46 y.o. female with a PMH of essential thrombocytopenia who presents to the ED for evaluation of suicidal ideation.  History was provided by the patient.  Patient is in the ED for medical clearance for suicidal ideation. She is voluntary.  She states that she is having suicidal thoughts and her plan includes ingesting pills.  She denies doing anything to harm herself prior to arrival. She also has homicidal ideation with a particular person at work.  She states she has a hx of depression.  She denies any drug or daily alcohol use.  She states that earlier this morning she woke up to severe right knee pain.  She states that she developed right sided chest pain at the same time which was described as a pressure sensation.  She states it lasted about 15 minutes and both her knee and chest pain have resolved.  She denies any chest pain currently.  She denies any associated symptoms including SOB, diaphoresis or nausea.  She states she has had similar chest pain in the past.  She states she has a history of a clotting disorder but does not have a hx of PE or DVT.  She complains of a headache for the past several weeks. She otherwise has been well with no fever, chills, change in appetite/activity, rhinorrhea, congestion, cough, abdominal pain, emesis, diarrhea, constipation, weakness, or leg edema.        Past Medical History  Diagnosis Date  . Essential thrombocythemia    History reviewed. No pertinent past surgical history. History reviewed. No pertinent family history. History  Substance Use Topics  . Smoking status: Never Smoker   . Smokeless tobacco: Never Used  . Alcohol Use: No   OB History   Grav Para Term Preterm Abortions TAB SAB Ect Mult Living                  Review of Systems  Constitutional: Negative for fever, chills, diaphoresis, activity change, appetite change and fatigue.  HENT: Negative for nosebleeds, congestion, sore throat, neck pain and neck stiffness.   Eyes: Negative for visual disturbance.  Respiratory: Negative for cough and shortness of breath.   Cardiovascular: Positive for chest pain (resolved). Negative for palpitations and leg swelling.  Gastrointestinal: Negative for nausea, vomiting, abdominal pain, diarrhea and constipation.  Genitourinary: Negative for dysuria.  Musculoskeletal: Positive for arthralgias (resolved). Negative for myalgias, back pain, joint swelling and gait problem.  Skin: Negative for rash and wound.  Neurological: Negative for dizziness, weakness, light-headedness and headaches.  Psychiatric/Behavioral: Negative for confusion.    Allergies  Review of patient's allergies indicates no known allergies.  Home Medications  No current outpatient prescriptions on file. BP 127/88  Pulse 78  Temp(Src) 97.3 F (36.3 C) (Oral)  Resp 15  SpO2 100%  Filed Vitals:   04/21/13 1006 04/21/13 1505 04/21/13 1719  BP: 118/76 121/81 127/88  Pulse: 86 69 78  Temp: 98.6 F (37 C) 97.7 F (36.5 C) 97.3 F (36.3 C)  TempSrc: Oral Oral Oral  Resp: 20 16 15   SpO2: 98% 100% 100%     Physical Exam  Nursing note and vitals reviewed. Constitutional: She is oriented to person, place, and time. She  appears well-developed and well-nourished. No distress.  HENT:  Head: Normocephalic and atraumatic.  Right Ear: External ear normal.  Left Ear: External ear normal.  Nose: Nose normal.  Mouth/Throat: Oropharynx is clear and moist. No oropharyngeal exudate.  Eyes: Conjunctivae and EOM are normal. Pupils are equal, round, and reactive to light. Right eye exhibits no discharge. Left eye exhibits no discharge.  Neck: Normal range of motion. Neck supple.  Cardiovascular: Normal rate, regular rhythm, normal heart sounds  and intact distal pulses.  Exam reveals no gallop and no friction rub.   No murmur heard. Dorsalis pedis pulses bilaterally  Pulmonary/Chest: Effort normal and breath sounds normal. No respiratory distress. She has no wheezes. She has no rales. She exhibits no tenderness.  Abdominal: Soft. Bowel sounds are normal. She exhibits no distension. There is no tenderness.  Musculoskeletal: Normal range of motion. She exhibits no edema and no tenderness.  No knee tenderness or edema bilaterally.  No limitations with ROM of knee flexion and extension.  Patient able to ambulate without difficulty or ataxia. No pedal edema or erythema bilaterally  Neurological: She is alert and oriented to person, place, and time.  GCS 15. No focal neurological deficits. CN 2-12 intact. Sensation intact.    Skin: Skin is warm and dry. She is not diaphoretic.    ED Course  Procedures (including critical care time) Labs Review Labs Reviewed  CBC - Abnormal; Notable for the following:    RBC 3.41 (*)    Hemoglobin 10.8 (*)    HCT 33.7 (*)    RDW 19.6 (*)    Platelets 778 (*)    All other components within normal limits  COMPREHENSIVE METABOLIC PANEL - Abnormal; Notable for the following:    Creatinine, Ser 1.12 (*)    GFR calc non Af Amer 57 (*)    GFR calc Af Amer 66 (*)    All other components within normal limits  SALICYLATE LEVEL - Abnormal; Notable for the following:    Salicylate Lvl <2.0 (*)    All other components within normal limits  D-DIMER, QUANTITATIVE - Abnormal; Notable for the following:    D-Dimer, Quant 1.18 (*)    All other components within normal limits  ACETAMINOPHEN LEVEL  ETHANOL  URINE RAPID DRUG SCREEN (HOSP PERFORMED)  TROPONIN I  URINALYSIS, ROUTINE W REFLEX MICROSCOPIC  PREGNANCY, URINE  POCT PREGNANCY, URINE   Results for orders placed during the hospital encounter of 04/21/13  ACETAMINOPHEN LEVEL      Result Value Range   Acetaminophen (Tylenol), Serum <15.0  10 - 30  ug/mL  CBC      Result Value Range   WBC 6.7  4.0 - 10.5 K/uL   RBC 3.41 (*) 3.87 - 5.11 MIL/uL   Hemoglobin 10.8 (*) 12.0 - 15.0 g/dL   HCT 16.1 (*) 09.6 - 04.5 %   MCV 98.8  78.0 - 100.0 fL   MCH 31.7  26.0 - 34.0 pg   MCHC 32.0  30.0 - 36.0 g/dL   RDW 40.9 (*) 81.1 - 91.4 %   Platelets 778 (*) 150 - 400 K/uL  COMPREHENSIVE METABOLIC PANEL      Result Value Range   Sodium 139  135 - 145 mEq/L   Potassium 3.6  3.5 - 5.1 mEq/L   Chloride 100  96 - 112 mEq/L   CO2 27  19 - 32 mEq/L   Glucose, Bld 88  70 - 99 mg/dL   BUN 14  6 -  23 mg/dL   Creatinine, Ser 1.61 (*) 0.50 - 1.10 mg/dL   Calcium 9.4  8.4 - 09.6 mg/dL   Total Protein 7.6  6.0 - 8.3 g/dL   Albumin 4.1  3.5 - 5.2 g/dL   AST 22  0 - 37 U/L   ALT 18  0 - 35 U/L   Alkaline Phosphatase 83  39 - 117 U/L   Total Bilirubin 0.3  0.3 - 1.2 mg/dL   GFR calc non Af Amer 57 (*) >90 mL/min   GFR calc Af Amer 66 (*) >90 mL/min  ETHANOL      Result Value Range   Alcohol, Ethyl (B) <11  0 - 11 mg/dL  SALICYLATE LEVEL      Result Value Range   Salicylate Lvl <2.0 (*) 2.8 - 20.0 mg/dL  URINE RAPID DRUG SCREEN (HOSP PERFORMED)      Result Value Range   Opiates NONE DETECTED  NONE DETECTED   Cocaine NONE DETECTED  NONE DETECTED   Benzodiazepines NONE DETECTED  NONE DETECTED   Amphetamines NONE DETECTED  NONE DETECTED   Tetrahydrocannabinol NONE DETECTED  NONE DETECTED   Barbiturates NONE DETECTED  NONE DETECTED  TROPONIN I      Result Value Range   Troponin I <0.30  <0.30 ng/mL  URINALYSIS, ROUTINE W REFLEX MICROSCOPIC      Result Value Range   Color, Urine YELLOW  YELLOW   APPearance CLEAR  CLEAR   Specific Gravity, Urine 1.024  1.005 - 1.030   pH 6.0  5.0 - 8.0   Glucose, UA NEGATIVE  NEGATIVE mg/dL   Hgb urine dipstick NEGATIVE  NEGATIVE   Bilirubin Urine NEGATIVE  NEGATIVE   Ketones, ur NEGATIVE  NEGATIVE mg/dL   Protein, ur NEGATIVE  NEGATIVE mg/dL   Urobilinogen, UA 1.0  0.0 - 1.0 mg/dL   Nitrite NEGATIVE   NEGATIVE   Leukocytes, UA NEGATIVE  NEGATIVE  PREGNANCY, URINE      Result Value Range   Preg Test, Ur NEGATIVE  NEGATIVE  D-DIMER, QUANTITATIVE      Result Value Range   D-Dimer, Quant 1.18 (*) 0.00 - 0.48 ug/mL-FEU  POCT PREGNANCY, URINE      Result Value Range   Preg Test, Ur NEGATIVE  NEGATIVE        CT Angio Chest PE W/Cm &/Or Wo Cm (Final result)  Result time: 04/21/13 14:31:22    Final result by Rad Results In Interface (04/21/13 14:31:22)    Narrative:   CLINICAL DATA: Chest pain.  EXAM: CT ANGIOGRAPHY CHEST WITH CONTRAST  TECHNIQUE: Multidetector CT imaging of the chest was performed using the standard protocol during bolus administration of intravenous contrast. Multiplanar CT image reconstructions including MIPs were obtained to evaluate the vascular anatomy.  CONTRAST: OMNIPAQUE IOHEXOL 350 MG/ML SOLN  COMPARISON: Chest radiographs dated 06/30/2012.  FINDINGS: Normally opacified pulmonary arteries with no pulmonary arterial filling defects seen. No lung masses or enlarged lymph nodes. Thoracic spine degenerative changes. Unremarkable upper abdomen.  Review of the MIP images confirms the above findings.  IMPRESSION: No pulmonary emboli or acute abnormality. Edit   Electronically Signed By: Gordan Payment On: 04/21/2013 14:31         Imaging Review No results found.   Date: 04/23/2013  Rate: 81  Rhythm: normal sinus rhythm  QRS Axis: normal  Intervals: normal  ST/T Wave abnormalities: normal  Conduction Disutrbances:none  Narrative Interpretation:   Old EKG Reviewed: none available   MDM  1. Suicidal ideation   2. MDD (major depressive disorder), single episode, severe , no psychosis    [name] is a 48 y.o. female with a PMH of essential thrombocytopenia who presents to the ED for evaluation of suicidal ideation.  Troponin, d-dimer, and EKG ordered to evaluate chest pain.  Labs including CBC, acetaminophen, CMP, ethanol,  salicylate level, UA, urine preg, and urine drug screen ordered.      Patient evaluated in the ED for medical clearance and suicidal ideation.  She will be admitted for further evaluation and management of her depression and suicidal ideation.  She also complained of chest pain earlier in the day.  Her troponin was negative, EKG negative for any acute ischemic changes, and Chest CT negative for PE.  Patient had no complaints of chest pain throughout her ED visit.  She remained in no acute distress.  She was found to have anemia and thrombocythemia, which appears to be her baseline.  Patient in agreement with admission and plan.     Final impressions: 1. Suicidal ideation 2. Depression  3. Chest pain, resolved      Luiz Iron PA-C   This patient was discussed with Dr. Serita Grit, PA-C 04/23/13 [phone]

## 2013-04-23 NOTE — Progress Notes (Deleted)
[facility] MD Progress Note  04/23/2013 1:58 PM [name]  MRN:  [id number] Subjective:  [name] is a 47 y/o woman admitted for depression with suicidal ideation. The patient has been participating in groups. She denies any side effects from her medications.   Diagnosis:   DSM5:  Depressive Disorders:  Major Depressive Disorder - Severe (296.23) AXIS I: Generalized Anxiety Disorder and Major Depression, Recurrent severe  AXIS II: Deferred  AXIS III:  Past Medical History   Diagnosis  Date   .  Essential thrombocythemia     AXIS IV: economic problems, occupational problems, other psychosocial or environmental problems, problems related to social environment and problems with primary support group  AXIS V: 41-50 serious symptoms  ADL's:  Intact  Sleep: Fair  Appetite:  Fair  Suicidal Ideation:  Patient can contract for safety on the unit. Homicidal Ideation:  Patient denies. AEB (as evidenced by):  Psychiatric Specialty Exam: ROS  Blood pressure 108/73, pulse 79, temperature 97.9 F (36.6 C), temperature source Oral, resp. rate 16, height 5\' 9"  (1.753 m), weight 83.008 kg (183 lb).Body mass index is 27.01 kg/(m^2).  General Appearance: Casual  Eye Contact::  Fair  Speech:  Clear and Coherent  Volume:  Normal  Mood:  Anxious  Affect:  Appropriate and Congruent  Thought Process:  Coherent  Orientation:  Full (Time, Place, and Person)  Thought Content:  WDL  Suicidal Thoughts:  No  Homicidal Thoughts:  No  Memory:  Immediate;   Intact Recent;   Intact Remote;   Intact  Judgement:  Fair  Insight:  Present  Psychomotor Activity:  Normal  Concentration:  Good  Recall:  Fair  Akathisia:  No           Current Medications: Current Facility-Administered Medications  Medication Dose Route Frequency Provider Last Rate Last Dose  . acetaminophen (TYLENOL) tablet 650 mg  650 mg Oral Q6H PRN Shuvon Rankin, NP      . alum & mag hydroxide-simeth (MAALOX/MYLANTA)  200-200-20 MG/5ML suspension 30 mL  30 mL Oral Q4H PRN Shuvon Rankin, NP      . anagrelide (AGRYLIN) capsule 0.5 mg  0.5 mg Oral BID Shuvon Rankin, NP   0.5 mg at 04/23/13 0815  . aspirin EC tablet 81 mg  81 mg Oral Daily Shuvon Rankin, NP   81 mg at 04/23/13 0815  . buPROPion [facility]) 12 hr tablet 200 mg  200 mg Oral Daily Nehemiah Settle, MD   200 mg at 04/23/13 0815  . hydrochlorothiazide (MICROZIDE) capsule 12.5 mg  12.5 mg Oral Daily Shuvon Rankin, NP   12.5 mg at 04/23/13 0815  . hydroxyurea (HYDREA) capsule 1,000 mg  1,000 mg Oral Custom Shuvon Rankin, NP   1,000 mg at 04/23/13 0958  . [START ON 04/26/2013] hydroxyurea (HYDREA) capsule 1,500 mg  1,500 mg Oral Custom Nehemiah Settle, MD      . imipramine (TOFRANIL) tablet 50 mg  50 mg Oral QHS PRN Nehemiah Settle, MD      . losartan (COZAAR) tablet 50 mg  50 mg Oral Daily Shuvon Rankin, NP   50 mg at 04/23/13 0815  . magnesium hydroxide (MILK OF MAGNESIA) suspension 30 mL  30 mL Oral Daily PRN Shuvon Rankin, NP      . metoprolol succinate (TOPROL-XL) 24 hr tablet 25 mg  25 mg Oral Daily Shuvon Rankin, NP   25 mg at 04/23/13 0815  . multivitamin with minerals tablet 1 tablet  1 tablet Oral Daily Shuvon Rankin, NP   1 tablet at 04/23/13 0815  . PARoxetine (PAXIL-CR) 24 hr tablet 12.5 mg  12.5 mg Oral Daily Nehemiah Settle, MD   12.5 mg at 04/23/13 0815  . traZODone (DESYREL) tablet 50 mg  50 mg Oral QHS PRN Nehemiah Settle, MD        Lab Results: No results found for this or any previous visit (from the past 48 hour(s)).  Physical Findings: AIMS: Facial and Oral Movements Muscles of Facial Expression: None, normal Lips and Perioral Area: None, normal Jaw: None, normal Tongue: None, normal,Extremity Movements Upper (arms, wrists, hands, fingers): None, normal Lower (legs, knees, ankles, toes): None, normal, Trunk Movements Neck, shoulders, hips: None, normal, Overall  Severity Severity of abnormal movements (highest score from questions above): None, normal Incapacitation due to abnormal movements: None, normal Patient's awareness of abnormal movements (rate only patient's report): No Awareness, Dental Status Current problems with teeth and/or dentures?: No Does patient usually wear dentures?: No  CIWA:    COWS:     Treatment Plan Summary: Daily contact with patient to assess and evaluate symptoms and progress in treatment Medication management  Plan:  Continue current medications. Medical Decision Making Problem Points:  Established problem, stable/improving (1) and Review of last therapy session (1) Data Points:  Decision to obtain old records (1)  I certify that inpatient services furnished can reasonably be expected to improve the patient's condition.   Tor Tsuda 04/23/2013, 1:58 PM

## 2013-04-23 NOTE — [facility] Group Notes (Signed)
[facility] Group Notes:  (Clinical Social Work)  04/23/2013   3:00-4:00PM  Summary of Progress/Problems:   The main focus of today's process group was for the patient to identify ways in which they have sabotaged their own mental health wellness/recovery.  Motivational interviewing was used to explore the reasons they engage in this behavior, and reasons they may have for wanting to change.  The Stages of Change were explained to the group using a handout.  The patient expressed that she self-sabotages with stopping her own medications when she starts to feel better, as well as with isolating herself, staying in bed all day, not eating.  She wants to change in order to get back to a happier life that she knows is possible.  She is especially invested in her grandchildren seeing that it is not "the norm" to stay in bed, but that things can be done that are fun together.  She feels she has a second chance now, because she stayed in bed all the time when her children were little, and now she wants to change that with her grandchildren.  Type of Therapy:  Process Group  Participation Level:  Active  Participation Quality:  Attentive, Sharing and Supportive  Affect:  Defensive, Depressed and Flat  Cognitive:  Appropriate and Oriented  Insight:  Developing/Improving  Engagement in Therapy:  Engaged  Modes of Intervention:  Education, Motivational Interviewing   Ambrose Mantle, LCSW 04/23/2013, 4:22 PM

## 2013-04-23 NOTE — Progress Notes (Signed)
[facility] Group Notes:  (Nursing/MHT/Case Management/Adjunct)  Date:  04/23/2013  Time:  2000  Type of Therapy:  Psychoeducational Skills  Participation Level:  Active  Participation Quality:  Appropriate  Affect:  Appropriate  Cognitive:  Appropriate  Insight:  Improving  Engagement in Group:  Improving  Modes of Intervention:  Education  Summary of Progress/Problems: The patient verbalized that she had a "good day" overall. She attributed her positive day to having a good visit with her grandchildren. Her goal for tomorrow is to have another family visit with her grandchildren.   Hazle Coca S 04/23/2013, 9:06 PM

## 2013-04-23 NOTE — Progress Notes (Signed)
Adult Psychoeducational Group Note  Date:  04/23/2013 Time:  10:14 AM  Group Topic/Focus:  Healthy Coping Skills  Participation Level:  Active  Participation Quality:  Appropriate, Attentive and Sharing  Affect:  Appropriate  Cognitive:  Appropriate  Insight: Improving  Engagement in Group:  Engaged  Modes of Intervention:  Discussion and Education  Additional Comments:  Pt related the coping skills group to personal experience.  Okla Qazi Shari Prows 04/23/2013, 10:14 AM

## 2013-04-23 NOTE — Progress Notes (Addendum)
Patient ID: [name], female   DOB: 25-Jan-1966, 47 y.o.   MRN: [id number] D: Pt is awake and active on the unit this AM. Pt denies SI/HI and A/V hallucinations. Pt rates their depression at 1 and hopelessness at .1 Pt's mood is depressed and her affect is flat. Pt writes that she will "spend time with others" after discharge. Pt is active in the milieu and seems to be vested in tx. Writer encouraged pt to flush toilet twice after use due to the chemo drugs she is presently taken. Pt stated she was grateful and unaware of this precaution.   A: Encouraged pt to discuss feelings with staff and administered medication per MD orders. Writer also encouraged pt to participate in groups.  R: Pt is attending groups and tolerating medications well. Writer will continue to monitor. 15 minute checks are ongoing for safety.

## 2013-04-24 NOTE — Progress Notes (Signed)
Patient ID: [name], female   DOB: May 06, 1966, 47 y.o.   MRN: [id number] D: Pt is awake and active on the unit this AM. Pt denies SI/HI and A/V hallucinations. Pt rates their depression at 1 and hopelessness at 0. Pt's mood is appropriate and her affect is bright. Pt writes that after d/c she will isolate less and try to spend more time outside. Pt is attending groups and participating in the milieu.   A: Encouraged pt to discuss feelings with staff and administered medication per MD orders. Writer also encouraged pt to participate in groups.  R: Pt is attending groups and tolerating medications well. Writer will continue to monitor. 15 minute checks are ongoing for safety.

## 2013-04-24 NOTE — Progress Notes (Signed)
Patient ID: [name], female   DOB: 09/07/1965, 47 y.o.   MRN: [id number]  D: Patient pleasant and cooperative with staff/peers. Pt with bright affect. A: Q 15 minute safety checks, encourage group participation and staff/peer interaction. Administer medications as ordered by MD. R: Patient compliant with medications, attended group session. Pt denies SI at this time; verbally contracts for safety.

## 2013-04-24 NOTE — Progress Notes (Signed)
[facility] MD Progress Note  Late entry note the patient was seen in a timely fashion. [name]  MRN:  [id number] Subjective:  Patient notes that she is "doing good."  She is not isolating herself and is participating in groups. The patient reports she went outside today.  The patient reports she has tolerated the decrease of paxil to 12.5 mg without and withdrawal symptoms or panic. She denies any suicidal and homicidal thoughts.  She reports she was joking about wanting to hurt her supervisor. She reports she has never come close hurting her supervisor.  She reports she is just counting down the years to retirement now.   Diagnosis:   DSM5:  ADL's:  Intact  Sleep: Fair  Appetite:  Fair  Suicidal Ideation:  denies Homicidal Ideation:  denies DSM5:  Schizophrenia Disorders:  Obsessive-Compulsive Disorders:  Trauma-Stressor Disorders: Posttraumatic Stress Disorder (309.81) and Adjustment Disorder with Mixed Anxiety/Depressed Mood (308.03)  Substance/Addictive Disorders:  Depressive Disorders: Major Depressive Disorder - Severe (296.23)  AXIS I: Generalized Anxiety Disorder and Major Depression, Recurrent severe  AXIS II: Deferred  AXIS III:  Past Medical History   Diagnosis  Date   .  Essential thrombocythemia     AXIS IV: economic problems, occupational problems, other psychosocial or environmental problems, problems related to social environment and problems with primary support group  AXIS V: 41-50 serious symptoms   Suicidal ideation denies Homicidal ideation denies AEB (as evidenced by):  Psychiatric Specialty Exam: ROS   Blood pressure 117/68, pulse 76, temperature 98 F (36.7 C), temperature source Oral, resp. rate 18, height 5\' 9"  (1.753 m), weight 83.008 kg (183 lb).Body mass index is 27.01 kg/(m^2).  General Appearance: Fairly Groomed  Patent attorney::  Good  Speech:  Clear and Coherent  Volume:  Normal  Mood:  "good."  Affect:  Congruent  Thought Process:   Circumstantial  Orientation:  Full (Time, Place, and Person)  Thought Content:  WDL  Suicidal Thoughts:  No  Homicidal Thoughts:  No  Memory:  Immediate;   Good Recent;   Good Remote;   Good  Judgement:  Fair  Insight:  Present  Psychomotor Activity:  Normal  Concentration:  Fair  Recall:  Fair  Akathisia:  No  Handed:  Right  AIMS (if indicated):     Assets:  Communication Skills Desire for Improvement Financial Resources/Insurance Housing Physical Health Social Support  Sleep:  Number of Hours: 5.5   Current Medications: Current Facility-Administered Medications  Medication Dose Route Frequency Provider Last Rate Last Dose  . acetaminophen (TYLENOL) tablet 650 mg  650 mg Oral Q6H PRN Shuvon Rankin, NP      . alum & mag hydroxide-simeth (MAALOX/MYLANTA) 200-200-20 MG/5ML suspension 30 mL  30 mL Oral Q4H PRN Shuvon Rankin, NP      . anagrelide (AGRYLIN) capsule 0.5 mg  0.5 mg Oral BID Shuvon Rankin, NP   0.5 mg at 04/23/13 1718  . aspirin EC tablet 81 mg  81 mg Oral Daily Shuvon Rankin, NP   81 mg at 04/24/13 0748  . buPROPion [facility] SR) 12 hr tablet 200 mg  200 mg Oral Daily Nehemiah Settle, MD   200 mg at 04/24/13 0748  . hydrochlorothiazide (MICROZIDE) capsule 12.5 mg  12.5 mg Oral Daily Shuvon Rankin, NP   12.5 mg at 04/24/13 0748  . hydroxyurea (HYDREA) capsule 1,000 mg  1,000 mg Oral Custom Shuvon Rankin, NP   1,000 mg at 04/24/13 1129  . [START ON 04/26/2013] hydroxyurea (HYDREA)  capsule 1,500 mg  1,500 mg Oral Custom Nehemiah Settle, MD      . imipramine (TOFRANIL) tablet 50 mg  50 mg Oral QHS PRN Nehemiah Settle, MD      . losartan (COZAAR) tablet 50 mg  50 mg Oral Daily Shuvon Rankin, NP   50 mg at 04/24/13 0748  . magnesium hydroxide (MILK OF MAGNESIA) suspension 30 mL  30 mL Oral Daily PRN Shuvon Rankin, NP      . metoprolol succinate (TOPROL-XL) 24 hr tablet 25 mg  25 mg Oral Daily Shuvon Rankin, NP   25 mg at 04/24/13 0751  .  multivitamin with minerals tablet 1 tablet  1 tablet Oral Daily Shuvon Rankin, NP   1 tablet at 04/24/13 0748  . PARoxetine (PAXIL-CR) 24 hr tablet 12.5 mg  12.5 mg Oral Daily Nehemiah Settle, MD   12.5 mg at 04/24/13 0747  . traZODone (DESYREL) tablet 50 mg  50 mg Oral QHS PRN Nehemiah Settle, MD   50 mg at 04/23/13 2254    Lab Results: No results found for this or any previous visit (from the past 48 hour(s)).  Physical Findings: AIMS: Facial and Oral Movements Muscles of Facial Expression: None, normal Lips and Perioral Area: None, normal Jaw: None, normal Tongue: None, normal,Extremity Movements Upper (arms, wrists, hands, fingers): None, normal Lower (legs, knees, ankles, toes): None, normal, Trunk Movements Neck, shoulders, hips: None, normal, Overall Severity Severity of abnormal movements (highest score from questions above): None, normal Incapacitation due to abnormal movements: None, normal Patient's awareness of abnormal movements (rate only patient's report): No Awareness, Dental Status Current problems with teeth and/or dentures?: No Does patient usually wear dentures?: No  CIWA:    COWS:     Treatment Plan Summary: Daily contact with patient to assess and evaluate symptoms and progress in treatment  Plan: 1. Continue crisis management and stabilization. 2. Medication management to reduce current symptoms to base line and improve patient's overall level of functioning 3. Treat health problems as indicated.  4. Develop treatment plan to decrease risk of relapse upon discharge and the need for readmission. 5. Psycho-social education regarding relapse prevention and self care. 6. Health care follow up as needed for medical problems. 7. Will have a trial discontinuation of Paxil tomorrow.    Medical Decision Making Problem Points:  Established problem, stable/improving (1) Data Points:  Review or order medicine tests (1)  I certify that inpatient  services furnished can reasonably be expected to improve the patient's condition.   Thong Feeny 04/24/2013, 5:07 PM

## 2013-04-24 NOTE — ED Provider Notes (Signed)
Medical screening examination/treatment/procedure(s) were performed by non-physician practitioner and as supervising physician I was immediately available for consultation/collaboration.   William Brei Pociask, MD 04/24/13 1830 

## 2013-04-24 NOTE — [facility] Group Notes (Signed)
[facility] Group Notes:  (Clinical Social Work)  04/24/2013   3:00-4:00PM  Summary of Progress/Problems:   The main focus of today's process group was to   identify the patient's current support system and decide on other supports that can be put in place.  The picture on workbook was used to discuss why additional supports are needed, and a hand-out was distributed with four definitions/levels of support, then used to talk about how patients have given and received all different kinds of support.  An emphasis was placed on using counselor, doctor, therapy groups, 12-step groups, and problem-specific support groups to expand supports.  The patient identified herself as having no support and she was resistant to the ideas and concepts shared in group until we reached the end of group, at which point she was able to admit that she has in fact shown herself capable of letting people know her to the extent that they could tell something is wrong.  If she can do that here in the hospital, she can expand her supports through intentional activity at home.  Type of Therapy:  Process Group  Participation Level:  Active  Participation Quality:  Attentive, Resistant and Sharing  Affect:  Blunted  Cognitive:  Oriented  Insight:  Improving  Engagement in Therapy:  Developing/Improving  Modes of Intervention:  Education,  Support and Processing  Ambrose Mantle, LCSW 04/24/2013, 4:35 PM

## 2013-04-24 NOTE — Progress Notes (Addendum)
[facility] MD Progress Note  Late entry note the patient was seen in a timely fashion. [name]  MRN:  [id number] Subjective:  Patient notes that she is "doing good today." she knows she is better because she is not isolating in her room, she is up, dressed and out on the hall. She reports she enjoyed seeing her 2month old granddaughter Denny Peon today and that made her 47th birthday more special. Diagnosis:   DSM5:  ADL's:  Intact  Sleep: Fair  Appetite:  Fair  Suicidal Ideation:  denies Homicidal Ideation:  denies DSM5:  Schizophrenia Disorders:  Obsessive-Compulsive Disorders:  Trauma-Stressor Disorders: Posttraumatic Stress Disorder (309.81) and Adjustment Disorder with Mixed Anxiety/Depressed Mood (308.03)  Substance/Addictive Disorders:  Depressive Disorders: Major Depressive Disorder - Severe (296.23)  AXIS I: Generalized Anxiety Disorder and Major Depression, Recurrent severe  AXIS II: Deferred  AXIS III:  Past Medical History   Diagnosis  Date   .  Essential thrombocythemia     AXIS IV: economic problems, occupational problems, other psychosocial or environmental problems, problems related to social environment and problems with primary support group  AXIS V: 41-50 serious symptoms   Suicidal ideation denies Homicidal ideation denies AEB (as evidenced by):  Psychiatric Specialty Exam: ROS  Blood pressure 108/73, pulse 79, temperature 97.9 F (36.6 C), temperature source Oral, resp. rate 16, height 5\' 9"  (1.753 m), weight 83.008 kg (183 lb).Body mass index is 27.01 kg/(m^2).  General Appearance: Fairly Groomed  Patent attorney::  Good  Speech:  Clear and Coherent  Volume:  Normal  Mood:  Anxious and Depressed  Affect:  Congruent  Thought Process:  Circumstantial  Orientation:  Full (Time, Place, and Person)  Thought Content:  WDL  Suicidal Thoughts:  No  Homicidal Thoughts:  No  Memory:  Immediate;   Good Recent;   Good Remote;   Good  Judgement:  Fair   Insight:  Present  Psychomotor Activity:  Normal  Concentration:  Fair  Recall:  Fair  Akathisia:  No  Handed:  Right  AIMS (if indicated):     Assets:  Communication Skills Desire for Improvement Financial Resources/Insurance Housing Physical Health Social Support  Sleep:  Number of Hours: 6.5   Current Medications: Current Facility-Administered Medications  Medication Dose Route Frequency Provider Last Rate Last Dose  . acetaminophen (TYLENOL) tablet 650 mg  650 mg Oral Q6H PRN Shuvon Rankin, NP      . alum & mag hydroxide-simeth (MAALOX/MYLANTA) 200-200-20 MG/5ML suspension 30 mL  30 mL Oral Q4H PRN Shuvon Rankin, NP      . anagrelide (AGRYLIN) capsule 0.5 mg  0.5 mg Oral BID Shuvon Rankin, NP   0.5 mg at 04/23/13 1718  . aspirin EC tablet 81 mg  81 mg Oral Daily Shuvon Rankin, NP   81 mg at 04/23/13 0815  . buPROPion [facility] SR) 12 hr tablet 200 mg  200 mg Oral Daily Nehemiah Settle, MD   200 mg at 04/23/13 0815  . hydrochlorothiazide (MICROZIDE) capsule 12.5 mg  12.5 mg Oral Daily Shuvon Rankin, NP   12.5 mg at 04/23/13 0815  . hydroxyurea (HYDREA) capsule 1,000 mg  1,000 mg Oral Custom Shuvon Rankin, NP   1,000 mg at 04/23/13 0958  . [START ON 04/26/2013] hydroxyurea (HYDREA) capsule 1,500 mg  1,500 mg Oral Custom Nehemiah Settle, MD      . imipramine (TOFRANIL) tablet 50 mg  50 mg Oral QHS PRN Nehemiah Settle, MD      .  losartan (COZAAR) tablet 50 mg  50 mg Oral Daily Shuvon Rankin, NP   50 mg at 04/23/13 0815  . magnesium hydroxide (MILK OF MAGNESIA) suspension 30 mL  30 mL Oral Daily PRN Shuvon Rankin, NP      . metoprolol succinate (TOPROL-XL) 24 hr tablet 25 mg  25 mg Oral Daily Shuvon Rankin, NP   25 mg at 04/23/13 0815  . multivitamin with minerals tablet 1 tablet  1 tablet Oral Daily Shuvon Rankin, NP   1 tablet at 04/23/13 0815  . PARoxetine (PAXIL-CR) 24 hr tablet 12.5 mg  12.5 mg Oral Daily Nehemiah Settle, MD   12.5 mg at  04/23/13 0815  . traZODone (DESYREL) tablet 50 mg  50 mg Oral QHS PRN Nehemiah Settle, MD   50 mg at 04/23/13 2254    Lab Results: No results found for this or any previous visit (from the past 48 hour(s)).  Physical Findings: AIMS: Facial and Oral Movements Muscles of Facial Expression: None, normal Lips and Perioral Area: None, normal Jaw: None, normal Tongue: None, normal,Extremity Movements Upper (arms, wrists, hands, fingers): None, normal Lower (legs, knees, ankles, toes): None, normal, Trunk Movements Neck, shoulders, hips: None, normal, Overall Severity Severity of abnormal movements (highest score from questions above): None, normal Incapacitation due to abnormal movements: None, normal Patient's awareness of abnormal movements (rate only patient's report): No Awareness, Dental Status Current problems with teeth and/or dentures?: No Does patient usually wear dentures?: No  CIWA:    COWS:     Treatment Plan Summary: Daily contact with patient to assess and evaluate symptoms and progress in treatment  Plan: 1. Continue crisis management and stabilization. 2. Medication management to reduce current symptoms to base line and improve patient's overall level of functioning 3. Treat health problems as indicated. 4. Develop treatment plan to decrease risk of relapse upon discharge and the need for     readmission. 5. Psycho-social education regarding relapse prevention and self care. 6. Health care follow up as needed for medical problems. 7. Continue home medications where appropriate.   Medical Decision Making Problem Points:  Established problem, stable/improving (1) Data Points:  Review or order medicine tests (1)  I certify that inpatient services furnished can reasonably be expected to improve the patient's condition.   MASHBURN,NEIL 04/24/2013, 12:30 AM  Discussed with provider. Reviewed note. Agree with above findings, assessment and plan.  Continue  Paroxetine, Bupropion, and trazodone. Patient was seen on 04/23/2013, by Verne Spurr, NP, and late entry note documented.  Jacqulyn Cane, M.D.  04/24/2013 5:43 AM

## 2013-04-24 NOTE — [facility] Group Notes (Signed)
[facility] Group Notes:  (Nursing/MHT/Case Management/Adjunct)  Type of Therapy: Psychoeducational Skills  Participation Level: Active  Participation Quality: Appropriate  Affect: Depressed  Cognitive: Alert and Appropriate  Insight: Appropriate  Engagement in Group: Developing/Improving  Modes of Intervention: Activity, Discussion, Education and Exploration  Summary of Progress/Problems:  Pt attended group programming, discussed healthy support systems - patient identified areas she needs to change, '' need to work on not isolating all the time and going outside even if it's just my front porch. Pt was supportive, and was attentive during group.   Summary of Progress/Problems:  [name] 04/24/2013, 11:00 AM

## 2013-04-24 NOTE — Progress Notes (Signed)
Pt  Appears upbeat today and did go outdoors with the other pts. Pt has been attending groups. She at times refuses some of her meds stating>"I am not taking that it gives me an upset stomach,." Pt does contract for safety and denies any Si or HI ideations.Pt has been in the dayroom most of the shift  And appears in good spirits to day,. She does state her appetite is good .

## 2013-04-24 NOTE — Progress Notes (Signed)
[facility] Group Notes:  (Nursing/MHT/Case Management/Adjunct)  Date:  04/24/2013  Time:  2000  Type of Therapy:  Psychoeducational Skills  Participation Level:  Active  Participation Quality:  Appropriate  Affect:  Appropriate  Cognitive:  Appropriate  Insight:  Improving  Engagement in Group:  Engaged  Modes of Intervention:  Education  Summary of Progress/Problems: The patient shared with the group that she had a good day overall. First of all, the patient stated that she slept really well last night. Finally, she verbalized that she had a good talk with her doctor today. Her goal for tomorrow is to get discharged and to work on finding a support system.   [name] 04/24/2013, 10:24 PM

## 2013-04-25 MED ORDER — ANAGRELIDE HCL 0.5 MG PO CAPS: 0.5000 mg | ORAL_CAPSULE | Freq: Two times a day (BID) | ORAL | Status: DC

## 2013-04-25 MED ORDER — METOPROLOL SUCCINATE ER 25 MG PO TB24: 25.0000 mg | ORAL_TABLET | Freq: Every day | ORAL | Status: DC

## 2013-04-25 MED ORDER — HYDROXYUREA 500 MG PO CAPS: 500.0000 mg | ORAL_CAPSULE | ORAL | Status: DC

## 2013-04-25 MED ORDER — ASPIRIN 81 MG PO TABS: 81.0000 mg | ORAL_TABLET | Freq: Every day | ORAL | Status: AC

## 2013-04-25 MED ORDER — BUPROPION HCL ER (SR) 200 MG PO TB12: 200.0000 mg | ORAL_TABLET | Freq: Every day | ORAL | Status: DC

## 2013-04-25 MED ORDER — LOSARTAN POTASSIUM-HCTZ 50-12.5 MG PO TABS: 1.0000 | ORAL_TABLET | Freq: Every day | ORAL | Status: DC

## 2013-04-25 MED ORDER — IMIPRAMINE HCL 50 MG PO TABS: 50.0000 mg | ORAL_TABLET | Freq: Every day | ORAL | Status: DC

## 2013-04-25 NOTE — Discharge Summary (Signed)
Physician Discharge Summary Note  Patient:  [name] is an 47 y.o., female MRN:  [id number] DOB:  02-29-1966 Patient phone:  [phone] (home)  Patient address:   [address] [address] [place] [address],   Date of Admission:  04/21/2013 Date of Discharge: 04/25/2013   Reason for Admission:  Depression and suicidal ideation   Discharge Diagnoses: Active Problems:   Suicidal ideation   MDD (major depressive disorder), single episode, severe , no psychosis   GAD (generalized anxiety disorder)  ROS  DSM5: Schizophrenia Disorders:  Obsessive-Compulsive Disorders:  Trauma-Stressor Disorders: Posttraumatic Stress Disorder (309.81) and Adjustment Disorder with Mixed Anxiety/Depressed Mood (308.03)  Substance/Addictive Disorders:  Depressive Disorders: Major Depressive Disorder - Severe (296.23)  AXIS I: Generalized Anxiety Disorder and Major Depression, Recurrent severe  AXIS II: Deferred  AXIS III:  Past Medical History   Diagnosis  Date   .  Essential thrombocythemia     AXIS IV: economic problems, occupational problems, other psychosocial or environmental problems, problems related to social environment and problems with primary support group  AXIS V: 41-50 serious symptoms    Level of Care:  OP  Hospital Course:           Patient is a almost 47 years old single female admitted voluntarily, emergently form [facility] for depression and suicidal ideation with plan of overdose on medication.         [name] was oriented to the unit and encouraged to participate in unit programming. Medical problems were identified and treated appropriately. Home medication was restarted as needed. Psychiatric medication management was initiated.           The patient was evaluated each day by a clinical provider to ascertain the patient's response to treatment.  Improvement was noted by the patient's report of decreasing symptoms, improved sleep and appetite, affect, medication tolerance,  behavior, and participation in unit programming.  [name] was asked each day to complete a self inventory noting mood, mental status, pain, new symptoms, anxiety and concerns.         She responded well to medication and being in a therapeutic and supportive environment. Positive and appropriate behavior was noted and the patient was motivated for recovery.  [name] worked closely with the treatment team and case manager to develop a discharge plan with appropriate goals. Coping skills, problem solving as well as relaxation therapies were also part of the unit programming.         By the day of discharge [name] was in much improved condition than upon admission.  Symptoms were reported as significantly decreased or resolved completely.  The patient denied SI/HI and voiced no AVH. She was motivated to continue taking medication with a goal of continued improvement in mental health.         She was pleasant and had a good sense of humor. During her admission she had her 47th birthday. She was happy to see her granddaughter [name] who is 10months old, but clearly her eyes are for her grandson whom she clearly adores. These are her reason for living.        [name] was discharged home with a plan to follow up as noted below. Consults:  None  Significant Diagnostic Studies:  None  Discharge Vitals:   Blood pressure 102/67, pulse 87, temperature 97.9 F (36.6 C), temperature source Oral, resp. rate 18, height 5\' 9"  (1.753 m), weight 83.008 kg (183 lb). Body mass index is 27.01 kg/(m^2). Lab Results:  No results found for this or any previous visit (from the past 72 hour(s)).  Physical Findings: AIMS: Facial and Oral Movements Muscles of Facial Expression: None, normal Lips and Perioral Area: None, normal Jaw: None, normal Tongue: None, normal,Extremity Movements Upper (arms, wrists, hands, fingers): None, normal Lower (legs, knees, ankles, toes): None, normal, Trunk Movements Neck,  shoulders, hips: None, normal, Overall Severity Severity of abnormal movements (highest score from questions above): None, normal Incapacitation due to abnormal movements: None, normal Patient's awareness of abnormal movements (rate only patient's report): No Awareness, Dental Status Current problems with teeth and/or dentures?: No Does patient usually wear dentures?: No  CIWA:    COWS:     Psychiatric Specialty Exam: See Psychiatric Specialty Exam and Suicide Risk Assessment completed by Attending Physician prior to discharge.  Discharge destination:  Home  Is patient on multiple antipsychotic therapies at discharge:  No   Has Patient had three or more failed trials of antipsychotic monotherapy by history:  No  Recommended Plan for Multiple Antipsychotic Therapies: NA  Discharge Orders   Future Appointments Provider Department Dept Phone   05/18/2013 11:00 AM Delcie Roch [facility] [phone]   05/18/2013 11:30 AM [facility] Covering Provider [facility] [phone]   Future Orders Complete By Expires   Diet - low sodium heart healthy  As directed    Discharge instructions  As directed    Comments:     Take all of your medications as directed. Be sure to keep all of your follow up appointments.  If you are unable to keep your follow up appointment, call your Doctor's office to let them know, and reschedule.  Make sure that you have enough medication to last until your appointment. Be sure to get plenty of rest. Going to bed at the same time each night will help. Try to avoid sleeping during the day.  Increase your activity as tolerated. Regular exercise will help you to sleep better and improve your mental health. Eating a heart healthy diet is recommended. Try to avoid salty or fried foods. Be sure to avoid all alcohol and illegal drugs.   Increase activity slowly  As directed        Medication List     STOP taking these medications       ALPRAZolam 0.5 MG tablet  Commonly known as:  XANAX     multivitamin tablet     PARoxetine 25 MG 24 hr tablet  Commonly known as:  PAXIL-CR      TAKE these medications     Indication   anagrelide 0.5 MG capsule  Commonly known as:  AGRYLIN  Take 1 capsule (0.5 mg total) by mouth 2 (two) times daily. For thrombocythemia   Indication:  Abnormal Increase in Number of Blood Platelets     aspirin 81 MG tablet  Take 1 tablet (81 mg total) by mouth daily.   Indication:  Abnormal Increase in Number of Blood Platelets     buPROPion 200 MG 12 hr tablet  Commonly known as:  WELLBUTRIN SR  Take 1 tablet (200 mg total) by mouth daily. For depression.   Indication:  Major Depressive Disorder     hydroxyurea 500 MG capsule  Commonly known as:  HYDREA  - Take 1 capsule (500 mg total) by mouth as directed. May take with food to minimize GI side effects.  - Take 2  Capsules = 1000 mg po  M, W, F,  Sat, Sun  ;    Take  3  Capsules =  1500 mg po  Tues,  Th.  - For thrombocythemia    Thrombocythemia   imipramine 50 MG tablet  Commonly known as:  TOFRANIL  Take 1 tablet (50 mg total) by mouth at bedtime. For insomnia.   Indication:  Trouble Sleeping     losartan-hydrochlorothiazide 50-12.5 MG per tablet  Commonly known as:  HYZAAR  Take 1 tablet by mouth daily. For hypertension.   Indication:  High Blood Pressure     metoprolol succinate 25 MG 24 hr tablet  Commonly known as:  TOPROL-XL  Take 1 tablet (25 mg total) by mouth daily. For hypertension.   Indication:  High Blood Pressure           Follow-up Information   Follow up with [facility] On 05/02/2013. (Appointment scheduled at 4:00 pm with Katlin Hecox for therapy)    Contact information:   [address] [place], [place] [address] Phone: [phone] Fax: [phone]      Follow up with [facility] On 05/24/2013. (Appointment scheduled at 2:45 pm with Dr.  Lolly Mustache for medication management.  Please arrive with paperwork filled out. )    Contact information:   [address] [place], [place] [address] Phone: [phone]      Follow-up recommendations:   Activities: Resume activity as tolerated. Diet: Heart healthy low sodium diet Tests: Follow up testing will be determined by your out patient provider. Comments:    Total Discharge Time:  Greater than 30 minutes.  Signed: Rona Ravens. Mashburn  Patient was evaluated approximately, developed treatment plan, case discussed with the treatment team. Reviewed the information documented and agree with the discharge treatment plan.  Erika Hussar,JANARDHAHA R. 04/25/2013 2:40 PM

## 2013-04-25 NOTE — Tx Team (Signed)
Interdisciplinary Treatment Plan Update (Adult)  Date: 04/25/2013  Time Reviewed:  9:45 AM  Progress in Treatment: Attending groups: Yes Participating in groups:  Yes Taking medication as prescribed:  Yes Tolerating medication:  Yes Family/Significant othe contact made: Yes  Patient understands diagnosis:  Yes Discussing patient identified problems/goals with staff:  Yes Medical problems stabilized or resolved:  Yes Denies suicidal/homicidal ideation: Yes Issues/concerns per patient self-inventory:  Yes Other:  New problem(s) identified: N/A  Discharge Plan or Barriers: Pt will follow up at [facility] for medication management and therapy.    Reason for Continuation of Hospitalization: Stable to d/c  Comments: N/A  Estimated length of stay: D/C today  For review of initial/current patient goals, please see plan of care.  Attendees: Patient:  [name]  04/25/2013 11:38 AM   Family:     Physician:  Dr. Javier Glazier 04/25/2013 11:33 AM   Nursing:   Neill Loft, RN 04/25/2013 11:33 AM   Clinical Social Worker:  Reyes Ivan, LCSWA 04/25/2013 11:33 AM   Other: Verne Spurr, PA 04/25/2013 11:33 AM   Other:  Frankey Shown, MA care coordination 04/25/2013 11:33 AM   Other:  Juline Patch, LCSW 04/25/2013 11:33 AM   Other:  Roswell Miners, RN 04/25/2013 11:34 AM   Other: Quintella Reichert, RN 04/25/2013 11:33 AM   Other: Onnie Boer, RN care manager 04/25/2013 11:33 AM   Other:    Other:    Other:    Other:     Scribe for Treatment Team:   Carmina Miller, 04/25/2013 11:33 AM

## 2013-04-25 NOTE — [facility] Group Notes (Signed)
[facility] LCSW Aftercare Discharge Planning Group Note   04/25/2013 8:45 AM  Participation Quality:  Alert and Appropriate   Mood/Affect:  Appropriate and Bright  Depression Rating:  0  Anxiety Rating:  0  Thoughts of Suicide:  Pt denies SI/HI  Will you contract for safety?   Yes  Current AVH:  Pt denies  Plan for Discharge/Comments:  Pt attended discharge planning group and actively participated in group.  CSW provided pt with today's workbook.  Pt reports feeling well today and ready to d/c.  Pt will return home in [place] and follow up with [facility] for therapy and medication management.  Pt states that she plans to build her support system.  No further needs voiced by pt at this time.    Transportation Means: Pt reports access to transportation - family will pick pt up  Supports: No supports mentioned at this time  [name], LCSWA 04/25/2013 10:22 AM

## 2013-04-25 NOTE — Progress Notes (Signed)
[facility] Adult Case Management Discharge Plan :  Will you be returning to the same living situation after discharge: Yes,  returning home At discharge, do you have transportation home?:Yes,  family will pick pt up Do you have the ability to pay for your medications:Yes,  access to meds  Release of information consent forms completed and in the chart;  Patient's signature needed at discharge.  Patient to Follow up at: Follow-up Information   Follow up with [facility] On 05/02/2013. (Appointment scheduled at 4:00 pm with Katlin Hecox for therapy)    Contact information:   [address] [place], [place] [address] Phone: [phone] Fax: [phone]      Follow up with [facility] On 05/24/2013. (Appointment scheduled at 2:45 pm with Dr. Lolly Mustache for medication management.  Please arrive with paperwork filled out. )    Contact information:   [address] [place], [place] [address] Phone: [phone]      Patient denies SI/HI:   Yes,  denies SI/HI    Safety Planning and Suicide Prevention discussed:  Yes,  discussed with pt and pt's mother.  See suicide prevention education note.   Carmina Miller 04/25/2013, 11:39 AM

## 2013-04-25 NOTE — [facility] Suicide Risk Assessment (Signed)
[facility] INPATIENT:  Family/Significant Other Suicide Prevention Education  Suicide Prevention Education:  Education Completed; [name] - mother [phone]),  (name of family member/significant other) has been identified by the patient as the family member/significant other with whom the patient will be residing, and identified as the person(s) who will aid the patient in the event of a mental health crisis (suicidal ideations/suicide attempt).  With written consent from the patient, the family member/significant other has been provided the following suicide prevention education, prior to the and/or following the discharge of the patient.  The suicide prevention education provided includes the following:  Suicide risk factors  Suicide prevention and interventions  National Suicide Hotline telephone number  [facility] assessment telephone number  [facility] Emergency Assistance 911  [facility] and/or Residential Mobile Crisis Unit telephone number  Request made of family/significant other to:  Remove weapons (e.g., guns, rifles, knives), all items previously/currently identified as safety concern.    Remove drugs/medications (over-the-counter, prescriptions, illicit drugs), all items previously/currently identified as a safety concern.  The family member/significant other verbalizes understanding of the suicide prevention education information provided.  The family member/significant other agrees to remove the items of safety concern listed above.  [name] 04/25/2013, 11:36 AM

## 2013-04-25 NOTE — Progress Notes (Signed)
Adult Psychoeducational Group Note  Date:  04/25/2013 Time:  11:00am Group Topic/Focus:  Self Care:   The focus of this group is to help patients understand the importance of self-care in order to improve or restore emotional, physical, spiritual, interpersonal, and financial health.  Participation Level:  Active  Participation Quality:  Appropriate and Attentive  Affect:  Appropriate  Cognitive:  Alert and Appropriate  Insight: Appropriate  Engagement in Group:  Engaged  Modes of Intervention:  Discussion and Education  Additional Comments:  Pt attended and participated in group. When ask what she could do for her self care after discharge pt stated less isolation learn to trust people and find a support system, see her therapist.   Pryor Curia 04/25/2013, 1:28 PM

## 2013-04-25 NOTE — Progress Notes (Signed)
Patient ID: [name], female   DOB: 1966/07/02, 47 y.o.   MRN: [id number] D: The patient is resting in bed with eyes closed. A: No distress noted.  Safety maintained R. Pt. is safe. Will continue to monitor.

## 2013-04-25 NOTE — [facility] Group Notes (Signed)
[facility] LCSW Group Therapy  04/25/2013  1:15 PM   Type of Therapy:  Group Therapy  Participation Level:  Active  Participation Quality:  Appropriate and Attentive  Affect:  Appropriate and Calm  Cognitive:  Alert and Appropriate  Insight:  Developing/Improving and Engaged  Engagement in Therapy:  Developing/Improving and Engaged  Modes of Intervention:  Clarification, Confrontation, Discussion, Education, Exploration, Limit-setting, Orientation, Problem-solving, Rapport Building, Dance movement psychotherapist, Socialization and Support  Summary of Progress/Problems: Pt identified obstacles faced currently and processed barriers involved in overcoming these obstacles. Pt identified steps necessary for overcoming these obstacles and explored motivation (internal and external) for facing these difficulties head on. Pt further identified one area of concern in their lives and chose a goal to focus on for today.  Pt shared that her biggest obstacle is isolation.  Pt states that she plans to get up and outside of her house more as well as build her support system.  Pt was supportive to peers and actively participated in group discussion.    [name], [place] 04/25/2013 2:39 PM

## 2013-04-25 NOTE — Progress Notes (Signed)
Patient ID: [name], female   DOB: 1966-05-24, 47 y.o.   MRN: [id number] She has been discharged home and was picked up by her mother. She voiced understanding of discharge instruction and of follow up plan. Denies thought of SI and HI. All belongings were taken home with her

## 2013-04-25 NOTE — [facility] Suicide Risk Assessment (Signed)
Suicide Risk Assessment  Discharge Assessment     Demographic Factors:  Adolescent or young adult, Caucasian, Low socioeconomic status, Living alone and Unemployed  Mental Status Per Nursing Assessment::   On Admission:  Suicidal ideation indicated by patient  Current Mental Status by Physician: Patient is calm, quite and cooperative. Her stated mood is good and her her affect is appropriate and bright. Patient has normal rate rhythm and volume of speech and thought process. Patient has denied suicidal/homicidal ideation intentions and plans. Patient has no evidence of psychotic symptoms. Patient has fair insight judgment and impulse control are.  Loss Factors: Financial problems/change in socioeconomic status  Historical Factors: Prior suicide attempts, Family history of mental illness or substance abuse and Impulsivity  Risk Reduction Factors:   Sense of responsibility to family, Religious beliefs about death, Positive social support, Positive therapeutic relationship and Positive coping skills or problem solving skills  Continued Clinical Symptoms:  Depression:   Recent sense of peace/wellbeing Alcohol/Substance Abuse/Dependencies Personality Disorders:   Cluster B Chronic Pain Previous Psychiatric Diagnoses and Treatments Medical Diagnoses and Treatments/Surgeries  Cognitive Features That Contribute To Risk:  Polarized thinking    Suicide Risk:  Minimal: No identifiable suicidal ideation.  Patients presenting with no risk factors but with morbid ruminations; may be classified as minimal risk based on the severity of the depressive symptoms  Discharge Diagnoses:   AXIS I:  Generalized Anxiety Disorder and Major Depression, Recurrent severe AXIS II:  Cluster B Traits AXIS III:   Past Medical History  Diagnosis Date  . Essential thrombocythemia    AXIS IV:  other psychosocial or environmental problems, problems related to social environment and problems with primary  support group AXIS V:  51-60 moderate symptoms  Plan Of Care/Follow-up recommendations:  Activity:  As tolerated Diet:  Regular  Is patient on multiple antipsychotic therapies at discharge:  No   Has Patient had three or more failed trials of antipsychotic monotherapy by history:  No  Recommended Plan for Multiple Antipsychotic Therapies: NA  Viyan Rosamond,JANARDHAHA R. 04/25/2013, 1:19 PM

## 2013-04-28 NOTE — Progress Notes (Addendum)
Patient Discharge Instructions:  After Visit Summary (AVS):   Faxed to:  04/28/13 Discharge Summary Note:   Faxed to:  04/28/13 Psychiatric Admission Assessment Note:   Faxed to:  04/28/13 Suicide Risk Assessment - Discharge Assessment:   Faxed to:  04/28/13 Faxed/Sent to the Next Level Care provider:  04/28/13 Next Level Care Provider Has Access to the EMR, 04/28/13 Faxed to [facility] @ [phone] Records provided to [facility] via [facility]/Epic access.  Jerelene Redden, 04/28/2013, 4:11 PM

## 2013-05-03 ENCOUNTER — Other Ambulatory Visit: Payer: Federal, State, Local not specified - PPO | Admitting: Lab

## 2013-05-03 ENCOUNTER — Ambulatory Visit: Payer: Federal, State, Local not specified - PPO

## 2013-05-18 ENCOUNTER — Other Ambulatory Visit: Payer: Federal, State, Local not specified - PPO | Admitting: Lab

## 2013-05-18 ENCOUNTER — Other Ambulatory Visit: Payer: Self-pay | Admitting: Internal Medicine

## 2013-05-18 ENCOUNTER — Ambulatory Visit: Payer: Federal, State, Local not specified - PPO

## 2013-05-18 DIAGNOSIS — D473 Essential (hemorrhagic) thrombocythemia: Secondary | ICD-10-CM

## 2013-05-24 ENCOUNTER — Other Ambulatory Visit (HOSPITAL_COMMUNITY): Payer: Federal, State, Local not specified - PPO | Attending: Psychiatry | Admitting: Psychiatry

## 2013-05-24 ENCOUNTER — Ambulatory Visit (INDEPENDENT_AMBULATORY_CARE_PROVIDER_SITE_OTHER): Payer: Federal, State, Local not specified - PPO | Admitting: Psychiatry

## 2013-05-24 ENCOUNTER — Encounter (INDEPENDENT_AMBULATORY_CARE_PROVIDER_SITE_OTHER): Payer: Self-pay

## 2013-05-24 ENCOUNTER — Encounter (HOSPITAL_COMMUNITY): Payer: Self-pay | Admitting: Psychiatry

## 2013-05-24 VITALS — BP 130/90 | HR 75 | Wt 184.0 lb

## 2013-05-24 DIAGNOSIS — F339 Major depressive disorder, recurrent, unspecified: Secondary | ICD-10-CM

## 2013-05-24 DIAGNOSIS — F411 Generalized anxiety disorder: Secondary | ICD-10-CM | POA: Insufficient documentation

## 2013-05-24 DIAGNOSIS — F332 Major depressive disorder, recurrent severe without psychotic features: Secondary | ICD-10-CM | POA: Insufficient documentation

## 2013-05-24 DIAGNOSIS — F329 Major depressive disorder, single episode, unspecified: Secondary | ICD-10-CM

## 2013-05-24 DIAGNOSIS — F259 Schizoaffective disorder, unspecified: Secondary | ICD-10-CM | POA: Insufficient documentation

## 2013-05-24 DIAGNOSIS — F4310 Post-traumatic stress disorder, unspecified: Secondary | ICD-10-CM

## 2013-05-24 MED ORDER — PAROXETINE HCL ER 25 MG PO TB24: 25.0000 mg | ORAL_TABLET | Freq: Every day | ORAL | Status: DC

## 2013-05-24 MED ORDER — LAMOTRIGINE 25 MG PO TABS: ORAL_TABLET | ORAL | Status: DC
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
## 2013-05-25 ENCOUNTER — Encounter (HOSPITAL_COMMUNITY): Payer: Federal, State, Local not specified - PPO | Attending: Psychiatry

## 2013-05-25 ENCOUNTER — Encounter (HOSPITAL_COMMUNITY): Payer: Self-pay

## 2013-05-25 DIAGNOSIS — F411 Generalized anxiety disorder: Secondary | ICD-10-CM | POA: Insufficient documentation

## 2013-05-25 DIAGNOSIS — F332 Major depressive disorder, recurrent severe without psychotic features: Secondary | ICD-10-CM | POA: Insufficient documentation

## 2013-05-25 DIAGNOSIS — F4310 Post-traumatic stress disorder, unspecified: Secondary | ICD-10-CM

## 2013-05-25 DIAGNOSIS — F331 Major depressive disorder, recurrent, moderate: Secondary | ICD-10-CM

## 2013-05-25 DIAGNOSIS — F259 Schizoaffective disorder, unspecified: Secondary | ICD-10-CM | POA: Insufficient documentation

## 2013-05-25 MED ORDER — MIRTAZAPINE 15 MG PO TABS: 15.0000 mg | ORAL_TABLET | Freq: Every day | ORAL | Status: DC
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
## 2013-05-26 ENCOUNTER — Encounter (HOSPITAL_COMMUNITY): Payer: Federal, State, Local not specified - PPO | Admitting: Psychiatry

## 2013-05-26 DIAGNOSIS — F322 Major depressive disorder, single episode, severe without psychotic features: Secondary | ICD-10-CM

## 2013-05-26 NOTE — Progress Notes (Signed)
    Daily Group Progress Note  Program: IOP  Group Time: 9:00-10:30 am   Participation Level: Active  Behavioral Response: Appropriate  Type of Therapy:  Process Group  Summary of Progress: Today was Pts first day in the group. She was introduced, attentive and observed the group process.      Group Time: 10:30 am - 12:00 pm   Participation Level:  Active  Behavioral Response: Appropriate  Type of Therapy: Psycho-education Group  Summary of Progress: Pt learned about the skill of mindfulness and how to use it to manage stress.   Carman Ching, LCSW

## 2013-05-26 NOTE — Progress Notes (Signed)
    Daily Group Progress Note  Program: IOP  Group Time: 9:00-10:30 am   Participation Level: Active  Behavioral Response: Appropriate  Type of Therapy:  Process Group  Summary of Progress: Pt was more talkative and engaged and shared about work stress with dealing with the customers and how this can cause anxiety and stress.      Group Time: 10:30 am - 12:00 pm   Participation Level:  Active  Behavioral Response: Appropriate  Type of Therapy: Psycho-education Group  Summary of Progress:  Pt participated in a goodbye ceremony for two members ending the group today and practiced having healthy closure and grieving losses.   Carman Ching, LCSW

## 2013-05-27 ENCOUNTER — Encounter (HOSPITAL_COMMUNITY): Payer: Federal, State, Local not specified - PPO

## 2013-05-30 ENCOUNTER — Encounter (HOSPITAL_COMMUNITY): Payer: Federal, State, Local not specified - PPO | Attending: Psychiatry | Admitting: Psychiatry

## 2013-05-30 DIAGNOSIS — F329 Major depressive disorder, single episode, unspecified: Secondary | ICD-10-CM | POA: Insufficient documentation

## 2013-05-30 DIAGNOSIS — F322 Major depressive disorder, single episode, severe without psychotic features: Secondary | ICD-10-CM

## 2013-05-30 NOTE — Progress Notes (Signed)
    Daily Group Progress Note  Program: IOP  Group Time: 9:00-10:30 am   Participation Level: Active  Behavioral Response: Appropriate  Type of Therapy:  Process Group  Summary of Progress: Pt reports feeling "good" today. She talked about how she has struggled finding the right mental health medications and felt overmedicated recently after leaving the hospital. Pt is still struggling with both personal and work stressors that she is processing in the group.      Group Time: 10:30 am - 12:00 pm   Participation Level:  Active  Behavioral Response: Appropriate  Type of Therapy: Psycho-education Group  Summary of Progress: Pt participated in a group on grief and loss and identified current losses impacting overall wellness and effective grieving strategies.  Carman Ching, LCSW

## 2013-05-31 ENCOUNTER — Telehealth (HOSPITAL_COMMUNITY): Payer: Self-pay | Admitting: Psychiatry

## 2013-05-31 ENCOUNTER — Encounter (HOSPITAL_COMMUNITY): Payer: Federal, State, Local not specified - PPO | Admitting: Psychiatry

## 2013-05-31 ENCOUNTER — Other Ambulatory Visit: Payer: Self-pay | Admitting: Oncology

## 2013-05-31 DIAGNOSIS — F329 Major depressive disorder, single episode, unspecified: Secondary | ICD-10-CM

## 2013-05-31 DIAGNOSIS — F322 Major depressive disorder, single episode, severe without psychotic features: Secondary | ICD-10-CM

## 2013-05-31 MED ORDER — PAROXETINE HCL ER 12.5 MG PO TB24: 25.0000 mg | ORAL_TABLET | Freq: Every day | ORAL | Status: DC

## 2013-05-31 NOTE — Progress Notes (Signed)
Patient ID: [name], female   DOB: 10/28/1965, 47 y.o.   MRN: [id number] Patient reviewed and interviewed today, states that she is doing better as far as setting limits with her doctor, has been dreaming excessively. Appetite is good mood is improving anxiety has been better she is working on grief therapy and enjoys the groups. Discussed increasing Remeron to 15 mg at bedtime, decreasing Paxil ER 12.5 mg daily and discontinue imipramine, continue Lamictal 25 mg every day. Patient denies suicidal or homicidal ideation and has no hallucinations or delusions.

## 2013-06-01 ENCOUNTER — Other Ambulatory Visit: Payer: Self-pay | Admitting: *Deleted

## 2013-06-01 ENCOUNTER — Encounter (HOSPITAL_COMMUNITY): Payer: Federal, State, Local not specified - PPO | Admitting: Psychiatry

## 2013-06-01 ENCOUNTER — Telehealth: Payer: Self-pay | Admitting: *Deleted

## 2013-06-01 ENCOUNTER — Telehealth: Payer: Self-pay

## 2013-06-01 ENCOUNTER — Other Ambulatory Visit: Payer: Self-pay

## 2013-06-01 DIAGNOSIS — D473 Essential (hemorrhagic) thrombocythemia: Secondary | ICD-10-CM

## 2013-06-01 DIAGNOSIS — F322 Major depressive disorder, single episode, severe without psychotic features: Secondary | ICD-10-CM

## 2013-06-01 DIAGNOSIS — D539 Nutritional anemia, unspecified: Secondary | ICD-10-CM

## 2013-06-01 MED ORDER — HYDROXYUREA 500 MG PO CAPS: ORAL_CAPSULE | ORAL | Status: DC

## 2013-06-01 NOTE — Telephone Encounter (Signed)
Left VM for patient to please call office to schedule appointment and be seen by Dr.Chism to continue to safely prescribe her Hydrea medication.

## 2013-06-01 NOTE — Telephone Encounter (Signed)
Called [facility] to refil hydrea when I realized I had printed rather than e-scribed the Rx.

## 2013-06-01 NOTE — Progress Notes (Signed)
    Daily Group Progress Note  Program: IOP  Group Time: 9:00-10:30 am   Participation Level: Active  Behavioral Response: Appropriate  Type of Therapy:  Process Group  Summary of Progress: Pt is talkative and engaged in others discussions, but does not share much about her own personal, current struggles. Pt does express dislike for her job and a tendency to become angered easily. This has been observed in group where Pt can get an attitude with Clinical research associate or Pts if she does not like what they say.      Group Time: 10:30 am - 12:00 pm   Participation Level:  Active  Behavioral Response: Appropriate  Type of Therapy: Psycho-education Group  Summary of Progress: Pt learned about how to use aromatherapy to manage depression, anxiety and improve sleep.  Carman Ching, LCSW

## 2013-06-02 ENCOUNTER — Encounter (HOSPITAL_COMMUNITY): Payer: Federal, State, Local not specified - PPO | Admitting: Psychiatry

## 2013-06-02 ENCOUNTER — Telehealth: Payer: Self-pay | Admitting: Internal Medicine

## 2013-06-02 DIAGNOSIS — F322 Major depressive disorder, single episode, severe without psychotic features: Secondary | ICD-10-CM

## 2013-06-02 NOTE — Telephone Encounter (Signed)
Gave pt appt for lab and Md on december 2014 °

## 2013-06-02 NOTE — Progress Notes (Signed)
    Daily Group Progress Note  Program: IOP  Group Time: 9:00-10:30 am   Participation Level: Active  Behavioral Response: Appropriate  Type of Therapy:  Process Group  Summary of Progress: Pt described the symptoms of depression being experienced and discussed triggers that increase the symptoms.       Group Time: 10:30 am - 12:00 pm   Participation Level:  Active  Behavioral Response: Appropriate  Type of Therapy: Psycho-education Group  Summary of Progress: Pt learned about depression as a clinical medical condition and watched a video about celebrities talking openly about their depression that started a dialogue about stigma associated with depression.  Lanessa Shill E, LCSW 

## 2013-06-03 ENCOUNTER — Encounter (HOSPITAL_COMMUNITY): Payer: Federal, State, Local not specified - PPO | Admitting: Psychiatry

## 2013-06-03 DIAGNOSIS — F322 Major depressive disorder, single episode, severe without psychotic features: Secondary | ICD-10-CM

## 2013-06-03 NOTE — Progress Notes (Signed)
    Daily Group Progress Note  Program: IOP  Group Time: 9:00 am - 12:00 pm   Participation Level: Active  Behavioral Response: Appropriate  Type of Therapy:  Process Group  Summary of Progress: Pt participated in a discussion with a member from [facility] on the importance of support groups to manage mental health symptoms and learned about the different supportive services available to access during and after their group participation.        Jamita Mckelvin E, LCSW 

## 2013-06-03 NOTE — Progress Notes (Signed)
    Daily Group Progress Note  Program: IOP  Group Time: 9:00-10:30 am   Participation Level: Active  Behavioral Response: Appropriate  Type of Therapy:  Process Group  Summary of Progress: Pt reports feeling "good". She talked about conflict with her daughter and how she no longer feels guilty about how she parented her children due to feeling "she did the best she could". Pts main stress comes from her conflicted relationships and she appears to struggle with mood regulation when she gets angry.      Group Time: 10:30 am - 12:00 pm   Participation Level:  Active  Behavioral Response: Appropriate  Type of Therapy: Psycho-education Group  Summary of Progress: Pt said goodbye to two members ending group today and practiced the skill of healthy closure.  Carman Ching, LCSW

## 2013-06-06 ENCOUNTER — Encounter (HOSPITAL_COMMUNITY): Payer: Federal, State, Local not specified - PPO | Admitting: Psychiatry

## 2013-06-06 DIAGNOSIS — F322 Major depressive disorder, single episode, severe without psychotic features: Secondary | ICD-10-CM

## 2013-06-07 ENCOUNTER — Encounter (HOSPITAL_COMMUNITY): Payer: Federal, State, Local not specified - PPO | Admitting: Psychiatry

## 2013-06-07 DIAGNOSIS — F411 Generalized anxiety disorder: Secondary | ICD-10-CM

## 2013-06-07 DIAGNOSIS — F322 Major depressive disorder, single episode, severe without psychotic features: Secondary | ICD-10-CM

## 2013-06-07 NOTE — Progress Notes (Signed)
Patient ID: [name], female   DOB: 1966-05-22, 47 y.o.   MRN: [id number] Patient interviewed today, states he doing better is tolerating her medications well. Sleep and appetite are good. Discussed discontinuing the Paxil and she stated understanding. Patient has not been taking her imipramine and is taking Remeron 15 mg at bedtime which he states is good. She continues to be on the Lamictal denies suicidal or homicidal ideation and has no hallucinations or delusions.

## 2013-06-08 ENCOUNTER — Encounter (HOSPITAL_COMMUNITY): Payer: Federal, State, Local not specified - PPO

## 2013-06-08 NOTE — Progress Notes (Signed)
    Daily Group Progress Note  Program: IOP  Group Time: 9:00-10:30 am   Participation Level: Active  Behavioral Response: Appropriate  Type of Therapy:  Process Group  Summary of Progress: Pt was very talkative and required redirection to allow others time to share. Pt gets very invovled in the problems of others, but requires assistance focusing on her own stressors. Pt is still uncertain about going back to work, but does not talk about it much in th group.      Group Time: 10:30 am - 12:00 pm   Participation Level:  Active  Behavioral Response: Appropriate  Type of Therapy: Psycho-education Group  Summary of Progress: Pt participated in a group with a focus on grief and loss and identified current losses impacting overall wellness and effective grieving strategies.  Carman Ching, LCSW

## 2013-06-09 ENCOUNTER — Encounter (HOSPITAL_COMMUNITY): Payer: Federal, State, Local not specified - PPO | Admitting: Psychiatry

## 2013-06-09 DIAGNOSIS — F322 Major depressive disorder, single episode, severe without psychotic features: Secondary | ICD-10-CM

## 2013-06-09 NOTE — Progress Notes (Signed)
    Daily Group Progress Note  Program: IOP  Group Time: 9:00-10:30 am   Participation Level: Active  Behavioral Response: Agitated  Type of Therapy:  Process Group  Summary of Progress: Pt became defensive and angry when members gave her feedback that she "overtalks" in the group and was "abrasive" the other day in group. Pt did not listen to the feedback and shut down. Pt is struggling with managing her emotions when things don't go her way. She is experiencing this at her job as well.      Group Time: 10:30 am - 12:00 pm   Participation Level:  Active  Behavioral Response: Appropriate  Type of Therapy: Psycho-education Group  Summary of Progress: Pt learned how to reduce anxiety and depression through the use of the heartmath technique and learned how to use it at home for ongoing wellness.   Carman Ching, LCSW

## 2013-06-10 ENCOUNTER — Encounter (HOSPITAL_COMMUNITY): Payer: Federal, State, Local not specified - PPO | Admitting: Psychiatry

## 2013-06-10 DIAGNOSIS — F329 Major depressive disorder, single episode, unspecified: Secondary | ICD-10-CM

## 2013-06-10 DIAGNOSIS — F411 Generalized anxiety disorder: Secondary | ICD-10-CM

## 2013-06-10 DIAGNOSIS — F4310 Post-traumatic stress disorder, unspecified: Secondary | ICD-10-CM

## 2013-06-10 NOTE — Progress Notes (Signed)
Discharge Note  Patient:  [name] is an 47 y.o., female DOB:  06/25/1966  Date of Admission:  05/25/13  Date of Discharge:  06/10/13  Reason for Admission: Depression and anxiety  Hospital Course: Patient started IOP and was started on Remeron this 50 mg at bedtime. Her Paxil was tapered and discontinued. Her Wellbutrin was discontinued and patient stated that she was no longer taking her imipramine. She was continued on her Lamictal 25 mg every day. Patient gradually stabilized was able to set limits with her family and felt good about it. She did well participating in groups both giving and receiving feedback. She was coping well and was tolerating her medications well her mood was good her sleep and appetite were good.  Mental Status at Discharge: Alert, oriented x3, affect was bright mood was euthymic speech was normal with no suicidal or homicidal ideation no hallucinations or delusions were noted. Recent and remote memory was good, judgment and insight were good, concentration and recall are good.  Lab Results: No results found for this or any previous visit (from the past 48 hour(s)).  Current outpatient prescriptions:anagrelide (AGRYLIN) 0.5 MG capsule, Take 1 capsule (0.5 mg total) by mouth 2 (two) times daily. For thrombocythemia, Disp: 60 capsule, Rfl: 0;  aspirin 81 MG tablet, Take 1 tablet (81 mg total) by mouth daily., Disp: 30 tablet, Rfl: 0 hydroxyurea (HYDREA) 500 MG capsule, Take #2 (1000 mg) by mouth daily, except #3 (1500 mg) by mouth on Tues & Thursday. NEED TO MAKE FOLLOW UP APPOINTMENT FOR FUTURE REFILLS, Disp: 68 capsule, Rfl: 0;  lamoTRIgine (LAMICTAL) 25 MG tablet, Take 1 tab daily for 1 week and than 2 tab daily, Disp: 60 tablet, Rfl: 0;  losartan-hydrochlorothiazide (HYZAAR) 50-12.5 MG per tablet, Take 1 tablet by mouth daily. For hypertension., Disp: , Rfl:  metoprolol succinate (TOPROL-XL) 25 MG 24 hr tablet, Take 1 tablet (25 mg total) by mouth daily. For  hypertension., Disp: , Rfl: ;  mirtazapine (REMERON) 15 MG tablet, Take 1 tablet (15 mg total) by mouth at bedtime., Disp: 30 tablet, Rfl: 0  Axis Diagnosis:   Axis I: Anxiety Disorder NOS and Major Depression, Recurrent severe Axis II: Cluster B Traits Axis III:  Past Medical History  Diagnosis Date  . Essential thrombocythemia   . Depression   . Anxiety    Axis IV: educational problems, other psychosocial or environmental problems, problems related to social environment and problems with primary support group Axis V: 61-70 mild symptoms   Level of Care:  OP  Discharge destination:  Home  Is patient on multiple antipsychotic therapies at discharge:  No    Has Patient had three or more failed trials of antipsychotic monotherapy by history:  No  Patient phone:  [phone] (home)  Patient address:   [address] [address] [place] [address],   Follow-up recommendations:  Activity:  Asked tolerated Diet:  Regular Other:  Followup with Dr.Arfeen for medications and Kaitlin Hecock for therapy.  Comments:    The patient received suicide prevention pamphlet:  Yes   Margit Banda 06/10/2013, 5:06 PM
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
## 2013-06-10 NOTE — Patient Instructions (Signed)
Patient completed MH-IOP today.  Will follow up with Dr. Lolly Mustache on 06-13-13 @ 9a.m.  Patient will call [facility], LPC for a follow up appointment.  Encouraged support groups.

## 2013-06-11 ENCOUNTER — Encounter (HOSPITAL_COMMUNITY): Payer: Self-pay | Admitting: Psychiatry

## 2013-06-11 NOTE — Progress Notes (Signed)
    Daily Group Progress Note  Program: IOP  Group Time: 9-10:30 am  Participation Level: Active  Behavioral Response: Appropriate and Sharing  Type of Therapy:  Process Group  Summary of Progress: The patient checked-in with a sideways thumb. She admitted she was going to have withdrawal from this group since she is leaving today. The patient explained to the new group member that she was resistant at first about sharing her story, but she was quickly empowered to open up by her fellow group members. She described recent conversations with her toxic family members and how she has begun to set boundaries and stick with them. The group applauded this news as she described her intention to make herself happy and focus on her needs instead of taking care of everyone else. She responded well to this session of processing.   Group Time: 10:45-12pm  Participation Level:  Active  Behavioral Response: Sharing  Type of Therapy: Psycho-education Group  Summary of Progress: The patient drew her wheel of life and almost every part of it reflected her dissatisfaction with her life circumstances. She admitted, though, she had now been given the tools - through this program - to address this lack of happiness and fulfillment and she felt certain things would begin to get better. The patient was graduating today and the 'ceremony' was held with each member sharing something about her. The patient was emotional and expressed her gratitude towards her fellow group members and wished them well. She is departing this program with newfound confidence and understanding about how to live her life and stop enabling others. She expressed her wish that the newest group members would take this gift of group and help themselves.   Carman Ching, LCSW

## 2013-06-11 NOTE — Progress Notes (Signed)
    Daily Group Progress Note  Program: IOP  Group Time: 9-10:30 am  Participation Level: Active  Behavioral Response: Appropriate and Sharing  Type of Therapy:  Process Group  Summary of Progress: The patient reported she was a sideways thumb at check-in. She expressed frustration and anger over the fact that she always takes care of everyone else, but no one every really took care of her. The patient pointed out how her mother was not to be found when she got pregnant at 4. She described some of the things her 3 adult daughters do and expect of her. I pointed out she has enabled them for so long that they are used to it. She agreed that she has allowed this to happen over the years. The patient pointed out she is beginning to apply the new boundaries she has learned in this program and pulling from the strength and support she has also received from her fellow group members. She received good feedback and displayed a good understanding of how she will begin implementing those new boundaries and refusal skills she has learned here.   Group Time: 10:45-12 pm  Participation Level:  Active  Behavioral Response: Appropriate and Sharing  Type of Therapy: Psycho-education Group  Summary of Progress: The patient completed her handout on the Serenity Prayer. The patient seemed to recognize the benefits of this exercise and pointed out she cannot change: the weather, how other people drive, how her adult children feel and act, or others perceptions. The patient stated: I can change my reactions to my boss, my appearance, and how clean my house is. The patient made some excellent comments and responded well to this intervention.   Carman Ching, LCSW

## 2013-06-13 ENCOUNTER — Encounter (HOSPITAL_COMMUNITY): Payer: Self-pay | Admitting: Psychiatry

## 2013-06-13 ENCOUNTER — Ambulatory Visit (INDEPENDENT_AMBULATORY_CARE_PROVIDER_SITE_OTHER): Payer: Federal, State, Local not specified - PPO | Admitting: Psychiatry

## 2013-06-13 ENCOUNTER — Encounter (HOSPITAL_COMMUNITY): Payer: Federal, State, Local not specified - PPO

## 2013-06-13 VITALS — BP 147/98 | HR 93 | Ht 70.0 in | Wt 187.0 lb

## 2013-06-13 DIAGNOSIS — F339 Major depressive disorder, recurrent, unspecified: Secondary | ICD-10-CM

## 2013-06-13 DIAGNOSIS — F329 Major depressive disorder, single episode, unspecified: Secondary | ICD-10-CM

## 2013-06-13 DIAGNOSIS — F4310 Post-traumatic stress disorder, unspecified: Secondary | ICD-10-CM

## 2013-06-13 MED ORDER — LAMOTRIGINE 100 MG PO TABS: ORAL_TABLET | ORAL | Status: DC

## 2013-06-13 MED ORDER — MIRTAZAPINE 15 MG PO TABS: 15.0000 mg | ORAL_TABLET | Freq: Every day | ORAL | Status: DC
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
## 2013-06-14 ENCOUNTER — Encounter (HOSPITAL_COMMUNITY): Payer: Federal, State, Local not specified - PPO

## 2013-06-15 ENCOUNTER — Encounter (HOSPITAL_COMMUNITY): Payer: Federal, State, Local not specified - PPO

## 2013-06-16 ENCOUNTER — Other Ambulatory Visit (HOSPITAL_COMMUNITY): Payer: Self-pay | Admitting: Psychiatry

## 2013-06-16 ENCOUNTER — Encounter (HOSPITAL_COMMUNITY): Payer: Federal, State, Local not specified - PPO

## 2013-06-17 ENCOUNTER — Encounter (HOSPITAL_COMMUNITY): Payer: Federal, State, Local not specified - PPO

## 2013-06-18 ENCOUNTER — Other Ambulatory Visit (HOSPITAL_COMMUNITY): Payer: Self-pay | Admitting: Psychiatry

## 2013-06-20 ENCOUNTER — Encounter (HOSPITAL_COMMUNITY): Payer: Federal, State, Local not specified - PPO

## 2013-06-21 ENCOUNTER — Encounter (HOSPITAL_COMMUNITY): Payer: Federal, State, Local not specified - PPO

## 2013-06-22 ENCOUNTER — Encounter (HOSPITAL_COMMUNITY): Payer: Federal, State, Local not specified - PPO

## 2013-06-24 ENCOUNTER — Encounter (HOSPITAL_COMMUNITY): Payer: Federal, State, Local not specified - PPO

## 2013-06-24 ENCOUNTER — Other Ambulatory Visit (HOSPITAL_COMMUNITY): Payer: Self-pay | Admitting: Psychiatry

## 2013-06-27 ENCOUNTER — Encounter (HOSPITAL_COMMUNITY): Payer: Federal, State, Local not specified - PPO

## 2013-06-28 ENCOUNTER — Encounter (HOSPITAL_COMMUNITY): Payer: Federal, State, Local not specified - PPO

## 2013-06-29 ENCOUNTER — Ambulatory Visit (INDEPENDENT_AMBULATORY_CARE_PROVIDER_SITE_OTHER): Payer: Federal, State, Local not specified - PPO | Admitting: Psychiatry

## 2013-06-29 DIAGNOSIS — F322 Major depressive disorder, single episode, severe without psychotic features: Secondary | ICD-10-CM

## 2013-06-29 DIAGNOSIS — F329 Major depressive disorder, single episode, unspecified: Secondary | ICD-10-CM

## 2013-06-29 DIAGNOSIS — F3289 Other specified depressive episodes: Secondary | ICD-10-CM
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
## 2013-07-06 ENCOUNTER — Telehealth: Payer: Self-pay | Admitting: Internal Medicine

## 2013-07-06 ENCOUNTER — Ambulatory Visit (HOSPITAL_BASED_OUTPATIENT_CLINIC_OR_DEPARTMENT_OTHER): Payer: Federal, State, Local not specified - PPO | Admitting: Internal Medicine

## 2013-07-06 ENCOUNTER — Other Ambulatory Visit (HOSPITAL_BASED_OUTPATIENT_CLINIC_OR_DEPARTMENT_OTHER): Payer: Federal, State, Local not specified - PPO

## 2013-07-06 VITALS — BP 118/78 | HR 61 | Temp 96.9°F | Resp 19 | Ht 70.0 in | Wt 193.0 lb

## 2013-07-06 DIAGNOSIS — D473 Essential (hemorrhagic) thrombocythemia: Secondary | ICD-10-CM

## 2013-07-06 DIAGNOSIS — D6481 Anemia due to antineoplastic chemotherapy: Secondary | ICD-10-CM

## 2013-07-06 DIAGNOSIS — F329 Major depressive disorder, single episode, unspecified: Secondary | ICD-10-CM

## 2013-07-06 DIAGNOSIS — F3289 Other specified depressive episodes: Secondary | ICD-10-CM

## 2013-07-06 DIAGNOSIS — D539 Nutritional anemia, unspecified: Secondary | ICD-10-CM

## 2013-07-06 DIAGNOSIS — F411 Generalized anxiety disorder: Secondary | ICD-10-CM

## 2013-07-06 LAB — CBC WITH DIFFERENTIAL/PLATELET
Basophils Absolute: 0.1 10*3/uL (ref 0.0–0.1)
EOS%: 1.3 % (ref 0.0–7.0)
Eosinophils Absolute: 0.1 10*3/uL (ref 0.0–0.5)
HGB: 10.3 g/dL — ABNORMAL LOW (ref 11.6–15.9)
MCH: 34 pg (ref 25.1–34.0)
MCHC: 32.9 g/dL (ref 31.5–36.0)
NEUT#: 3.9 10*3/uL (ref 1.5–6.5)
NEUT%: 56.4 % (ref 38.4–76.8)
RBC: 3.04 10*6/uL — ABNORMAL LOW (ref 3.70–5.45)
RDW: 21.5 % — ABNORMAL HIGH (ref 11.2–14.5)
WBC: 6.9 10*3/uL (ref 3.9–10.3)
lymph#: 2.3 10*3/uL (ref 0.9–3.3)

## 2013-07-06 LAB — COMPREHENSIVE METABOLIC PANEL (CC13)
AST: 22 U/L (ref 5–34)
Albumin: 3.9 g/dL (ref 3.5–5.0)
Anion Gap: 12 mEq/L — ABNORMAL HIGH (ref 3–11)
BUN: 12.4 mg/dL (ref 7.0–26.0)
Calcium: 9.4 mg/dL (ref 8.4–10.4)
Chloride: 108 mEq/L (ref 98–109)
Glucose: 134 mg/dl (ref 70–140)
Potassium: 3.6 mEq/L (ref 3.5–5.1)
Sodium: 144 mEq/L (ref 136–145)
Total Protein: 7.3 g/dL (ref 6.4–8.3)

## 2013-07-06 LAB — LACTATE DEHYDROGENASE (CC13): LDH: 397 U/L — ABNORMAL HIGH (ref 125–245)

## 2013-07-06 NOTE — Telephone Encounter (Signed)
gv pt appt schedule for february thru june 2015.

## 2013-07-06 NOTE — Progress Notes (Signed)
[facility] OFFICE PROGRESS NOTE  Hollice Espy, MD [address] [place] [address]  DIAGNOSIS: Essential thrombocythemia - Plan: CBC with Differential, Comprehensive metabolic panel (Cmet) - [facility], CBC with Differential in 2 months, CBC with Differential  Antineoplastic chemotherapy induced anemia(285.3) - Plan: CBC with Differential, Comprehensive metabolic panel (Cmet) - [facility], CBC with Differential in 2 months, CBC with Differential  Unspecified deficiency anemia  Chief Complaint  Patient presents with  . Essential thrombocythemia   CURRENT THERAPY: Hydroxyurea 1000 mg daily for 5 days and 1500 mg on Tuesdays and Fridays. Hydroxyurea was started in late May 2006. Anagrelide 0.5 mg twice daily (since taking this in over one year due to concerns about abdominal discomfort).   INTERVAL HISTORY: [name] 47 y.o. female with a history of poor compliance and essential thrombocythemia is here for follow-up.  She was last seen by Dr. Arline Asp on 10/06/2012. She reports that she has been compliant with her hydroxyurea but denies taking the anagrelide due to abdominal discomfort.  She has not been able to return for follow up labs on a monthly basis.   Her last labs were drawn in September revealing a plt count of 779.   She reports compliance to her blood pressure medication.   She is also being followed for anxiety/depression by her psychiatrist Dr. Lucianne Muss and counselors Manson Passey and Kizzie Bane.  At the present time she is without complaints. She had some episodes of chest discomfort and shortness of breath that were apparently attributed to anxiety and possibly panic attacks. There have been no stroke-like symptoms.  She has not had a recent mammogram and reports that she will have one early next year.   She denies prior abnormalities in her past mammograms.   She is no longer menstruating.     MEDICAL HISTORY: Past Medical History  Diagnosis Date  .  Essential thrombocythemia   . Depression   . Anxiety     INTERIM HISTORY: has Essential thrombocythemia; Unspecified deficiency anemia; Suicidal ideation; MDD (major depressive disorder), single episode, severe , no psychosis; and GAD (generalized anxiety disorder) on her problem list.    ALLERGIES:  has No Known Allergies.  MEDICATIONS: has a current medication list which includes the following prescription(s): aspirin, hydroxyurea, lamotrigine, losartan-hydrochlorothiazide, metoprolol succinate, mirtazapine, multivitamin, and anagrelide.  SURGICAL HISTORY: No past surgical history on file.  PROBLEM LIST:  1. Essential thrombocythemia with diagnosis going back at least to April 2006. The patient underwent a bone marrow on 12/10/2004. JAK2 mutation was not detected. At the time of diagnosis platelet count was 1.3 million without obvious explanation. [name] has been on hydroxyurea since May 2006. At one time she was on anagrelide 0.5 mg daily. However, apparently [name] stopped this. The patient's compliance has been generally poor. Fortunately she has not suffered any thrombotic complications related to her diagnosis. Anagrelide was restarted on 03/26/2012.  2. Depression, currently under treatment.  3. History of chronic headaches, possibly migraine headaches.  4. Hypertension.  5. Poor compliance.  REVIEW OF SYSTEMS:   Constitutional: Denies fevers, chills or abnormal weight loss Eyes: Denies blurriness of vision Ears, nose, mouth, throat, and face: Denies mucositis or sore throat Respiratory: Denies cough, dyspnea or wheezes Cardiovascular: Denies palpitation, chest discomfort or lower extremity swelling Gastrointestinal:  Denies nausea, heartburn or change in bowel habits Skin: Denies abnormal skin rashes Lymphatics: Denies new lymphadenopathy or easy bruising Neurological:Denies numbness, tingling or new weaknesses Behavioral/Psych: Mood is stable, no new changes  All other  systems were reviewed with the patient and are negative.  PHYSICAL EXAMINATION: ECOG PERFORMANCE STATUS: 0 - Asymptomatic  Blood pressure 118/78, pulse 61, temperature 96.9 F (36.1 C), temperature source Oral, resp. rate 19, height 5\' 10"  (1.778 m), weight 193 lb (87.544 kg).  GENERAL:alert, no distress and comfortable, well developed and well nourished SKIN: skin color, texture, turgor are normal, no rashes or significant lesions EYES: normal, Conjunctiva are pink and non-injected, sclera clear OROPHARYNX:no exudate, no erythema and lips, buccal mucosa, and tongue normal  NECK: supple, thyroid normal size, non-tender, without nodularity LYMPH:  no palpable lymphadenopathy in the cervical, axillary or supraclavicular LUNGS: clear to auscultation and percussion with normal breathing effort HEART: regular rate & rhythm and no murmurs and no lower extremity edema ABDOMEN:abdomen soft, non-tender and normal bowel sounds Musculoskeletal:no cyanosis of digits and no clubbing  NEURO: alert & oriented x 3 with fluent speech, no focal motor/sensory deficits  LABORATORY DATA: Results for orders placed in visit on 07/06/13 (from the past 48 hour(s))  CBC WITH DIFFERENTIAL     Status: Abnormal   Collection Time    07/06/13  9:09 AM      Result Value Range   WBC 6.9  3.9 - 10.3 10e3/uL   NEUT# 3.9  1.5 - 6.5 10e3/uL   HGB 10.3 (*) 11.6 - 15.9 g/dL   HCT 40.9 (*) 81.1 - 91.4 %   Platelets 554 (*) 145 - 400 10e3/uL   MCV 103.3 (*) 79.5 - 101.0 fL   MCH 34.0  25.1 - 34.0 pg   MCHC 32.9  31.5 - 36.0 g/dL   RBC 7.82 (*) 9.56 - 2.13 10e6/uL   RDW 21.5 (*) 11.2 - 14.5 %   lymph# 2.3  0.9 - 3.3 10e3/uL   MONO# 0.5  0.1 - 0.9 10e3/uL   Eosinophils Absolute 0.1  0.0 - 0.5 10e3/uL   Basophils Absolute 0.1  0.0 - 0.1 10e3/uL   NEUT% 56.4  38.4 - 76.8 %   LYMPH% 33.3  14.0 - 49.7 %   MONO% 7.9  0.0 - 14.0 %   EOS% 1.3  0.0 - 7.0 %   BASO% 1.1  0.0 - 2.0 %  LACTATE DEHYDROGENASE (CC13)     Status:  Abnormal   Collection Time    07/06/13  9:09 AM      Result Value Range   LDH 397 (*) 125 - 245 U/L  COMPREHENSIVE METABOLIC PANEL (CC13)     Status: Abnormal   Collection Time    07/06/13  9:09 AM      Result Value Range   Sodium 144  136 - 145 mEq/L   Potassium 3.6  3.5 - 5.1 mEq/L   Chloride 108  98 - 109 mEq/L   CO2 24  22 - 29 mEq/L   Glucose 134  70 - 140 mg/dl   BUN 08.6  7.0 - 57.8 mg/dL   Creatinine 1.1  0.6 - 1.1 mg/dL   Total Bilirubin 4.69  0.20 - 1.20 mg/dL   Alkaline Phosphatase 82  40 - 150 U/L   AST 22  5 - 34 U/L   ALT 23  0 - 55 U/L   Total Protein 7.3  6.4 - 8.3 g/dL   Albumin 3.9  3.5 - 5.0 g/dL   Calcium 9.4  8.4 - 62.9 mg/dL   Anion Gap 12 (*) 3 - 11 mEq/L    Labs:  Lab Results  Component Value Date  WBC 6.9 07/06/2013   HGB 10.3* 07/06/2013   HCT 31.4* 07/06/2013   MCV 103.3* 07/06/2013   PLT 554* 07/06/2013   NEUTROABS 3.9 07/06/2013      Chemistry      Component Value Date/Time   NA 144 07/06/2013 0909   NA 139 04/21/2013 1027   K 3.6 07/06/2013 0909   K 3.6 04/21/2013 1027   CL 100 04/21/2013 1027   CL 106 10/06/2012 1002   CO2 24 07/06/2013 0909   CO2 27 04/21/2013 1027   BUN 12.4 07/06/2013 0909   BUN 14 04/21/2013 1027   CREATININE 1.1 07/06/2013 0909   CREATININE 1.12* 04/21/2013 1027      Component Value Date/Time   CALCIUM 9.4 07/06/2013 0909   CALCIUM 9.4 04/21/2013 1027   ALKPHOS 82 07/06/2013 0909   ALKPHOS 83 04/21/2013 1027   AST 22 07/06/2013 0909   AST 22 04/21/2013 1027   ALT 23 07/06/2013 0909   ALT 18 04/21/2013 1027   BILITOT 0.30 07/06/2013 0909   BILITOT 0.3 04/21/2013 1027     Basic Metabolic Panel:  Recent Labs Lab 07/06/13 0909  NA 144  K 3.6  CO2 24  GLUCOSE 134  BUN 12.4  CREATININE 1.1  CALCIUM 9.4   GFR Estimated Creatinine Clearance: 74.3 ml/min (by C-G formula based on Cr of 1.1). Liver Function Tests:  Recent Labs Lab 07/06/13 0909  AST 22  ALT 23  ALKPHOS 82  BILITOT 0.30  PROT 7.3   ALBUMIN 3.9   CBC:  Recent Labs Lab 07/06/13 0909  WBC 6.9  NEUTROABS 3.9  HGB 10.3*  HCT 31.4*  MCV 103.3*  PLT 554*   RADIOGRAPHIC STUDIES: 1. MRI of the brain with and without IV contrast from 09/10/2011 showed no evidence for pituitary microadenoma. It was noted that an elevated prolactin level was present.  2. Limited abdominal ultrasound on 11/19/2004 showed no evidence for splenomegaly.  3. Chest x-ray, 2 view, on 06/30/2012 showed no active disease.  ASSESSMENT: [name] 47 y.o. female with a history of Essential thrombocythemia - Plan: CBC with Differential, Comprehensive metabolic panel (Cmet) - [facility], CBC with Differential in 2 months, CBC with Differential  Antineoplastic chemotherapy induced anemia(285.3) - Plan: CBC with Differential, Comprehensive metabolic panel (Cmet) - [facility], CBC with Differential in 2 months, CBC with Differential  Unspecified deficiency anemia   PLAN:  1. Essential Thrombocythemia. --[name]'s condition remains stable.  We discussed extensively the importance of compliance.   Her plts are 554 today.  She was started back on anagrelide in 03/26/2012 secondary to platelet count of 715,000.  She will follow up with repeat labs every other month.  She states that she will try her best to make these appointments as her follow-up will allow for further titration in her medication.   She was provided handouts on hydrea and anagrelide.     2. Anemia secondary chemotherapy. --She is asymptomatic.    3. Depression/Anxiety. --She is being followed closely with psychiatry.    4. Follow-up.  --We will plan to check CBCs every 2 months and plan to see her again in 6 months, at which time we will check CBC, chemistries and LDH.  All questions were answered. The patient knows to call the clinic with any problems, questions or concerns. We can certainly see the patient much sooner if necessary.  I spent 15 minutes counseling the patient face to  face. The total time spent in the appointment was 25 minutes.  Margaretann Abate, MD 07/06/2013 10:48 AM

## 2013-07-06 NOTE — Patient Instructions (Signed)
Anemia, Nonspecific Anemia is a condition in which the concentration of red blood cells or hemoglobin in the blood is below normal. Hemoglobin is a substance in red blood cells that carries oxygen to the tissues of the body. Anemia results in not enough oxygen reaching these tissues.  CAUSES  Common causes of anemia include:   Excessive bleeding. Bleeding may be internal or external. This includes excessive bleeding from periods (in women) or from the intestine.   Poor nutrition.   Chronic kidney, thyroid, and liver disease.  Bone marrow disorders that decrease red blood cell production.  Cancer and treatments for cancer.  HIV, AIDS, and their treatments.  Spleen problems that increase red blood cell destruction.  Blood disorders.  Excess destruction of red blood cells due to infection, medicines, and autoimmune disorders. SIGNS AND SYMPTOMS   Minor weakness.   Dizziness.   Headache.  Palpitations.   Shortness of breath, especially with exercise.   Paleness.  Cold sensitivity.  Indigestion.  Nausea.  Difficulty sleeping.  Difficulty concentrating. Symptoms may occur suddenly or they may develop slowly.  DIAGNOSIS  Additional blood tests are often needed. These help your health care provider determine the best treatment. Your health care provider will check your stool for blood and look for other causes of blood loss.  TREATMENT  Treatment varies depending on the cause of the anemia. Treatment can include:   Supplements of iron, vitamin B12, or folic acid.   Hormone medicines.   A blood transfusion. This may be needed if blood loss is severe.   Hospitalization. This may be needed if there is significant continual blood loss.   Dietary changes.  Spleen removal. HOME CARE INSTRUCTIONS Keep all follow-up appointments. It often takes many weeks to correct anemia, and having your health care provider check on your condition and your response to  treatment is very important. SEEK IMMEDIATE MEDICAL CARE IF:   You develop extreme weakness, shortness of breath, or chest pain.   You become dizzy or have trouble concentrating.  You develop heavy vaginal bleeding.   You develop a rash.   You have bloody or black, tarry stools.   You faint.   You vomit up blood.   You vomit repeatedly.   You have abdominal pain.  You have a fever or persistent symptoms for more than 2 3 days.   You have a fever and your symptoms suddenly get worse.   You are dehydrated.  MAKE SURE YOU:  Understand these instructions.  Will watch your condition.  Will get help right away if you are not doing well or get worse. Document Released: 08/21/2004 Document Revised: 03/16/2013 Document Reviewed: 01/07/2013 [facility] Patient Information 2014 [place], [place]. Anagrelide capsules What is this medicine? ANAGRELIDE (an AG re lide) is used to lower platelet counts. This helps to prevent blood clots from forming. It also reduces the risk of problems like intestinal bleeding, stroke, and heart attack caused by having too many platelets. This medicine may be used for other purposes; ask your health care provider or pharmacist if you have questions. COMMON BRAND NAME(S): Agrylin What should I tell my health care provider before I take this medicine? They need to know if you have any of these conditions: -heart disease -kidney disease -liver disease -an unusual or allergic reaction to anagrelide, other medicines, foods, dyes, or preservatives -pregnant or trying to get pregnant -breast-feeding How should I use this medicine? Take this medicine by mouth with a glass of water. Follow the directions  on the prescription label. Take your medicine at regular intervals. Do not take your medicine more often than directed. Do not stop taking except on the advice of your doctor or health care professional. Talk to your pediatrician regarding the use of  this medicine in children. While this medicine may be prescribed for children as young as 46 years of age for selected conditions, precautions do apply. Overdosage: If you think you have taken too much of this medicine contact a poison control center or emergency room at once. NOTE: This medicine is only for you. Do not share this medicine with others. What if I miss a dose? If you miss a dose, take it as soon as you can. If it is almost time for your next dose, take only that dose. Do not take double or extra doses. What may interact with this medicine? Do not take this medicine with any of the following medications: -cisapride -dofetilide -dronedarone -pimozide -thioridazine -ziprasidone  This medicine may also interact with the following medications: -aspirin and aspirin-like drugs -cilostazol -fluvoxamine -medicines called inotropes like milrinone, enoximone, amrinone, and olprinone -medicines that treat or prevent blood clots like warfarin -sucralfate -theophylline This list may not describe all possible interactions. Give your health care provider a list of all the medicines, herbs, non-prescription drugs, or dietary supplements you use. Also tell them if you smoke, drink alcohol, or use illegal drugs. Some items may interact with your medicine. What should I watch for while using this medicine? Visit your doctor or health care professional for regular checks on your progress. This medicine can make you more sensitive to the sun. Keep out of the sun. If you cannot avoid being in the sun, wear protective clothing and use sunscreen. Do not use sun lamps or tanning beds/booths. What side effects may I notice from receiving this medicine? Side effects that you should report to your doctor or health care professional as soon as possible: -allergic reactions like skin rash, itching or hives, swelling of the face, lips, or tongue -dizziness -fast or irregular heartbeat -feeling faint or  lightheaded, falls -seizures -signs and symptoms of bleeding such as bloody or black, tarry stools; red or dark-brown urine; spitting up blood or brown material that looks like coffee grounds; red spots on the skin; unusual bruising or bleeding from the eye, gums, or nose -signs and symptoms of a blood clot such as breathing problems; changes in vision; chest pain; severe, sudden headache; pain, swelling, warmth in the leg; trouble speaking; sudden numbness or weakness of the face, arm, or leg -trouble passing urine or change in the amount of urine -unusually weak or tired Side effects that usually do not require medical attention (report to your doctor or health care professional if they continue or are bothersome): -abdominal pain -back pain -diarrhea -gas -headache -loss of appetite -nausea, vomiting This list may not describe all possible side effects. Call your doctor for medical advice about side effects. You may report side effects to FDA at 1-800-FDA-1088. Where should I keep my medicine? Keep out of the reach of children. Store at room temperature between 15 and 30 degrees C (59 and 86 degrees F). Protect from light. Throw away any unused medicine after the expiration date. NOTE: This sheet is a summary. It may not cover all possible information. If you have questions about this medicine, talk to your doctor, pharmacist, or health care provider.  2014, Elsevier/Gold Standard. (2012-11-03 14:57:11) Hydroxyurea capsules What is this medicine? HYDROXYUREA (hye drox ee  yoor EE a) is a chemotherapy drug. It slows the growth of cancer cells. This medicine is used to treat certain leukemias, skin cancer, head and neck cancer, and advanced ovarian cancer. It is also used to control the painful crises of sickle cell anemia. This medicine may be used for other purposes; ask your health care provider or pharmacist if you have questions. COMMON BRAND NAME(S): Droxia, Hydrea What should I tell  my health care provider before I take this medicine? They need to know if you have any of these conditions: -immune system problems -infection (especially a virus infection such as chickenpox, cold sores, or herpes) -kidney disease -low blood counts, like low white cell, platelet, or red cell counts -previous or ongoing radiation therapy -an unusual or allergic reaction to hydroxyurea, other chemotherapy, other medicines, foods, dyes, or preservatives -pregnant or trying to get pregnant -breast-feeding How should I use this medicine? Take this medicine by mouth with a glass of water. Follow the directions on the prescription label. Take your medicine at regular intervals. Do not take it more often than directed. Do not stop taking except on your doctor's advice. People who are not taking this medicine should not be exposed to it. Wash your hands before and after handling your bottle or medicine. Caregivers should wear disposable gloves if they must touch the bottle or medicine. Clean up any medicine powder that spills with a damp disposable towel and throw the towel away in a closed container, such as a plastic bag. Talk to your pediatrician regarding the use of this medicine in children. Special care may be needed. Patients over 46 years old may have a stronger reaction and need a smaller dose. Overdosage: If you think you have taken too much of this medicine contact a poison control center or emergency room at once. NOTE: This medicine is only for you. Do not share this medicine with others. What if I miss a dose? If you miss a dose, take it as soon as you can. If it is almost time for your next dose, take only that dose. Do not take double or extra doses. What may interact with this medicine? -didanosine -other chemotherapy agents -stavudine -tenofovir -vaccines This list may not describe all possible interactions. Give your health care provider a list of all the medicines, herbs,  non-prescription drugs, or dietary supplements you use. Also tell them if you smoke, drink alcohol, or use illegal drugs. Some items may interact with your medicine. What should I watch for while using this medicine? This drug may make you feel generally unwell. This is not uncommon, as chemotherapy can affect healthy cells as well as cancer cells. Report any side effects. Continue your course of treatment even though you feel ill unless your doctor tells you to stop. You will receive regular blood tests during your treatment. Call your doctor or health care professional for advice if you get a fever, chills or sore throat, or other symptoms of a cold or flu. Do not treat yourself. This drug decreases your body's ability to fight infections. Try to avoid being around people who are sick. This medicine may increase your risk to bruise or bleed. Call your doctor or health care professional if you notice any unusual bleeding. Be careful brushing and flossing your teeth or using a toothpick because you may get an infection or bleed more easily. If you have any dental work done, tell your dentist you are receiving this medicine. Avoid taking products that contain aspirin,  acetaminophen, ibuprofen, naproxen, or ketoprofen unless instructed by your doctor. These medicines may hide a fever. Do not become pregnant while taking this medicine. Women should inform their doctor if they wish to become pregnant or think they might be pregnant. There is a potential for serious side effects to an unborn child. Men should inform their doctors if they wish to father a child. This medicine may lower sperm counts. Talk to your health care professional or pharmacist for more information. Do not breast-feed an infant while taking this medicine. What side effects may I notice from receiving this medicine? Side effects that you should report to your doctor or health care professional as soon as possible: -allergic reactions like  skin rash, itching or hives, swelling of the face, lips, or tongue -low blood counts - this medicine may decrease the number of white blood cells, red blood cells and platelets. You may be at increased risk for infections and bleeding. -signs of infection - fever or chills, cough, sore throat, pain or difficulty passing urine -signs of decreased platelets or bleeding - bruising, pinpoint red spots on the skin, black, tarry stools, blood in the urine -signs of decreased red blood cells - unusually weak or tired, fainting spells, lightheadedness -breathing problems -burning, redness or pain at the site of any radiation therapy -changes in skin color -confusion -mouth sores -pain, tingling, numbness in the hands or feet -seizures -skin ulcers -trouble passing urine or change in the amount of urine -vomiting Side effects that usually do not require medical attention (report to your doctor or health care professional if they continue or are bothersome): -headache -loss of appetite -red color to the face This list may not describe all possible side effects. Call your doctor for medical advice about side effects. You may report side effects to FDA at 1-800-FDA-1088. Where should I keep my medicine? Keep out of the reach of children. Store at room temperature between 15 and 30 degrees C (59 and 86 degrees F). Keep tightly closed. Throw away any unused medicine after the expiration date. NOTE: This sheet is a summary. It may not cover all possible information. If you have questions about this medicine, talk to your doctor, pharmacist, or health care provider.  2014, Elsevier/Gold Standard. (2007-11-26 15:03:29)

## 2013-07-10 ENCOUNTER — Other Ambulatory Visit (HOSPITAL_COMMUNITY): Payer: Self-pay | Admitting: Psychiatry

## 2013-07-11 NOTE — Telephone Encounter (Signed)
Chart reviewed, medication will be refilled at next appointment 07/13/13.

## 2013-07-13 ENCOUNTER — Encounter (HOSPITAL_COMMUNITY): Payer: Self-pay | Admitting: Psychiatry

## 2013-07-13 ENCOUNTER — Ambulatory Visit (INDEPENDENT_AMBULATORY_CARE_PROVIDER_SITE_OTHER): Payer: Federal, State, Local not specified - PPO | Admitting: Psychiatry

## 2013-07-13 ENCOUNTER — Other Ambulatory Visit (HOSPITAL_COMMUNITY): Payer: Self-pay | Admitting: Family Medicine

## 2013-07-13 ENCOUNTER — Other Ambulatory Visit: Payer: Self-pay | Admitting: Internal Medicine

## 2013-07-13 VITALS — BP 142/81 | HR 63 | Ht 70.0 in | Wt 193.6 lb

## 2013-07-13 DIAGNOSIS — R109 Unspecified abdominal pain: Secondary | ICD-10-CM

## 2013-07-13 DIAGNOSIS — F329 Major depressive disorder, single episode, unspecified: Secondary | ICD-10-CM

## 2013-07-13 DIAGNOSIS — F339 Major depressive disorder, recurrent, unspecified: Secondary | ICD-10-CM

## 2013-07-13 DIAGNOSIS — F4310 Post-traumatic stress disorder, unspecified: Secondary | ICD-10-CM

## 2013-07-13 MED ORDER — LAMOTRIGINE 100 MG PO TABS: ORAL_TABLET | ORAL | Status: DC

## 2013-07-13 MED ORDER — MIRTAZAPINE 15 MG PO TABS: 15.0000 mg | ORAL_TABLET | Freq: Every day | ORAL | Status: DC
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
## 2013-07-15 ENCOUNTER — Ambulatory Visit (HOSPITAL_COMMUNITY)
Admission: RE | Admit: 2013-07-15 | Discharge: 2013-07-15 | Disposition: A | Discharge status: Home / Self Care | Payer: Federal, State, Local not specified - PPO | Source: Ambulatory Visit | Attending: Family Medicine | Admitting: Family Medicine

## 2013-07-15 DIAGNOSIS — R109 Unspecified abdominal pain: Secondary | ICD-10-CM | POA: Insufficient documentation

## 2013-07-15 DIAGNOSIS — D696 Thrombocytopenia, unspecified: Secondary | ICD-10-CM | POA: Insufficient documentation

## 2013-08-03 ENCOUNTER — Telehealth (HOSPITAL_COMMUNITY): Payer: Self-pay | Admitting: Psychiatry

## 2013-08-03 ENCOUNTER — Encounter (HOSPITAL_COMMUNITY): Payer: Self-pay | Admitting: Psychiatry

## 2013-08-03 ENCOUNTER — Ambulatory Visit (INDEPENDENT_AMBULATORY_CARE_PROVIDER_SITE_OTHER): Payer: Federal, State, Local not specified - PPO | Admitting: Psychiatry

## 2013-08-03 ENCOUNTER — Other Ambulatory Visit (HOSPITAL_COMMUNITY): Payer: Self-pay | Admitting: Psychiatry

## 2013-08-03 ENCOUNTER — Telehealth (HOSPITAL_COMMUNITY): Payer: Self-pay | Admitting: *Deleted

## 2013-08-03 DIAGNOSIS — F411 Generalized anxiety disorder: Secondary | ICD-10-CM

## 2013-08-03 DIAGNOSIS — F322 Major depressive disorder, single episode, severe without psychotic features: Secondary | ICD-10-CM

## 2013-08-03 DIAGNOSIS — F329 Major depressive disorder, single episode, unspecified: Secondary | ICD-10-CM

## 2013-08-03 MED ORDER — LAMOTRIGINE 150 MG PO TABS: ORAL_TABLET | ORAL | Status: DC

## 2013-08-03 NOTE — Telephone Encounter (Signed)
New prescription of Lamictal 150 mg given.

## 2013-08-03 NOTE — Progress Notes (Signed)
   THERAPIST PROGRESS NOTE  Session Time: 10:00-10:50  Participation Level: Active  Behavioral Response: CasualAlertEuthymic  Type of Therapy: Individual Therapy  Treatment Goals addressed: emotion regulation, boundary setting, prioritizing self-care  Interventions: CBT  Summary: SHONNA DEITER is a 48 y.o. female who presents with depression, anxiety.   Suicidal/Homicidal: Nowithout intent/plan  Therapist Response: Pt. Presents as talkative, smiles and laughs appropriately. Pt. Reports continues stress at work and in relationships with daughters. Session focused on validating nature of work stress, need to exercise appropriate boundaries in relationships and stop communicating and maintaining "super woman" status to family and co-workers, and priortiizing self-care.  Plan: Pt. Requested follow up regarding medication; she understood from Dr. Adele Schilder that lamictal would be increased to 150 mg but the prescription was still for 100 mg. Encouraged Pt. To focus on slowing her exhale to help manage anxiety between session. Return again in 2 weeks.  Diagnosis: Axis I: Depressive Disorder NOS    Axis II: No diagnosis    Renford Dills 08/03/2013

## 2013-08-03 NOTE — Telephone Encounter (Signed)
Pt in office to see J.Owens Shark, therapist.Pt states Lamictal increased to 150 mg/daily at last appt, but new RX only for 100mg  daily

## 2013-08-10 ENCOUNTER — Telehealth: Payer: Self-pay

## 2013-08-10 NOTE — Telephone Encounter (Signed)
Message copied by Janace Hoard on Wed Aug 10, 2013  3:37 PM ------      Message from: [facility], DAVID      Created: Wed Aug 10, 2013  3:03 PM      Regarding: RE: pt question       Anagrelide can cause abdominal pain rarely?  Not sure if this is GERD as she also mentioned prilosec.              If pain improved with prilosec, please continue her anagrelide as her plts have trended down.   If not improvement despite prilosec, hold anagrelide and we will follow her labs.            Please continue her aspirin if not evidence of bleeding.             Thanks      ----- Message -----         From: Janace Hoard, RN         Sent: 08/10/2013   2:20 PM           To: Concha Norway, MD      Subject: pt question                                              I do not know how urgent this is. Can you direct me in what to tell Lanelle Bal?            ----- Message -----              From: [name]              Sent: 08/10/2013   2:16 AM                To: Mychart Admin           Subject: [id number] about anagrelide and aspirin                             yes i have called several           times and left detailed messages.  i was trying to tell him that i stopped taking the anagrelide because I was experiencing severe upper abdomen pain. on 12/17 i went to my pcp n so much pain she sent me to [facility] for ultrasound even though the test were negative i was still experiencing pain.  dr gates told me to try prilesic otc but to not take aspirin since i knew dr Weber Cooks was one originally prescribed aspirin i was calling to see what i should do. take the aspirin not take aspirin?? take anegrelide or not?!  robin had called me back and then when i tried to call rt back was placed on hold then disconnected happened several times.  then somebody from scheduling called had nothing to do with my calls and then someone else and dont know what she was talking about or what dept she was from.  just want to know what  i should do?           ----- Message -----           From: Nurse Darlen Round           Sent: 08/09/13 10:54 AM  To: [name]           Subject: message about anagrelide and aspirin                       Hello Lanelle Bal, Dr Juliann Mule left me a note that you have been playing phone tag. You last left a message about anagrelide and aspirin but no details. Please call or email in [place]. Thank you Memory Dance RN              ------

## 2013-08-10 NOTE — Telephone Encounter (Signed)
S/w pt. She has not gotten prilosec otc because the 2 times she went to store they were out. I explained omeprazole is the generic equivalent. She has not been taking anagrelide. Her stomach is still achey but less than before. She was concerned b/c Dr Inda Merlin had said to stop ASA when taking prilosec. I explained that if she monitors for bleeding she can take both - blood in urine, petechiae, nose bleeds, gums bleeding. She is going to continue holding anagrelide b/c her stomach is still painful. She will continue with 81 mg ASA. I explained that her plts are trending down and Dr Juliann Mule may want to monitor labs before 2/11 appt. Pt is agreeable.

## 2013-08-17 ENCOUNTER — Ambulatory Visit (HOSPITAL_COMMUNITY): Payer: Self-pay | Admitting: Psychiatry

## 2013-08-29 ENCOUNTER — Other Ambulatory Visit: Payer: Self-pay | Admitting: Internal Medicine

## 2013-08-31 ENCOUNTER — Ambulatory Visit (HOSPITAL_COMMUNITY): Payer: Self-pay | Admitting: Psychiatry

## 2013-09-07 ENCOUNTER — Other Ambulatory Visit (HOSPITAL_BASED_OUTPATIENT_CLINIC_OR_DEPARTMENT_OTHER): Payer: Federal, State, Local not specified - PPO

## 2013-09-07 DIAGNOSIS — D6481 Anemia due to antineoplastic chemotherapy: Secondary | ICD-10-CM

## 2013-09-07 DIAGNOSIS — T451X5A Adverse effect of antineoplastic and immunosuppressive drugs, initial encounter: Secondary | ICD-10-CM

## 2013-09-07 DIAGNOSIS — D473 Essential (hemorrhagic) thrombocythemia: Secondary | ICD-10-CM

## 2013-09-07 LAB — CBC WITH DIFFERENTIAL/PLATELET
BASO%: 1.6 % (ref 0.0–2.0)
BASOS ABS: 0.1 10*3/uL (ref 0.0–0.1)
EOS ABS: 0 10*3/uL (ref 0.0–0.5)
EOS%: 0.8 % (ref 0.0–7.0)
HCT: 34 % — ABNORMAL LOW (ref 34.8–46.6)
HEMOGLOBIN: 10.9 g/dL — AB (ref 11.6–15.9)
LYMPH%: 44.5 % (ref 14.0–49.7)
MCH: 33 pg (ref 25.1–34.0)
MCHC: 32.1 g/dL (ref 31.5–36.0)
MCV: 102.7 fL — AB (ref 79.5–101.0)
MONO#: 0.5 10*3/uL (ref 0.1–0.9)
MONO%: 9.5 % (ref 0.0–14.0)
NEUT%: 43.6 % (ref 38.4–76.8)
NEUTROS ABS: 2.5 10*3/uL (ref 1.5–6.5)
Platelets: 676 10*3/uL — ABNORMAL HIGH (ref 145–400)
RBC: 3.31 10*6/uL — ABNORMAL LOW (ref 3.70–5.45)
RDW: 18.1 % — ABNORMAL HIGH (ref 11.2–14.5)
WBC: 5.7 10*3/uL (ref 3.9–10.3)
lymph#: 2.6 10*3/uL (ref 0.9–3.3)

## 2013-09-14 ENCOUNTER — Ambulatory Visit: Payer: Federal, State, Local not specified - PPO | Attending: Family Medicine

## 2013-09-14 DIAGNOSIS — D691 Qualitative platelet defects: Secondary | ICD-10-CM | POA: Insufficient documentation

## 2013-09-14 DIAGNOSIS — R5381 Other malaise: Secondary | ICD-10-CM | POA: Insufficient documentation

## 2013-09-14 DIAGNOSIS — M545 Low back pain, unspecified: Secondary | ICD-10-CM | POA: Insufficient documentation

## 2013-09-14 DIAGNOSIS — I10 Essential (primary) hypertension: Secondary | ICD-10-CM | POA: Insufficient documentation

## 2013-09-14 DIAGNOSIS — IMO0001 Reserved for inherently not codable concepts without codable children: Secondary | ICD-10-CM | POA: Insufficient documentation

## 2013-09-19 ENCOUNTER — Other Ambulatory Visit: Payer: Self-pay | Admitting: Internal Medicine

## 2013-09-19 DIAGNOSIS — D473 Essential (hemorrhagic) thrombocythemia: Secondary | ICD-10-CM

## 2013-09-19 MED ORDER — ANAGRELIDE HCL 0.5 MG PO CAPS: 0.5000 mg | ORAL_CAPSULE | Freq: Three times a day (TID) | ORAL | Status: DC

## 2013-09-21 ENCOUNTER — Ambulatory Visit: Payer: Federal, State, Local not specified - PPO

## 2013-09-28 ENCOUNTER — Ambulatory Visit: Payer: Federal, State, Local not specified - PPO | Attending: Family Medicine

## 2013-09-28 DIAGNOSIS — M545 Low back pain, unspecified: Secondary | ICD-10-CM | POA: Insufficient documentation

## 2013-09-28 DIAGNOSIS — R5381 Other malaise: Secondary | ICD-10-CM | POA: Insufficient documentation

## 2013-09-28 DIAGNOSIS — D691 Qualitative platelet defects: Secondary | ICD-10-CM | POA: Insufficient documentation

## 2013-09-28 DIAGNOSIS — IMO0001 Reserved for inherently not codable concepts without codable children: Secondary | ICD-10-CM | POA: Insufficient documentation

## 2013-09-28 DIAGNOSIS — I10 Essential (primary) hypertension: Secondary | ICD-10-CM | POA: Insufficient documentation

## 2013-10-05 ENCOUNTER — Ambulatory Visit: Payer: Federal, State, Local not specified - PPO

## 2013-10-12 ENCOUNTER — Ambulatory Visit: Payer: Federal, State, Local not specified - PPO | Admitting: Physical Therapy

## 2013-10-12 ENCOUNTER — Ambulatory Visit (HOSPITAL_COMMUNITY): Payer: Self-pay | Admitting: Psychiatry

## 2013-10-17 ENCOUNTER — Other Ambulatory Visit: Payer: Self-pay | Admitting: Internal Medicine

## 2013-10-19 ENCOUNTER — Encounter (HOSPITAL_COMMUNITY): Payer: Self-pay | Admitting: Psychiatry

## 2013-10-19 ENCOUNTER — Ambulatory Visit (INDEPENDENT_AMBULATORY_CARE_PROVIDER_SITE_OTHER): Payer: Federal, State, Local not specified - PPO | Admitting: Psychiatry

## 2013-10-19 ENCOUNTER — Other Ambulatory Visit: Payer: Self-pay | Admitting: Medical Oncology

## 2013-10-19 ENCOUNTER — Ambulatory Visit: Payer: Federal, State, Local not specified - PPO

## 2013-10-19 VITALS — BP 155/95 | HR 74 | Ht 70.0 in | Wt 195.0 lb

## 2013-10-19 DIAGNOSIS — F339 Major depressive disorder, recurrent, unspecified: Secondary | ICD-10-CM

## 2013-10-19 DIAGNOSIS — F4310 Post-traumatic stress disorder, unspecified: Secondary | ICD-10-CM

## 2013-10-19 DIAGNOSIS — F329 Major depressive disorder, single episode, unspecified: Secondary | ICD-10-CM

## 2013-10-19 MED ORDER — CLONAZEPAM 0.5 MG PO TABS: 0.5000 mg | ORAL_TABLET | Freq: Every day | ORAL | Status: DC

## 2013-10-19 MED ORDER — LAMOTRIGINE 150 MG PO TABS: ORAL_TABLET | ORAL | Status: DC

## 2013-10-19 MED ORDER — HYDROXYUREA 500 MG PO CAPS: ORAL_CAPSULE | ORAL | Status: DC

## 2013-10-19 MED ORDER — MIRTAZAPINE 15 MG PO TABS: 15.0000 mg | ORAL_TABLET | Freq: Every day | ORAL | Status: DC
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
## 2013-10-21 ENCOUNTER — Other Ambulatory Visit: Payer: Self-pay | Admitting: Internal Medicine

## 2013-10-26 ENCOUNTER — Other Ambulatory Visit: Payer: Self-pay | Admitting: *Deleted

## 2013-10-26 ENCOUNTER — Ambulatory Visit: Payer: Federal, State, Local not specified - PPO | Admitting: Physical Therapy

## 2013-10-26 NOTE — Telephone Encounter (Signed)
Refill request for anagrelide 0.5 mg to MD desk for approval. Last platelet count was higher. Next lab due 11/02/13. Will send My Chart message to confirm [name] is taking as directed. Unable to reach her by home phone.

## 2013-11-02 ENCOUNTER — Other Ambulatory Visit (HOSPITAL_BASED_OUTPATIENT_CLINIC_OR_DEPARTMENT_OTHER): Payer: Federal, State, Local not specified - PPO

## 2013-11-02 DIAGNOSIS — D473 Essential (hemorrhagic) thrombocythemia: Secondary | ICD-10-CM

## 2013-11-02 LAB — CBC WITH DIFFERENTIAL/PLATELET
BASO%: 1.5 % (ref 0.0–2.0)
Basophils Absolute: 0.1 10*3/uL (ref 0.0–0.1)
EOS ABS: 0.1 10*3/uL (ref 0.0–0.5)
EOS%: 1.3 % (ref 0.0–7.0)
HCT: 33.3 % — ABNORMAL LOW (ref 34.8–46.6)
HGB: 10.7 g/dL — ABNORMAL LOW (ref 11.6–15.9)
LYMPH%: 42.7 % (ref 14.0–49.7)
MCH: 32 pg (ref 25.1–34.0)
MCHC: 32 g/dL (ref 31.5–36.0)
MCV: 99.9 fL (ref 79.5–101.0)
MONO#: 0.4 10*3/uL (ref 0.1–0.9)
MONO%: 7.8 % (ref 0.0–14.0)
NEUT%: 46.7 % (ref 38.4–76.8)
NEUTROS ABS: 2.1 10*3/uL (ref 1.5–6.5)
PLATELETS: 626 10*3/uL — AB (ref 145–400)
RBC: 3.34 10*6/uL — ABNORMAL LOW (ref 3.70–5.45)
RDW: 18.7 % — ABNORMAL HIGH (ref 11.2–14.5)
WBC: 4.5 10*3/uL (ref 3.9–10.3)
lymph#: 1.9 10*3/uL (ref 0.9–3.3)

## 2013-11-02 LAB — COMPREHENSIVE METABOLIC PANEL (CC13)
ALK PHOS: 84 U/L (ref 40–150)
ALT: 12 U/L (ref 0–55)
ANION GAP: 8 meq/L (ref 3–11)
AST: 18 U/L (ref 5–34)
Albumin: 4.1 g/dL (ref 3.5–5.0)
BILIRUBIN TOTAL: 0.34 mg/dL (ref 0.20–1.20)
BUN: 13.5 mg/dL (ref 7.0–26.0)
CO2: 26 mEq/L (ref 22–29)
CREATININE: 1.1 mg/dL (ref 0.6–1.1)
Calcium: 9.6 mg/dL (ref 8.4–10.4)
Chloride: 106 mEq/L (ref 98–109)
GLUCOSE: 95 mg/dL (ref 70–140)
Potassium: 4.1 mEq/L (ref 3.5–5.1)
SODIUM: 140 meq/L (ref 136–145)
TOTAL PROTEIN: 7.4 g/dL (ref 6.4–8.3)

## 2013-11-15 ENCOUNTER — Other Ambulatory Visit (HOSPITAL_COMMUNITY): Payer: Self-pay | Admitting: Psychiatry

## 2013-11-16 ENCOUNTER — Other Ambulatory Visit (HOSPITAL_COMMUNITY): Payer: Self-pay | Admitting: Psychiatry

## 2013-11-16 DIAGNOSIS — F329 Major depressive disorder, single episode, unspecified: Secondary | ICD-10-CM

## 2013-12-06 ENCOUNTER — Ambulatory Visit (HOSPITAL_COMMUNITY): Payer: Self-pay | Admitting: Psychiatry

## 2013-12-06 ENCOUNTER — Encounter (HOSPITAL_COMMUNITY): Payer: Self-pay | Admitting: Psychiatry

## 2013-12-06 ENCOUNTER — Ambulatory Visit (INDEPENDENT_AMBULATORY_CARE_PROVIDER_SITE_OTHER): Payer: Federal, State, Local not specified - PPO | Admitting: Psychiatry

## 2013-12-06 VITALS — BP 140/75 | HR 61 | Ht 70.0 in | Wt 191.4 lb

## 2013-12-06 DIAGNOSIS — F329 Major depressive disorder, single episode, unspecified: Secondary | ICD-10-CM

## 2013-12-06 DIAGNOSIS — F4310 Post-traumatic stress disorder, unspecified: Secondary | ICD-10-CM

## 2013-12-06 DIAGNOSIS — F339 Major depressive disorder, recurrent, unspecified: Secondary | ICD-10-CM

## 2013-12-06 MED ORDER — MIRTAZAPINE 30 MG PO TABS: 30.0000 mg | ORAL_TABLET | Freq: Every day | ORAL | Status: DC

## 2013-12-06 MED ORDER — CLONAZEPAM 0.5 MG PO TABS: ORAL_TABLET | ORAL | Status: DC

## 2013-12-06 MED ORDER — LAMOTRIGINE 200 MG PO TABS: ORAL_TABLET | ORAL | Status: DC
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
## 2013-12-08 ENCOUNTER — Telehealth (HOSPITAL_COMMUNITY): Payer: Self-pay | Admitting: *Deleted

## 2013-12-08 NOTE — Telephone Encounter (Signed)
Patient left VM:Saw the doctor yesterday.He recommended IOP, she did not want to.Saw her therapist ([facility]) and he also recommended IOP.She agreed.Please call and tell her what to do. Informed Dr. Adele Schilder of patient's call.He asked she be contacted by CM for IOP, R.Clark, MEd. Ms. Carlis Abbott out of office at this time, will contact 5/14 with patient contact information. Contacted patient and informed her that her information will be given to Ms. Carlis Abbott

## 2013-12-13 ENCOUNTER — Other Ambulatory Visit (HOSPITAL_COMMUNITY): Payer: Self-pay | Admitting: Psychiatry

## 2013-12-20 ENCOUNTER — Other Ambulatory Visit (HOSPITAL_COMMUNITY): Payer: Federal, State, Local not specified - PPO | Attending: Psychiatry | Admitting: Psychiatry

## 2013-12-20 ENCOUNTER — Encounter (HOSPITAL_COMMUNITY): Payer: Self-pay

## 2013-12-20 DIAGNOSIS — Z598 Other problems related to housing and economic circumstances: Secondary | ICD-10-CM | POA: Insufficient documentation

## 2013-12-20 DIAGNOSIS — I10 Essential (primary) hypertension: Secondary | ICD-10-CM | POA: Insufficient documentation

## 2013-12-20 DIAGNOSIS — F410 Panic disorder [episodic paroxysmal anxiety] without agoraphobia: Secondary | ICD-10-CM | POA: Insufficient documentation

## 2013-12-20 DIAGNOSIS — F4310 Post-traumatic stress disorder, unspecified: Secondary | ICD-10-CM

## 2013-12-20 DIAGNOSIS — Z7982 Long term (current) use of aspirin: Secondary | ICD-10-CM | POA: Insufficient documentation

## 2013-12-20 DIAGNOSIS — Z5989 Other problems related to housing and economic circumstances: Secondary | ICD-10-CM | POA: Insufficient documentation

## 2013-12-20 DIAGNOSIS — F331 Major depressive disorder, recurrent, moderate: Secondary | ICD-10-CM

## 2013-12-20 DIAGNOSIS — D473 Essential (hemorrhagic) thrombocythemia: Secondary | ICD-10-CM | POA: Insufficient documentation

## 2013-12-20 DIAGNOSIS — Z5987 Material hardship due to limited financial resources, not elsewhere classified: Secondary | ICD-10-CM | POA: Insufficient documentation

## 2013-12-20 DIAGNOSIS — Z79899 Other long term (current) drug therapy: Secondary | ICD-10-CM | POA: Insufficient documentation

## 2013-12-20 DIAGNOSIS — F411 Generalized anxiety disorder: Secondary | ICD-10-CM | POA: Insufficient documentation

## 2013-12-20 DIAGNOSIS — F332 Major depressive disorder, recurrent severe without psychotic features: Secondary | ICD-10-CM | POA: Insufficient documentation

## 2013-12-20 MED ORDER — RISPERIDONE 1 MG PO TABS: 1.0000 mg | ORAL_TABLET | Freq: Every day | ORAL | Status: DC
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
## 2013-12-21 ENCOUNTER — Other Ambulatory Visit (HOSPITAL_COMMUNITY): Payer: Federal, State, Local not specified - PPO | Admitting: Psychiatry

## 2013-12-21 ENCOUNTER — Encounter (HOSPITAL_COMMUNITY): Payer: Self-pay | Admitting: Psychiatry

## 2013-12-21 ENCOUNTER — Other Ambulatory Visit (HOSPITAL_COMMUNITY): Payer: Self-pay | Admitting: Psychiatry

## 2013-12-21 DIAGNOSIS — F331 Major depressive disorder, recurrent, moderate: Secondary | ICD-10-CM
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
## 2013-12-21 NOTE — Telephone Encounter (Signed)
Given on 12/06/13. Too soon to refill

## 2013-12-21 NOTE — Progress Notes (Signed)
    Daily Group Progress Note  Program: IOP  Group Time: 9:00-10:30  Participation Level: Active  Behavioral Response: Appropriate  Type of Therapy:  Group Therapy  Summary of Progress: Pt. Met with case manager and psychiatrist.     Group Time: 10:30-12:00  Participation Level:  Active  Behavioral Response: Appropriate  Type of Therapy: Psycho-education Group  Summary of Progress: Pt. Met with case manager and psychiatrist.  [facility]

## 2013-12-22 ENCOUNTER — Other Ambulatory Visit (HOSPITAL_COMMUNITY): Payer: Federal, State, Local not specified - PPO

## 2013-12-23 ENCOUNTER — Encounter (HOSPITAL_COMMUNITY): Payer: Self-pay | Admitting: Psychiatry

## 2013-12-23 ENCOUNTER — Other Ambulatory Visit (HOSPITAL_COMMUNITY): Payer: Federal, State, Local not specified - PPO | Admitting: Psychiatry

## 2013-12-23 DIAGNOSIS — F331 Major depressive disorder, recurrent, moderate: Secondary | ICD-10-CM

## 2013-12-23 NOTE — Progress Notes (Signed)
    Daily Group Progress Note  Program: IOP  Group Time: 9:00-10:30  Participation Level: Active  Behavioral Response: Appropriate  Type of Therapy:  Group Therapy  Summary of Progress: Pt. Participated in meditation exercise. Pt. Presents as tearful and overwhelmed. Pt. States that she does not have a reason for feeling sad and tearful. Pt. Discussed work stress, not feeling validated for her strengths at work. Pt. Discussed disconnect from her mother and painful relationships with daughters.     Group Time: 10:30-12:00  Participation Level:  Active  Behavioral Response: Appropriate  Type of Therapy: Psycho-education Group  Summary of Progress: Pt. Participated in discussion about validating the feelings of others in interpersonal communication.  Nancie Neas, COUNS

## 2013-12-26 ENCOUNTER — Other Ambulatory Visit (HOSPITAL_COMMUNITY): Payer: Federal, State, Local not specified - PPO | Attending: Psychiatry | Admitting: Psychiatry

## 2013-12-26 ENCOUNTER — Encounter (HOSPITAL_COMMUNITY): Payer: Self-pay | Admitting: Psychiatry

## 2013-12-26 DIAGNOSIS — F4310 Post-traumatic stress disorder, unspecified: Secondary | ICD-10-CM | POA: Insufficient documentation

## 2013-12-26 DIAGNOSIS — Z598 Other problems related to housing and economic circumstances: Secondary | ICD-10-CM | POA: Insufficient documentation

## 2013-12-26 DIAGNOSIS — F410 Panic disorder [episodic paroxysmal anxiety] without agoraphobia: Secondary | ICD-10-CM | POA: Insufficient documentation

## 2013-12-26 DIAGNOSIS — Z7982 Long term (current) use of aspirin: Secondary | ICD-10-CM | POA: Insufficient documentation

## 2013-12-26 DIAGNOSIS — Z5989 Other problems related to housing and economic circumstances: Secondary | ICD-10-CM | POA: Insufficient documentation

## 2013-12-26 DIAGNOSIS — F331 Major depressive disorder, recurrent, moderate: Secondary | ICD-10-CM

## 2013-12-26 DIAGNOSIS — F411 Generalized anxiety disorder: Secondary | ICD-10-CM | POA: Insufficient documentation

## 2013-12-26 DIAGNOSIS — Z79899 Other long term (current) drug therapy: Secondary | ICD-10-CM | POA: Insufficient documentation

## 2013-12-26 DIAGNOSIS — I10 Essential (primary) hypertension: Secondary | ICD-10-CM | POA: Insufficient documentation

## 2013-12-26 DIAGNOSIS — Z5987 Material hardship due to limited financial resources, not elsewhere classified: Secondary | ICD-10-CM | POA: Insufficient documentation

## 2013-12-26 DIAGNOSIS — D473 Essential (hemorrhagic) thrombocythemia: Secondary | ICD-10-CM | POA: Insufficient documentation

## 2013-12-26 DIAGNOSIS — F332 Major depressive disorder, recurrent severe without psychotic features: Secondary | ICD-10-CM | POA: Insufficient documentation

## 2013-12-26 NOTE — Progress Notes (Signed)
    Daily Group Progress Note  Program: IOP  Group Time: 9:00-10:30  Participation Level: Active  Behavioral Response: Appropriate  Type of Therapy:  Group Therapy  Summary of Progress: Pt. Participated in meditation practice. Pt. Reported that she is feeling better, but continues to feel overwhelmed by challenge of organizing and cleaning her home. Pt. Reported that she was able to ask her mother for help cleaning her home because she is accepting that she needs help and finding the courage to ask for the help that she needs.     Group Time: 10:30-12:00  Participation Level:  Active  Behavioral Response: Appropriate  Type of Therapy: Psycho-education Group  Summary of Progress: Pt. Participated in grief and loss group facilitated by Jeanella Craze.  Nancie Neas, COUNS

## 2013-12-27 ENCOUNTER — Encounter (HOSPITAL_COMMUNITY): Payer: Self-pay | Admitting: Psychiatry

## 2013-12-27 ENCOUNTER — Other Ambulatory Visit (HOSPITAL_COMMUNITY): Payer: Federal, State, Local not specified - PPO | Admitting: Psychiatry

## 2013-12-27 DIAGNOSIS — F331 Major depressive disorder, recurrent, moderate: Secondary | ICD-10-CM

## 2013-12-27 NOTE — Progress Notes (Signed)
    Daily Group Progress Note  Program: IOP  Group Time: 9:00-10:30  Participation Level: Active  Behavioral Response: Appropriate  Type of Therapy:  Group Therapy  Summary of Progress: Pt. Participated in meditation exercise. Pt. Continuing to process relationship with supervisor, feeling that her expertise is not valued by her supervisor and that she does not get the necessary support to do her job at work. Pt. Also processes relationship with her eldest daughter and fears of being distanced from her grandson as a significant stressor.     Group Time: 10:30-12:00  Participation Level:  Active  Behavioral Response: Appropriate  Type of Therapy: Psycho-education Group  Summary of Progress: Pt. Participated in discussion about developing self-compassion using Kristin Neff's model.  Nancie Neas, COUNS

## 2013-12-28 ENCOUNTER — Other Ambulatory Visit (HOSPITAL_COMMUNITY): Payer: Federal, State, Local not specified - PPO | Admitting: Psychiatry

## 2013-12-28 ENCOUNTER — Encounter (HOSPITAL_COMMUNITY): Payer: Self-pay | Admitting: Psychiatry

## 2013-12-28 NOTE — Progress Notes (Signed)
    Daily Group Progress Note  Program: IOP  Group Time: 9:00-10:30  Participation Level: Active  Behavioral Response: Appropriate  Type of Therapy:  Group Therapy  Summary of Progress: Pt. Participated in meditation exercise. Pt. reported that she was having a "good day today". Pt. Reported that a friend from work helped her with yard work and took her out to lunch which made her feel encouraged and as if someone cared about her. Pt. Continues to work on coping skills to manage anger towards her manager and eldest daughter.     Group Time: 10:30-12:00  Participation Level:  Active  Behavioral Response: Appropriate  Type of Therapy: Psycho-education Group  Summary of Progress: Pt. Participated in group on developing vulnerability and shame resilience; watched [employer] video.  Nancie Neas, COUNS

## 2013-12-29 ENCOUNTER — Other Ambulatory Visit (HOSPITAL_COMMUNITY): Payer: Federal, State, Local not specified - PPO | Admitting: Psychiatry

## 2013-12-29 DIAGNOSIS — F331 Major depressive disorder, recurrent, moderate: Secondary | ICD-10-CM

## 2013-12-30 ENCOUNTER — Encounter (HOSPITAL_COMMUNITY): Payer: Self-pay | Admitting: Psychiatry

## 2013-12-30 ENCOUNTER — Other Ambulatory Visit (HOSPITAL_COMMUNITY): Payer: Federal, State, Local not specified - PPO | Admitting: Psychiatry

## 2013-12-30 DIAGNOSIS — F331 Major depressive disorder, recurrent, moderate: Secondary | ICD-10-CM

## 2013-12-30 NOTE — Progress Notes (Signed)
    Daily Group Progress Note  Program: IOP  Group Time: 9:00-10:30  Participation Level: Active  Behavioral Response: Appropriate  Type of Therapy:  Group Therapy  Summary of Progress: Pt. Reported that she was feeling "ok" with some anxiety about returning to work next week. Pt. Reports that she will return to work hopeful about upcoming training and change of work environment. Pt. Continues to complain about lack of motivation to make behavioral changes such as getting exercise and performing household chores.     Group Time: 10:30-12:00  Participation Level:  Active  Behavioral Response: Appropriate  Type of Therapy: Psycho-education Group  Summary of Progress: Pt. Participated in group about developing self-compassion; watched Marilynn Latino video.  Nancie Neas, COUNS

## 2013-12-30 NOTE — Progress Notes (Signed)
    Daily Group Progress Note  Program: IOP  Group Time: 9:00-10:30  Participation Level: Active  Behavioral Response: Appropriate  Type of Therapy:  Group Therapy  Summary of Progress: Pt. Participated in meditation exercise. Pt. Reports that she is doing "ok". Pt tends to be confrontational and challenging of other group members needing significant redirection. Pt. Continues to report major stressors are job stress, but encouraged by opportunity to go to [place] for job training which would allow her distance from current supervisor. Pt. Continues to suffer from contentious relationship with her eldest daughter with poor insight into how she can better communicate with her.     Group Time: 10:30-12:00  Participation Level:  Active  Behavioral Response: Appropriate  Type of Therapy: Psycho-education Group  Summary of Progress: Pt. Participated in discussion about developing self-compassion.  Nancie Neas, COUNS

## 2014-01-01 ENCOUNTER — Other Ambulatory Visit (HOSPITAL_COMMUNITY): Payer: Self-pay | Admitting: Psychiatry

## 2014-01-02 ENCOUNTER — Other Ambulatory Visit (HOSPITAL_COMMUNITY): Payer: Self-pay | Admitting: Psychiatry

## 2014-01-02 ENCOUNTER — Other Ambulatory Visit (HOSPITAL_COMMUNITY): Payer: Federal, State, Local not specified - PPO | Admitting: Psychiatry

## 2014-01-02 DIAGNOSIS — F331 Major depressive disorder, recurrent, moderate: Secondary | ICD-10-CM

## 2014-01-02 NOTE — Progress Notes (Signed)
    Daily Group Progress Note  Program: IOP  Group Time: 9:00-10:30 am  Participation Level: Active  Behavioral Response: Monopolizing  Type of Therapy:  Process Group  Summary of Progress: Patient was very argumentative in both groups today.  Has the tendency to monopolize all the group time.  Pt discussed her frustration and anger of how her daughters doesn't want anything to do with her.  Pt is playing the victim role and doesn't take any responsibility for her actions.     Group Time: 10:45-12n  Participation Level:  Active  Behavioral Response: Monopolizing and Intrusive  Type of Therapy: Psycho-education Group  Summary of Progress: Discussed setting treatment goals, assessing current resources; while exploring new ones, safety plans once discharged, and arranging aftercare plans.        [facility]

## 2014-01-02 NOTE — Telephone Encounter (Signed)
Patient is in IOP and meds she should be given by IOP psychiatrist.

## 2014-01-03 ENCOUNTER — Encounter (HOSPITAL_COMMUNITY): Payer: Self-pay | Admitting: Psychiatry

## 2014-01-03 ENCOUNTER — Other Ambulatory Visit (HOSPITAL_COMMUNITY): Payer: Federal, State, Local not specified - PPO | Admitting: Psychiatry

## 2014-01-03 DIAGNOSIS — F329 Major depressive disorder, single episode, unspecified: Secondary | ICD-10-CM

## 2014-01-03 MED ORDER — LAMOTRIGINE 200 MG PO TABS: ORAL_TABLET | ORAL | Status: DC

## 2014-01-03 MED ORDER — MIRTAZAPINE 30 MG PO TABS: 30.0000 mg | ORAL_TABLET | Freq: Every day | ORAL | Status: DC

## 2014-01-03 MED ORDER — RISPERIDONE 1 MG PO TABS: 1.0000 mg | ORAL_TABLET | Freq: Every day | ORAL | Status: DC

## 2014-01-03 MED ORDER — CLONAZEPAM 0.5 MG PO TABS: ORAL_TABLET | ORAL | Status: DC
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
## 2014-01-03 NOTE — Patient Instructions (Signed)
Patient completed MH-IOP today.  Will follow up with Dr. Adele Schilder on 01-18-14 @ 1:45 pm and [name], [facility] on 03-16-14 @ 1pm.  Plans to see EAP until she sees [place].  Will return to work later this week.

## 2014-01-04 ENCOUNTER — Ambulatory Visit (HOSPITAL_BASED_OUTPATIENT_CLINIC_OR_DEPARTMENT_OTHER): Payer: Federal, State, Local not specified - PPO | Admitting: Internal Medicine

## 2014-01-04 ENCOUNTER — Telehealth: Payer: Self-pay | Admitting: Internal Medicine

## 2014-01-04 ENCOUNTER — Encounter (HOSPITAL_COMMUNITY): Payer: Self-pay | Admitting: Psychiatry

## 2014-01-04 ENCOUNTER — Encounter: Payer: Self-pay | Admitting: Internal Medicine

## 2014-01-04 ENCOUNTER — Other Ambulatory Visit (HOSPITAL_BASED_OUTPATIENT_CLINIC_OR_DEPARTMENT_OTHER): Payer: Federal, State, Local not specified - PPO

## 2014-01-04 VITALS — BP 107/70 | HR 55 | Temp 96.4°F | Resp 18 | Ht 70.0 in | Wt 193.2 lb

## 2014-01-04 DIAGNOSIS — D6481 Anemia due to antineoplastic chemotherapy: Secondary | ICD-10-CM

## 2014-01-04 DIAGNOSIS — T451X5A Adverse effect of antineoplastic and immunosuppressive drugs, initial encounter: Secondary | ICD-10-CM

## 2014-01-04 DIAGNOSIS — D473 Essential (hemorrhagic) thrombocythemia: Secondary | ICD-10-CM

## 2014-01-04 DIAGNOSIS — F411 Generalized anxiety disorder: Secondary | ICD-10-CM

## 2014-01-04 LAB — CBC WITH DIFFERENTIAL/PLATELET
BASO%: 1.6 % (ref 0.0–2.0)
Basophils Absolute: 0.1 10*3/uL (ref 0.0–0.1)
EOS%: 1.7 % (ref 0.0–7.0)
Eosinophils Absolute: 0.1 10*3/uL (ref 0.0–0.5)
HCT: 34.1 % — ABNORMAL LOW (ref 34.8–46.6)
HGB: 10.9 g/dL — ABNORMAL LOW (ref 11.6–15.9)
LYMPH%: 49.1 % (ref 14.0–49.7)
MCH: 32.6 pg (ref 25.1–34.0)
MCHC: 32.1 g/dL (ref 31.5–36.0)
MCV: 101.6 fL — ABNORMAL HIGH (ref 79.5–101.0)
MONO#: 0.3 10*3/uL (ref 0.1–0.9)
MONO%: 6.7 % (ref 0.0–14.0)
NEUT#: 1.7 10*3/uL (ref 1.5–6.5)
NEUT%: 40.9 % (ref 38.4–76.8)
Platelets: 509 10*3/uL — ABNORMAL HIGH (ref 145–400)
RBC: 3.36 10*6/uL — ABNORMAL LOW (ref 3.70–5.45)
RDW: 20.1 % — ABNORMAL HIGH (ref 11.2–14.5)
WBC: 4.2 10*3/uL (ref 3.9–10.3)
lymph#: 2.1 10*3/uL (ref 0.9–3.3)

## 2014-01-04 LAB — COMPREHENSIVE METABOLIC PANEL (CC13)
ALT: 16 U/L (ref 0–55)
AST: 18 U/L (ref 5–34)
Albumin: 4 g/dL (ref 3.5–5.0)
Alkaline Phosphatase: 77 U/L (ref 40–150)
Anion Gap: 9 mEq/L (ref 3–11)
BUN: 10.1 mg/dL (ref 7.0–26.0)
CALCIUM: 9.6 mg/dL (ref 8.4–10.4)
CO2: 28 meq/L (ref 22–29)
Chloride: 106 mEq/L (ref 98–109)
Creatinine: 1.3 mg/dL — ABNORMAL HIGH (ref 0.6–1.1)
Glucose: 112 mg/dl (ref 70–140)
POTASSIUM: 3.7 meq/L (ref 3.5–5.1)
Sodium: 144 mEq/L (ref 136–145)
TOTAL PROTEIN: 7.1 g/dL (ref 6.4–8.3)
Total Bilirubin: 0.38 mg/dL (ref 0.20–1.20)

## 2014-01-04 NOTE — Progress Notes (Signed)
    Daily Group Progress Note  Program: IOP  Group Time: 9:00-10:30  Participation Level: Active  Behavioral Response: Appropriate  Type of Therapy:  Group Therapy  Summary of Progress: Pt. Prepared for discharge today. Pt. Continues to be challenged by disorganization of her home, processed the purpose that her home disorganization might be serving in her life such as supporting her tendency to isolate, brainstormed small changes that will encourage significant outcomes, and to avoid perfectionistic and all-or-nothing thinking.     Group Time: 10:30-12:00  Participation Level:  Active  Behavioral Response: Appropriate  Type of Therapy: Psycho-education Group  Summary of Progress: Pt. Was alert and attentive in grief and loss facilitated by Jeanella Craze.  Nancie Neas, COUNS

## 2014-01-04 NOTE — Progress Notes (Signed)
[place] OFFICE PROGRESS NOTE  Wendy Smolder, MD [address] [place] [address]  DIAGNOSIS: Essential thrombocythemia  Chief Complaint  Patient presents with  . Follow-up   CURRENT THERAPY: Hydroxyurea 1000 mg daily for 5 days and 1500 mg on Tuesdays and Fridays. Hydroxyurea was started in late May 2006. Anagrelide 0.5 mg twice daily (since taking this in over one year due to concerns about abdominal discomfort).   INTERVAL HISTORY: [name] 48 y.o. female with a history of poor compliance and essential thrombocythemia is here for follow-up.  She was last seen by me on 07/06/2013. She reports that she has been compliant with her hydroxyurea but reports intermittently taking the anagrelide due to abdominal discomfort.  She has  been able to return for follow up labs on a every other month basis.   Her last labs were drawn in April revealing a plt count of 626.   She reports compliance to her blood pressure medication.   She is also being followed for anxiety/depression by her psychiatrist Dr. Dwyane Dee and counselors Owens Shark and Ysidro Evert.    MEDICAL HISTORY: Past Medical History  Diagnosis Date  . Essential thrombocythemia   . Depression   . Anxiety   . PTSD (post-traumatic stress disorder)     INTERIM HISTORY: has Essential thrombocythemia; Unspecified deficiency anemia; Suicidal ideation; MDD (major depressive disorder), single episode, severe , no psychosis; and GAD (generalized anxiety disorder) on her problem list.    ALLERGIES:  has No Known Allergies.  MEDICATIONS: has a current medication list which includes the following prescription(s): anagrelide, aspirin, clonazepam, hydroxyurea, lamotrigine, losartan-hydrochlorothiazide, meclizine, metoprolol tartrate, mirtazapine, multivitamin, and risperidone.  SURGICAL HISTORY: No past surgical history on file.  PROBLEM LIST:  1. Essential thrombocythemia with diagnosis going back at least  to April 2006. The patient underwent a bone marrow on 12/10/2004. JAK2 mutation was not detected. At the time of diagnosis platelet count was 1.3 million without obvious explanation. [name] has been on hydroxyurea since May 2006. At one time she was on anagrelide 0.5 mg daily. However, apparently [name] stopped this. The patient's compliance has been generally poor. Fortunately she has not suffered any thrombotic complications related to her diagnosis. Anagrelide was restarted on 03/26/2012.  2. Depression, currently under treatment.  3. History of chronic headaches, possibly migraine headaches.  4. Hypertension.  5. Poor compliance.  REVIEW OF SYSTEMS:   Constitutional: Denies fevers, chills or abnormal weight loss Eyes: Denies blurriness of vision Ears, nose, mouth, throat, and face: Denies mucositis or sore throat Respiratory: Denies cough, dyspnea or wheezes Cardiovascular: Denies palpitation, chest discomfort or lower extremity swelling Gastrointestinal:  Denies nausea, heartburn or change in bowel habits Skin: Denies abnormal skin rashes Lymphatics: Denies new lymphadenopathy or easy bruising Neurological:Denies numbness, tingling or new weaknesses Behavioral/Psych: Mood is stable, no new changes  All other systems were reviewed with the patient and are negative.  PHYSICAL EXAMINATION: ECOG PERFORMANCE STATUS: 0 - Asymptomatic  Blood pressure 107/70, pulse 55, temperature 96.4 F (35.8 C), temperature source Oral, resp. rate 18, height 5\' 10"  (1.778 m), weight 193 lb 3.2 oz (87.635 kg).  GENERAL:alert, no distress and comfortable, well developed and well nourished SKIN: skin color, texture, turgor are normal, no rashes or significant lesions EYES: normal, Conjunctiva are pink and non-injected, sclera clear OROPHARYNX:no exudate, no erythema and lips, buccal mucosa, and tongue normal  NECK: supple, thyroid normal size, non-tender, without nodularity LYMPH:  no palpable  lymphadenopathy in the cervical,  axillary or supraclavicular LUNGS: clear to auscultation and percussion with normal breathing effort HEART: regular rate & rhythm and no murmurs and no lower extremity edema ABDOMEN:abdomen soft, non-tender and normal bowel sounds Musculoskeletal:no cyanosis of digits and no clubbing  NEURO: alert & oriented x 3 with fluent speech, no focal motor/sensory deficits  LABORATORY DATA: Results for orders placed in visit on 01/04/14 (from the past 48 hour(s))  COMPREHENSIVE METABOLIC PANEL (ZO10)     Status: Abnormal   Collection Time    01/04/14 10:04 AM      Result Value Ref Range   Sodium 144  136 - 145 mEq/L   Potassium 3.7  3.5 - 5.1 mEq/L   Chloride 106  98 - 109 mEq/L   CO2 28  22 - 29 mEq/L   Glucose 112  70 - 140 mg/dl   BUN 10.1  7.0 - 26.0 mg/dL   Creatinine 1.3 (*) 0.6 - 1.1 mg/dL   Total Bilirubin 0.38  0.20 - 1.20 mg/dL   Alkaline Phosphatase 77  40 - 150 U/L   AST 18  5 - 34 U/L   ALT 16  0 - 55 U/L   Total Protein 7.1  6.4 - 8.3 g/dL   Albumin 4.0  3.5 - 5.0 g/dL   Calcium 9.6  8.4 - 10.4 mg/dL   Anion Gap 9  3 - 11 mEq/L  CBC WITH DIFFERENTIAL     Status: Abnormal   Collection Time    01/04/14 10:04 AM      Result Value Ref Range   WBC 4.2  3.9 - 10.3 10e3/uL   NEUT# 1.7  1.5 - 6.5 10e3/uL   HGB 10.9 (*) 11.6 - 15.9 g/dL   HCT 34.1 (*) 34.8 - 46.6 %   Platelets 509 (*) 145 - 400 10e3/uL   MCV 101.6 (*) 79.5 - 101.0 fL   MCH 32.6  25.1 - 34.0 pg   MCHC 32.1  31.5 - 36.0 g/dL   RBC 3.36 (*) 3.70 - 5.45 10e6/uL   RDW 20.1 (*) 11.2 - 14.5 %   lymph# 2.1  0.9 - 3.3 10e3/uL   MONO# 0.3  0.1 - 0.9 10e3/uL   Eosinophils Absolute 0.1  0.0 - 0.5 10e3/uL   Basophils Absolute 0.1  0.0 - 0.1 10e3/uL   NEUT% 40.9  38.4 - 76.8 %   LYMPH% 49.1  14.0 - 49.7 %   MONO% 6.7  0.0 - 14.0 %   EOS% 1.7  0.0 - 7.0 %   BASO% 1.6  0.0 - 2.0 %    Labs:  Lab Results  Component Value Date   WBC 4.2 01/04/2014   HGB 10.9* 01/04/2014   HCT 34.1*  01/04/2014   MCV 101.6* 01/04/2014   PLT 509* 01/04/2014   NEUTROABS 1.7 01/04/2014      Chemistry      Component Value Date/Time   NA 144 01/04/2014 1004   NA 139 04/21/2013 1027   K 3.7 01/04/2014 1004   K 3.6 04/21/2013 1027   CL 100 04/21/2013 1027   CL 106 10/06/2012 1002   CO2 28 01/04/2014 1004   CO2 27 04/21/2013 1027   BUN 10.1 01/04/2014 1004   BUN 14 04/21/2013 1027   CREATININE 1.3* 01/04/2014 1004   CREATININE 1.12* 04/21/2013 1027      Component Value Date/Time   CALCIUM 9.6 01/04/2014 1004   CALCIUM 9.4 04/21/2013 1027   ALKPHOS 77 01/04/2014 1004   ALKPHOS 83 04/21/2013  1027   AST 18 01/04/2014 1004   AST 22 04/21/2013 1027   ALT 16 01/04/2014 1004   ALT 18 04/21/2013 1027   BILITOT 0.38 01/04/2014 1004   BILITOT 0.3 04/21/2013 1027     Basic Metabolic Panel:  Recent Labs Lab 01/04/14 1004  NA 144  K 3.7  CO2 28  GLUCOSE 112  BUN 10.1  CREATININE 1.3*  CALCIUM 9.6   GFR Estimated Creatinine Clearance: 62.9 ml/min (by C-G formula based on Cr of 1.3). Liver Function Tests:  Recent Labs Lab 01/04/14 1004  AST 18  ALT 16  ALKPHOS 77  BILITOT 0.38  PROT 7.1  ALBUMIN 4.0   CBC:  Recent Labs Lab 01/04/14 1004  WBC 4.2  NEUTROABS 1.7  HGB 10.9*  HCT 34.1*  MCV 101.6*  PLT 509*   RADIOGRAPHIC STUDIES: 1. MRI of the brain with and without IV contrast from 09/10/2011 showed no evidence for pituitary microadenoma. It was noted that an elevated prolactin level was present.  2. Limited abdominal ultrasound on 11/19/2004 showed no evidence for splenomegaly.  3. Chest x-ray, 2 view, on 06/30/2012 showed no active disease.  ASSESSMENT: [name] 48 y.o. female with a history of Essential thrombocythemia   PLAN:  1. Essential Thrombocythemia. --Kaidance's condition remains stable.  We discussed extensively the importance of compliance.   Her plts are 509 today.  She was started back on anagrelide in 03/26/2012 secondary to platelet count of 715,000.  She  will follow up with repeat labs every other month.  She states that she will try her best to make these appointments as her follow-up will allow for further titration in her medication.      2. Anemia secondary chemotherapy. --She is asymptomatic.    3. Depression/Anxiety. --She is being followed closely with psychiatry.    4. Follow-up.  --We will plan to check CBCs every 2 months and plan to see her again in 6 months, at which time we will check CBC, chemistries and LDH.  All questions were answered. The patient knows to call the clinic with any problems, questions or concerns. We can certainly see the patient much sooner if necessary.  I spent 15 minutes counseling the patient face to face. The total time spent in the appointment was 25 minutes.    Angie Hogg, MD 01/04/2014 11:17 AM

## 2014-01-04 NOTE — Telephone Encounter (Signed)
gv adn rpinted appt sched and avs for pt for July thru [facility]

## 2014-01-05 ENCOUNTER — Other Ambulatory Visit (HOSPITAL_COMMUNITY): Payer: Federal, State, Local not specified - PPO | Admitting: Psychiatry

## 2014-01-06 ENCOUNTER — Other Ambulatory Visit (HOSPITAL_COMMUNITY): Payer: Federal, State, Local not specified - PPO

## 2014-01-08 ENCOUNTER — Other Ambulatory Visit: Payer: Self-pay | Admitting: Internal Medicine

## 2014-01-09 ENCOUNTER — Other Ambulatory Visit (HOSPITAL_COMMUNITY): Payer: Federal, State, Local not specified - PPO

## 2014-01-10 ENCOUNTER — Other Ambulatory Visit (HOSPITAL_COMMUNITY): Payer: Federal, State, Local not specified - PPO

## 2014-01-11 ENCOUNTER — Other Ambulatory Visit (HOSPITAL_COMMUNITY): Payer: Federal, State, Local not specified - PPO

## 2014-01-12 ENCOUNTER — Other Ambulatory Visit (HOSPITAL_COMMUNITY): Payer: Federal, State, Local not specified - PPO

## 2014-01-13 ENCOUNTER — Other Ambulatory Visit (HOSPITAL_COMMUNITY): Payer: Federal, State, Local not specified - PPO

## 2014-01-16 ENCOUNTER — Other Ambulatory Visit (HOSPITAL_COMMUNITY): Payer: Federal, State, Local not specified - PPO

## 2014-01-17 ENCOUNTER — Other Ambulatory Visit (HOSPITAL_COMMUNITY): Payer: Federal, State, Local not specified - PPO

## 2014-01-18 ENCOUNTER — Ambulatory Visit (HOSPITAL_COMMUNITY): Payer: Self-pay | Admitting: Psychiatry

## 2014-01-25 ENCOUNTER — Other Ambulatory Visit (HOSPITAL_COMMUNITY): Payer: Self-pay | Admitting: Psychiatry

## 2014-01-25 DIAGNOSIS — F329 Major depressive disorder, single episode, unspecified: Secondary | ICD-10-CM

## 2014-01-31 ENCOUNTER — Telehealth: Payer: Self-pay

## 2014-01-31 DIAGNOSIS — D473 Essential (hemorrhagic) thrombocythemia: Secondary | ICD-10-CM

## 2014-01-31 NOTE — Telephone Encounter (Signed)
lvm that scheduler made appt every month when dr Juliann Mule said ok for every other month. She does not need to come tomorrow. Give [place] a call if she feels she needs lab check.

## 2014-02-01 ENCOUNTER — Other Ambulatory Visit: Payer: Self-pay

## 2014-02-01 ENCOUNTER — Telehealth: Payer: Self-pay | Admitting: Internal Medicine

## 2014-02-01 NOTE — Telephone Encounter (Signed)
returned pt call and advised that appt are Q47mths and pt aware of all new appts.Marland KitchenMarland KitchenMarland Kitchen

## 2014-02-20 ENCOUNTER — Telehealth: Payer: Self-pay | Admitting: Internal Medicine

## 2014-02-20 NOTE — Telephone Encounter (Signed)
pt called to r/s appt..done...pt aware of new d.t °

## 2014-02-21 ENCOUNTER — Other Ambulatory Visit: Payer: Self-pay | Admitting: Internal Medicine

## 2014-03-01 ENCOUNTER — Other Ambulatory Visit (HOSPITAL_BASED_OUTPATIENT_CLINIC_OR_DEPARTMENT_OTHER): Payer: Federal, State, Local not specified - PPO

## 2014-03-01 ENCOUNTER — Other Ambulatory Visit: Payer: Self-pay

## 2014-03-01 DIAGNOSIS — D473 Essential (hemorrhagic) thrombocythemia: Secondary | ICD-10-CM

## 2014-03-01 LAB — CBC WITH DIFFERENTIAL/PLATELET
BASO%: 1.7 % (ref 0.0–2.0)
BASOS ABS: 0.1 10*3/uL (ref 0.0–0.1)
EOS%: 1.1 % (ref 0.0–7.0)
Eosinophils Absolute: 0 10*3/uL (ref 0.0–0.5)
HCT: 32.9 % — ABNORMAL LOW (ref 34.8–46.6)
HEMOGLOBIN: 10.6 g/dL — AB (ref 11.6–15.9)
LYMPH#: 1.6 10*3/uL (ref 0.9–3.3)
LYMPH%: 45.1 % (ref 14.0–49.7)
MCH: 33.3 pg (ref 25.1–34.0)
MCHC: 32.2 g/dL (ref 31.5–36.0)
MCV: 103.2 fL — ABNORMAL HIGH (ref 79.5–101.0)
MONO#: 0.3 10*3/uL (ref 0.1–0.9)
MONO%: 7.9 % (ref 0.0–14.0)
NEUT#: 1.6 10*3/uL (ref 1.5–6.5)
NEUT%: 44.2 % (ref 38.4–76.8)
Platelets: 399 10*3/uL (ref 145–400)
RBC: 3.18 10*6/uL — ABNORMAL LOW (ref 3.70–5.45)
RDW: 18.5 % — AB (ref 11.2–14.5)
WBC: 3.5 10*3/uL — AB (ref 3.9–10.3)

## 2014-03-07 ENCOUNTER — Telehealth (HOSPITAL_COMMUNITY): Payer: Self-pay

## 2014-03-16 ENCOUNTER — Ambulatory Visit (HOSPITAL_COMMUNITY): Payer: Self-pay | Admitting: Psychiatry

## 2014-03-19 ENCOUNTER — Other Ambulatory Visit (HOSPITAL_COMMUNITY): Payer: Self-pay | Admitting: Psychiatry

## 2014-03-19 DIAGNOSIS — F329 Major depressive disorder, single episode, unspecified: Secondary | ICD-10-CM

## 2014-03-26 ENCOUNTER — Other Ambulatory Visit: Payer: Self-pay | Admitting: Internal Medicine

## 2014-03-27 ENCOUNTER — Telehealth: Payer: Self-pay | Admitting: *Deleted

## 2014-03-27 MED ORDER — HYDROXYUREA 500 MG PO CAPS: ORAL_CAPSULE | ORAL | Status: DC

## 2014-03-27 NOTE — Telephone Encounter (Signed)
Pt called with vm asking to refill her hydrea stating that she has had delays in getting this filled every time & doesn't understand why.  Message left that we would refill until her next lab appt.

## 2014-03-29 ENCOUNTER — Other Ambulatory Visit: Payer: Self-pay

## 2014-04-12 ENCOUNTER — Telehealth (HOSPITAL_COMMUNITY): Payer: Self-pay | Admitting: *Deleted

## 2014-04-12 NOTE — Telephone Encounter (Signed)
Patient left EN:[name] said her refills were denied and she wants to know why. Phoned patient and left message: Not sure why refills were denied.Has not had appt since May and has none scheduled.Advised her to contact [facility] to resend refill requests and to call office and make appt with Dr. Adele Schilder

## 2014-04-22 ENCOUNTER — Other Ambulatory Visit (HOSPITAL_COMMUNITY): Payer: Self-pay | Admitting: Psychiatry

## 2014-05-02 ENCOUNTER — Telehealth: Payer: Self-pay | Admitting: Internal Medicine

## 2014-05-02 NOTE — Telephone Encounter (Signed)
returned pt call and lvm for pt with r/s d.t per pt request

## 2014-05-03 ENCOUNTER — Other Ambulatory Visit: Payer: Self-pay

## 2014-05-03 ENCOUNTER — Other Ambulatory Visit (HOSPITAL_BASED_OUTPATIENT_CLINIC_OR_DEPARTMENT_OTHER): Payer: Federal, State, Local not specified - PPO

## 2014-05-03 DIAGNOSIS — D473 Essential (hemorrhagic) thrombocythemia: Secondary | ICD-10-CM

## 2014-05-03 LAB — CBC WITH DIFFERENTIAL/PLATELET
BASO%: 0.7 % (ref 0.0–2.0)
BASOS ABS: 0 10*3/uL (ref 0.0–0.1)
EOS%: 0.5 % (ref 0.0–7.0)
Eosinophils Absolute: 0 10*3/uL (ref 0.0–0.5)
HEMATOCRIT: 34.6 % — AB (ref 34.8–46.6)
HEMOGLOBIN: 11.4 g/dL — AB (ref 11.6–15.9)
LYMPH%: 45.3 % (ref 14.0–49.7)
MCH: 34.8 pg — ABNORMAL HIGH (ref 25.1–34.0)
MCHC: 32.9 g/dL (ref 31.5–36.0)
MCV: 105.5 fL — ABNORMAL HIGH (ref 79.5–101.0)
MONO#: 0.4 10*3/uL (ref 0.1–0.9)
MONO%: 10.8 % (ref 0.0–14.0)
NEUT#: 1.7 10*3/uL (ref 1.5–6.5)
NEUT%: 42.7 % (ref 38.4–76.8)
Platelets: 557 10*3/uL — ABNORMAL HIGH (ref 145–400)
RBC: 3.28 10*6/uL — ABNORMAL LOW (ref 3.70–5.45)
RDW: 16.8 % — ABNORMAL HIGH (ref 11.2–14.5)
WBC: 4.1 10*3/uL (ref 3.9–10.3)
lymph#: 1.9 10*3/uL (ref 0.9–3.3)
nRBC: 1 % — ABNORMAL HIGH (ref 0–0)

## 2014-05-04 ENCOUNTER — Ambulatory Visit (HOSPITAL_COMMUNITY): Payer: Self-pay | Admitting: Psychiatry

## 2014-05-04 ENCOUNTER — Other Ambulatory Visit (HOSPITAL_COMMUNITY): Payer: Self-pay | Admitting: Psychiatry

## 2014-05-05 NOTE — Telephone Encounter (Signed)
Refills not appropriate. Last appt 5/12, no future appointments. Needs to make an appointment, was flagged on last prescription given.

## 2014-05-09 ENCOUNTER — Telehealth (HOSPITAL_COMMUNITY): Payer: Self-pay

## 2014-05-09 ENCOUNTER — Other Ambulatory Visit (HOSPITAL_COMMUNITY): Payer: Self-pay | Admitting: Psychiatry

## 2014-05-19 ENCOUNTER — Encounter (HOSPITAL_COMMUNITY): Payer: Self-pay | Admitting: Psychiatry

## 2014-05-19 ENCOUNTER — Ambulatory Visit (INDEPENDENT_AMBULATORY_CARE_PROVIDER_SITE_OTHER): Payer: Federal, State, Local not specified - PPO | Admitting: Psychiatry

## 2014-05-19 VITALS — BP 118/66 | HR 65 | Ht 70.0 in | Wt 199.6 lb

## 2014-05-19 DIAGNOSIS — F4310 Post-traumatic stress disorder, unspecified: Secondary | ICD-10-CM

## 2014-05-19 DIAGNOSIS — F339 Major depressive disorder, recurrent, unspecified: Secondary | ICD-10-CM

## 2014-05-19 DIAGNOSIS — F1010 Alcohol abuse, uncomplicated: Secondary | ICD-10-CM

## 2014-05-19 DIAGNOSIS — F1210 Cannabis abuse, uncomplicated: Secondary | ICD-10-CM

## 2014-05-19 DIAGNOSIS — F331 Major depressive disorder, recurrent, moderate: Secondary | ICD-10-CM

## 2014-05-19 MED ORDER — CLONAZEPAM 0.5 MG PO TABS: 0.5000 mg | ORAL_TABLET | Freq: Every day | ORAL | Status: DC

## 2014-05-19 MED ORDER — LAMOTRIGINE 200 MG PO TABS: ORAL_TABLET | ORAL | Status: DC

## 2014-05-19 MED ORDER — MIRTAZAPINE 30 MG PO TABS: 30.0000 mg | ORAL_TABLET | Freq: Every day | ORAL | Status: DC
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
## 2014-05-31 ENCOUNTER — Other Ambulatory Visit: Payer: Self-pay

## 2014-06-09 ENCOUNTER — Telehealth: Payer: Self-pay | Admitting: Hematology

## 2014-06-09 NOTE — Telephone Encounter (Signed)
Return call regarding VM pt left needing to r/s appt from 12/03 to 12/07. Pt confirm new appt d/t.

## 2014-06-18 ENCOUNTER — Other Ambulatory Visit: Payer: Self-pay | Admitting: Hematology

## 2014-06-21 ENCOUNTER — Other Ambulatory Visit: Payer: Self-pay | Admitting: *Deleted

## 2014-06-21 DIAGNOSIS — D473 Essential (hemorrhagic) thrombocythemia: Secondary | ICD-10-CM

## 2014-06-21 MED ORDER — HYDROXYUREA 500 MG PO CAPS: ORAL_CAPSULE | ORAL | Status: DC

## 2014-06-26 ENCOUNTER — Telehealth: Payer: Self-pay | Admitting: Hematology and Oncology

## 2014-06-26 NOTE — Telephone Encounter (Signed)
moved CP1 12/7 to NG 12/17. lmonvm for pt and mailed schedule.

## 2014-06-28 ENCOUNTER — Telehealth: Payer: Self-pay | Admitting: *Deleted

## 2014-06-28 ENCOUNTER — Other Ambulatory Visit: Payer: Self-pay

## 2014-06-28 ENCOUNTER — Ambulatory Visit: Payer: Self-pay

## 2014-06-28 NOTE — Telephone Encounter (Signed)
12/7 at 11 am withh labs prior

## 2014-06-28 NOTE — Telephone Encounter (Signed)
Former pt of Chism and Lona Kettle was scheduled to see Lona Kettle on 12/7 so she requested off work on 12/7.  Now we have changed her appt to see Dr. Alvy Bimler to 12/17.  Pt states she cannot get off work on 12/17 she has already requested off for her appt on 12/7.   Called pt back and left her VM asking if any other days available off work to come in for appt?  Asked her to please call back.  Dr. Alvy Bimler schedule full on 12/7 , not sure if she would be able to see her that day.

## 2014-06-29 ENCOUNTER — Telehealth: Payer: Self-pay | Admitting: Hematology and Oncology

## 2014-06-29 ENCOUNTER — Telehealth: Payer: Self-pay | Admitting: *Deleted

## 2014-06-29 NOTE — Telephone Encounter (Signed)
Pt left VM to confirm appts on Monday 12/7 at 10:30 am.

## 2014-06-29 NOTE — Telephone Encounter (Signed)
Left VM for pt of appt 12/7 10:30 am for lab and 11 am for Dr. Alvy Bimler.  Asked her to please call nurse back to confirm.

## 2014-06-29 NOTE — Telephone Encounter (Signed)
added appt per pof....per pof pt aware °

## 2014-06-30 ENCOUNTER — Other Ambulatory Visit: Payer: Self-pay | Admitting: Hematology and Oncology

## 2014-06-30 DIAGNOSIS — D473 Essential (hemorrhagic) thrombocythemia: Secondary | ICD-10-CM

## 2014-07-03 ENCOUNTER — Ambulatory Visit (INDEPENDENT_AMBULATORY_CARE_PROVIDER_SITE_OTHER): Payer: Federal, State, Local not specified - PPO | Admitting: Psychiatry

## 2014-07-03 ENCOUNTER — Other Ambulatory Visit: Payer: Self-pay

## 2014-07-03 ENCOUNTER — Ambulatory Visit: Payer: Self-pay

## 2014-07-03 ENCOUNTER — Ambulatory Visit: Payer: Self-pay | Admitting: Hematology and Oncology

## 2014-07-03 DIAGNOSIS — F331 Major depressive disorder, recurrent, moderate: Secondary | ICD-10-CM

## 2014-07-04 NOTE — Progress Notes (Signed)
   THERAPIST PROGRESS NOTE  Session Time: 2:00-3:00  Participation Level: Active  Behavioral Response: CasualAlertDepressed  Type of Therapy: Individual Therapy  Treatment Goals addressed: Anxiety and Coping and Depression  Interventions: CBT, Strength-based and Supportive  Summary: [name] is a 48 y.o. female who presents with depression and anxiety.   Suicidal/Homicidal: Nowithout intent/plan  Therapist Response: Pt. Reports that her anxiety has been high in recent weeks. Pt. 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 Pt. Reports that work continues to be stressful because she has not adjusted to new environment. Pt. Continues to report her three children as significant stressors due to alienation from one daughter and grandchildren, alcohol dependence with a second daughter, and youngest daughter who lives in [place]. Pt. Reports that she is feeling more socially isolated and having symptoms of agoraphobia with significant social anxiety and difficulty reaching out to friends and going to the grocery store. Pt. Reports recent interest in coloring which calms her. Pt. Reports that she has needed more therapy and support than my schedule will provide and agreed to transfer to a new therapist for ongoing care.  Plan: Pt. To transfer to new therapist in office due to scheduling.  Diagnosis: Axis I: Depressive Disorder NOS    Axis II: No diagnosis    Nancie Neas, [facility] 07/04/2014

## 2014-07-13 ENCOUNTER — Ambulatory Visit: Payer: Self-pay | Admitting: Hematology and Oncology

## 2014-07-13 ENCOUNTER — Other Ambulatory Visit: Payer: Self-pay

## 2014-07-31 ENCOUNTER — Ambulatory Visit (HOSPITAL_COMMUNITY): Payer: Self-pay | Admitting: Clinical

## 2014-08-02 ENCOUNTER — Ambulatory Visit (HOSPITAL_COMMUNITY): Payer: Self-pay | Admitting: Psychiatry

## 2014-08-03 ENCOUNTER — Other Ambulatory Visit (HOSPITAL_COMMUNITY): Payer: Self-pay | Admitting: Psychiatry

## 2014-08-07 ENCOUNTER — Other Ambulatory Visit (HOSPITAL_COMMUNITY): Payer: Self-pay | Admitting: Psychiatry

## 2014-08-07 NOTE — Telephone Encounter (Signed)
Should keep appointment on 08/18/14 for refill on benzodiazapine.

## 2014-08-08 ENCOUNTER — Telehealth (HOSPITAL_COMMUNITY): Payer: Self-pay

## 2014-08-08 ENCOUNTER — Encounter (HOSPITAL_COMMUNITY): Payer: Self-pay | Admitting: Clinical

## 2014-08-08 ENCOUNTER — Ambulatory Visit (INDEPENDENT_AMBULATORY_CARE_PROVIDER_SITE_OTHER): Payer: Federal, State, Local not specified - PPO | Admitting: Clinical

## 2014-08-08 DIAGNOSIS — F411 Generalized anxiety disorder: Secondary | ICD-10-CM

## 2014-08-08 DIAGNOSIS — F331 Major depressive disorder, recurrent, moderate: Secondary | ICD-10-CM | POA: Diagnosis not present
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
## 2014-08-08 NOTE — Telephone Encounter (Signed)
Dr. [name] patient has an appointment with you on Friday 08/11/14 but is requesting medications refills of Klonopin and Lamictal.  She got her Remeron reordered yesterday.  Could you rewrite these and print if she needs prior and escribe the Lamictal?

## 2014-08-11 ENCOUNTER — Ambulatory Visit (INDEPENDENT_AMBULATORY_CARE_PROVIDER_SITE_OTHER): Payer: Federal, State, Local not specified - PPO | Admitting: Psychiatry

## 2014-08-11 ENCOUNTER — Encounter (HOSPITAL_COMMUNITY): Payer: Self-pay | Admitting: Psychiatry

## 2014-08-11 VITALS — BP 127/75 | HR 88 | Ht 70.0 in | Wt 201.8 lb

## 2014-08-11 DIAGNOSIS — F331 Major depressive disorder, recurrent, moderate: Secondary | ICD-10-CM

## 2014-08-11 DIAGNOSIS — F1010 Alcohol abuse, uncomplicated: Secondary | ICD-10-CM

## 2014-08-11 DIAGNOSIS — F1210 Cannabis abuse, uncomplicated: Secondary | ICD-10-CM

## 2014-08-11 DIAGNOSIS — F339 Major depressive disorder, recurrent, unspecified: Secondary | ICD-10-CM

## 2014-08-11 DIAGNOSIS — F4310 Post-traumatic stress disorder, unspecified: Secondary | ICD-10-CM

## 2014-08-11 MED ORDER — CLONAZEPAM 0.5 MG PO TABS: 0.5000 mg | ORAL_TABLET | Freq: Every day | ORAL | Status: DC

## 2014-08-11 MED ORDER — MIRTAZAPINE 30 MG PO TABS: 30.0000 mg | ORAL_TABLET | Freq: Every day | ORAL | Status: DC

## 2014-08-11 MED ORDER — LAMOTRIGINE 200 MG PO TABS: 200.0000 mg | ORAL_TABLET | Freq: Every day | ORAL | Status: DC
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
## 2014-08-16 ENCOUNTER — Ambulatory Visit (HOSPITAL_COMMUNITY): Payer: Self-pay | Admitting: Clinical

## 2014-08-18 ENCOUNTER — Ambulatory Visit (HOSPITAL_COMMUNITY): Payer: Self-pay | Admitting: Psychiatry

## 2014-08-25 ENCOUNTER — Ambulatory Visit (INDEPENDENT_AMBULATORY_CARE_PROVIDER_SITE_OTHER): Payer: Federal, State, Local not specified - PPO | Admitting: Clinical

## 2014-08-25 ENCOUNTER — Encounter (HOSPITAL_COMMUNITY): Payer: Self-pay | Admitting: Clinical

## 2014-08-25 DIAGNOSIS — F411 Generalized anxiety disorder: Secondary | ICD-10-CM | POA: Diagnosis not present

## 2014-08-25 DIAGNOSIS — F331 Major depressive disorder, recurrent, moderate: Secondary | ICD-10-CM

## 2014-08-25 NOTE — Progress Notes (Signed)
   THERAPIST PROGRESS NOTE  Session Time: 10:00- 10:56  Participation Level: Active  Behavioral Response: Casual and NeatAlertDepressed  Type of Therapy: Individual Therapy  Treatment Goals addressed: Calming techniques, Depression  Interventions: CBT, Motivational Interviewing and Other: Grounding and Mindfulness  Summary: [name] is a 49 y.o. female who presents with major depressive disorder.   Suicidal/Homicidal: Nowithout intent/plan  Therapist Response: [name] met with clinician for an individual session. She discussed her psychiatric symptoms and her current life events. Ms. [name] shared that her depression was bad enough for her to miss 2 days of work. [name] and clinician discussed her depression and [name] shared about her thoughts and emotions. Clinician shared about how our thoughts inform our emotions. [name] and clinician discussed and reviewed grounding and mindfulness techniques. [name] and clinician practiced some of them together. [name] asked clarifying qquestions. [name] agreed to practice the grounding techniques until next session. [name] also agreed to keep a gratitude journal daily until next session. [name] and clinician discussed the purpose of the gratitude journal   Plan: Return again in 1 week.  Diagnosis: Axis I: major depressive disorder and generalized anxiety disorder      Reece Fehnel A, LCSW 08/25/2014

## 2014-09-08 ENCOUNTER — Ambulatory Visit (HOSPITAL_COMMUNITY): Payer: Self-pay | Admitting: Clinical

## 2014-09-14 ENCOUNTER — Telehealth: Payer: Self-pay | Admitting: Hematology and Oncology

## 2014-09-14 NOTE — Telephone Encounter (Signed)
pt called to r/s cx appt....done....pt ok and aware of new d.t

## 2014-09-15 ENCOUNTER — Ambulatory Visit (INDEPENDENT_AMBULATORY_CARE_PROVIDER_SITE_OTHER): Payer: Federal, State, Local not specified - PPO | Admitting: Clinical

## 2014-09-15 ENCOUNTER — Encounter (HOSPITAL_COMMUNITY): Payer: Self-pay | Admitting: Clinical

## 2014-09-15 DIAGNOSIS — F331 Major depressive disorder, recurrent, moderate: Secondary | ICD-10-CM | POA: Diagnosis not present

## 2014-09-15 DIAGNOSIS — F411 Generalized anxiety disorder: Secondary | ICD-10-CM

## 2014-09-15 NOTE — Progress Notes (Signed)
   THERAPIST PROGRESS NOTE  Session Time: 11:03-  Participation Level: Active  Behavioral Response: Casual and NeatAlertDepressed  Type of Therapy: Individual Therapy  Treatment Goals addressed: psychiatric symptoms  Interventions: CBT, Motivational Interviewing and Other: Grounding and Mindfulness Skills  Summary: [name] is a 49 y.o. female who presents with Major Dperessive Disorder  And Generalized Anxiety  Suicidal/Homicidal: Nowithout intent/plan  Therapist Response:  Kylei met with clinician for an individual session. Marenda discussed her psychiatric symptoms and some of her life stressors. Fontaine shared about her experience with anxiety and depression over the past week. She shared that she had missed some work because of an illness. She shared that she had trouble keeping her gratitude journal. Client and clinician discussed the process and the goal of the journal. Clinician asked open ended questions and Tu was able to identify positive events in her days. Lanelle Bal asked some clarifying questions and clinician answered. Naidelyn shared some about one of her stressors - being separated from her grandson. Phallon shared about her daughter and their relationship. She shared about the grandchildren and especially about one who has lived with her on and off for the whole of his life. Josceline shared her conflicting thoughts and emotions. Lilleigh shared how she copes with the separation and her concern for him. Client and clinician practiced some grounding techniques together. Ludia reported that she felt better sharing and then also being able to ground . Client and clinician discussed the purpose of the grounding techniques and how she could use them for when she is emotionally overwhelmed   Plan: Return again in 1-2 weeks.  Diagnosis: Axis I: Major Dperessive Disorder  And Generalized Anxiety     Aika Brzoska A, LCSW 09/15/2014

## 2014-10-03 ENCOUNTER — Ambulatory Visit: Payer: Self-pay | Admitting: Hematology and Oncology

## 2014-10-03 ENCOUNTER — Telehealth: Payer: Self-pay | Admitting: Hematology and Oncology

## 2014-10-03 ENCOUNTER — Other Ambulatory Visit: Payer: Self-pay

## 2014-10-03 NOTE — Telephone Encounter (Signed)
pt called to r/s appt no explanation....pt aware of new d.t

## 2014-10-05 ENCOUNTER — Ambulatory Visit (HOSPITAL_COMMUNITY): Payer: Self-pay | Admitting: Clinical

## 2014-10-06 ENCOUNTER — Ambulatory Visit (INDEPENDENT_AMBULATORY_CARE_PROVIDER_SITE_OTHER): Payer: Federal, State, Local not specified - PPO | Admitting: Clinical

## 2014-10-06 DIAGNOSIS — F411 Generalized anxiety disorder: Secondary | ICD-10-CM

## 2014-10-06 DIAGNOSIS — F331 Major depressive disorder, recurrent, moderate: Secondary | ICD-10-CM | POA: Diagnosis not present

## 2014-10-09 ENCOUNTER — Encounter (HOSPITAL_COMMUNITY): Payer: Self-pay | Admitting: Clinical

## 2014-10-09 NOTE — Progress Notes (Signed)
   THERAPIST PROGRESS NOTE  Session Time: 11:10 -12:08  Participation Level: Active  Behavioral Response: Casual and NeatAlertAnxious  Type of Therapy: Individual Therapy  Treatment Goals addressed:   improve psychiatric symptoms, grounding and mindfulness skills, interpersonal relationship skills  Interventions: CBT and Motivational Interviewing  Summary: RONELLA PLUNK is a 49 y.o. female who presents with Major Depressive Disorder, and Generalized Anxiety Disorder   Suicidal/Homicidal: Nowithout intent/plan  Therapist Response:  Kelsie met with clinician for an individual session. Rosali discussed her psychiatric symptoms, her current life events, and her homework. Alexsandria shared that she was experiencing a lot of anxiety this week. She shared that she was going to [place] to meet her daughter. She shared that she was having anxiety about the trip, about leaving her dog, and about the unknown. Client and clinician addressed each of her concerns individually. Tarahji was able to identify and challenge her negative automatic thoughts about each of her concerns. She had the insight that the process of dealing with each thought individually  helped reduce her anxiety. Orpha then was able to share her gratitude that her daughter wanted to spend time with her. Client and clinician discussed how being mindful of the moment would improve her experience. Makenleigh shared her thoughts and insights from her homework and agreed to continue her homework until next session. Plan: Return again in 1-2 weeks.  Diagnosis: Axis I: Major Depressive Disorder, and Generalized Anxiety Disorder        Briyah Wheelwright A, LCSW 10/09/2014

## 2014-10-13 ENCOUNTER — Ambulatory Visit (HOSPITAL_COMMUNITY): Payer: Self-pay | Admitting: Clinical

## 2014-10-23 ENCOUNTER — Other Ambulatory Visit: Payer: Self-pay | Admitting: Internal Medicine

## 2014-10-25 ENCOUNTER — Encounter (HOSPITAL_COMMUNITY): Payer: Self-pay | Admitting: Clinical

## 2014-10-25 ENCOUNTER — Ambulatory Visit (INDEPENDENT_AMBULATORY_CARE_PROVIDER_SITE_OTHER): Payer: Federal, State, Local not specified - PPO | Admitting: Clinical

## 2014-10-25 DIAGNOSIS — F331 Major depressive disorder, recurrent, moderate: Secondary | ICD-10-CM

## 2014-10-25 DIAGNOSIS — F411 Generalized anxiety disorder: Secondary | ICD-10-CM | POA: Diagnosis not present

## 2014-10-25 NOTE — Progress Notes (Signed)
   THERAPIST PROGRESS NOTE  Session Time: 12:30 - 1:30  Participation Level: Active  Behavioral Response: Casual and NeatAlertDepressed  Type of Therapy: Individual Therapy  Treatment Goals addressed:   improve psychiatric symptoms,  emotional regulation skills, interpersonal relationship skills  Interventions: CBT and Motivational Interviewing  Summary: [name] is a 49 y.o. female who presents with Major Depressive Disorder, and Generalized Anxiety Disorder   Suicidal/Homicidal: No -without intent/plan  Therapist Response:  [name] met with clinician for an individual session. [name] discussed her psychiatric symptoms, her current life events, and her homework. [name] shared that her trip did not go well. [name] discussed some of the negative things that happened and her challenge of dealing with her daughter who suffers from depression but is (as of yet) unwilling to seek treatment. Clinician asked if [name] followed suit with depressive behaviors. She reported that she had worked hard not to. She reported that  She had done activities and worked to be active. [name] and clinician discussed that this was an achievement. [name] reported that some of her other anxieties such as concerns about her dog were unfounded. [name] and clinician discussed her insights from her adventure and actions. [name] also discussed her relationships with her daughters, what is working and what is causing her difficulty. [name] and clinician discussed healthy boundaries. [name] to continue her cbt and grounding and mindfulness homework   Plan: Return again in 1-2 weeks.  Diagnosis:     Axis I: Major Depressive Disorder, and Generalized Anxiety Disorder     [name] A, LCSW 10/25/2014

## 2014-11-02 ENCOUNTER — Telehealth: Payer: Self-pay | Admitting: Hematology and Oncology

## 2014-11-02 ENCOUNTER — Other Ambulatory Visit (HOSPITAL_BASED_OUTPATIENT_CLINIC_OR_DEPARTMENT_OTHER): Payer: Federal, State, Local not specified - PPO

## 2014-11-02 ENCOUNTER — Encounter: Payer: Self-pay | Admitting: Hematology and Oncology

## 2014-11-02 ENCOUNTER — Ambulatory Visit (HOSPITAL_BASED_OUTPATIENT_CLINIC_OR_DEPARTMENT_OTHER): Payer: Federal, State, Local not specified - PPO | Admitting: Hematology and Oncology

## 2014-11-02 VITALS — BP 134/77 | HR 60 | Temp 98.1°F | Resp 18 | Ht 70.0 in | Wt 203.7 lb

## 2014-11-02 DIAGNOSIS — D473 Essential (hemorrhagic) thrombocythemia: Secondary | ICD-10-CM

## 2014-11-02 DIAGNOSIS — D630 Anemia in neoplastic disease: Secondary | ICD-10-CM | POA: Diagnosis not present

## 2014-11-02 DIAGNOSIS — F411 Generalized anxiety disorder: Secondary | ICD-10-CM | POA: Diagnosis not present

## 2014-11-02 LAB — COMPREHENSIVE METABOLIC PANEL (CC13)
ALT: 22 U/L (ref 0–55)
AST: 23 U/L (ref 5–34)
Albumin: 4 g/dL (ref 3.5–5.0)
Alkaline Phosphatase: 118 U/L (ref 40–150)
Anion Gap: 9 mEq/L (ref 3–11)
BUN: 9.8 mg/dL (ref 7.0–26.0)
CO2: 24 mEq/L (ref 22–29)
CREATININE: 0.9 mg/dL (ref 0.6–1.1)
Calcium: 9.2 mg/dL (ref 8.4–10.4)
Chloride: 108 mEq/L (ref 98–109)
EGFR: 82 mL/min/{1.73_m2} — ABNORMAL LOW (ref >90)
Glucose: 99 mg/dl (ref 70–140)
POTASSIUM: 3.8 meq/L (ref 3.5–5.1)
Sodium: 141 mEq/L (ref 136–145)
Total Bilirubin: 0.34 mg/dL (ref 0.20–1.20)
Total Protein: 7.1 g/dL (ref 6.4–8.3)

## 2014-11-02 LAB — CBC WITH DIFFERENTIAL/PLATELET
BASO%: 1.8 % (ref 0.0–2.0)
BASOS ABS: 0.1 10*3/uL (ref 0.0–0.1)
EOS%: 0.7 % (ref 0.0–7.0)
Eosinophils Absolute: 0 10*3/uL (ref 0.0–0.5)
HCT: 34.7 % — ABNORMAL LOW (ref 34.8–46.6)
HEMOGLOBIN: 11.1 g/dL — AB (ref 11.6–15.9)
LYMPH#: 2.3 10*3/uL (ref 0.9–3.3)
LYMPH%: 37.3 % (ref 14.0–49.7)
MCH: 30.3 pg (ref 25.1–34.0)
MCHC: 32 g/dL (ref 31.5–36.0)
MCV: 94.8 fL (ref 79.5–101.0)
MONO#: 0.6 10*3/uL (ref 0.1–0.9)
MONO%: 9.6 % (ref 0.0–14.0)
NEUT%: 50.6 % (ref 38.4–76.8)
NEUTROS ABS: 3.1 10*3/uL (ref 1.5–6.5)
Platelets: 535 10*3/uL — ABNORMAL HIGH (ref 145–400)
RBC: 3.66 10*6/uL — ABNORMAL LOW (ref 3.70–5.45)
RDW: 20 % — ABNORMAL HIGH (ref 11.2–14.5)
WBC: 6 10*3/uL (ref 3.9–10.3)
nRBC: 6 % — ABNORMAL HIGH (ref 0–0)

## 2014-11-02 LAB — TECHNOLOGIST REVIEW

## 2014-11-02 NOTE — Assessment & Plan Note (Signed)
She was treated with combination of hydroxyurea and anagrelide in the past but had been off treatment for 6 months due to major depression. To simplify her treatment, I recommend we just restarted back on anagrelide only. I am concerned with anemia and prior leukopenia that I do not take she will tolerate hydroxyurea well. I will see her back and reassess in 3 months. In the meantime, she will continue 81 mg aspirin to prevent risk of blood clot.

## 2014-11-02 NOTE — Telephone Encounter (Signed)
gave and printed appt sched and avs fo rpt for July  °

## 2014-11-02 NOTE — Progress Notes (Signed)
[facility] FOLLOW-UP progress notes  Patient Care Team: Darcus Austin, MD as PCP - General (Family Medicine)  CHIEF COMPLAINTS/PURPOSE OF VISIT:  Essential thrombocytosis  HISTORY OF PRESENTING ILLNESS:  [name] 49 y.o. female was transferred to my care after her prior physician has left.  I reviewed the patient's records extensive and collaborated the history with the patient. Summary of her history is as follows: This patient was originally diagnosed with myeloproliferative disorder approximately 10 years ago. According to her, she have chronic back pain. MRI showed abnormal bone marrow signal and she was subsequently referred to see an oncologist. Bone marrow biopsy dated 12/10/2004, confirm essential thrombocytosis. The patient was started on Hydroxyurea 1000 mg daily for 5 days and 1500 mg on Tuesdays and Fridays and anagrelide 0.5 mg twice daily. She has poor compliance which she attributed to major depression. She self discontinued medication for over 6 months to remain on aspirin to prevent risk of blood clots. She has frequent sweats which she attributed to menopausal symptoms. The patient denies any recent signs or symptoms of bleeding such as spontaneous epistaxis, hematuria or hematochezia.   MEDICAL HISTORY:  Past Medical History  Diagnosis Date  . Essential thrombocythemia   . Depression   . Anxiety   . PTSD (post-traumatic stress disorder)     SURGICAL HISTORY: History reviewed. No pertinent past surgical history.  SOCIAL HISTORY: History   Social History  . Marital Status: Single    Spouse Name: N/A  . Number of Children: N/A  . Years of Education: N/A   Occupational History  . Not on file.   Social History Main Topics  . Smoking status: Never Smoker   . Smokeless tobacco: Never Used  . Alcohol Use: No  . Drug Use: No  . Sexual Activity: No   Other Topics Concern  . Not on file   Social History Narrative    FAMILY  HISTORY: Family History  Problem Relation Age of Onset  . Depression Mother   . Alcohol abuse Father   . Schizophrenia Father   . Depression Brother   . Anxiety disorder Brother   . Post-traumatic stress disorder Brother   . Anxiety disorder Brother   . Anxiety disorder Sister     ALLERGIES:  has No Known Allergies.  MEDICATIONS:  Current Outpatient Prescriptions  Medication Sig Dispense Refill  . aspirin 81 MG tablet Take 1 tablet (81 mg total) by mouth daily. 30 tablet 0  . clonazePAM (KLONOPIN) 0.5 MG tablet Take 1 tablet (0.5 mg total) by mouth daily. 30 tablet 2  . ibuprofen (ADVIL,MOTRIN) 400 MG tablet Take 400 mg by mouth every 6 (six) hours as needed.    . lamoTRIgine (LAMICTAL) 200 MG tablet Take 1 tablet (200 mg total) by mouth daily. 30 tablet 2  . losartan-hydrochlorothiazide (HYZAAR) 50-12.5 MG per tablet Take 1 tablet by mouth daily. For hypertension.    . metoprolol tartrate (LOPRESSOR) 25 MG tablet Take 25 mg by mouth 2 (two) times daily.     . mirtazapine (REMERON) 30 MG tablet Take 1 tablet (30 mg total) by mouth at bedtime. 30 tablet 2  . Multiple Vitamin (MULTIVITAMIN) tablet Take 1 tablet by mouth daily.    . SUMAtriptan (IMITREX) 50 MG tablet     . anagrelide (AGRYLIN) 0.5 MG capsule TAKE ONE CAPSULE BY MOUTH THREE TIMES DAILY FOR THROMBOCYTHEMIA (Patient not taking: Reported on 11/02/2014) 90 capsule 0  . hydroxyurea (HYDREA) 500 MG capsule TAKE 2  CAPSULES BY MOUTH EVERY DAY EXCEPT ON TUESDAYS AND THURSDAYS TAKE 3 CAPSULES (Patient not taking: Reported on 11/02/2014) 68 capsule 1   No current facility-administered medications for this visit.    REVIEW OF SYSTEMS:   Constitutional: Denies fevers, chills  Eyes: Denies blurriness of vision, double vision or watery eyes Ears, nose, mouth, throat, and face: Denies mucositis or sore throat Respiratory: Denies cough, dyspnea or wheezes Cardiovascular: Denies palpitation, chest discomfort or lower extremity  swelling Gastrointestinal:  Denies nausea, heartburn or change in bowel habits Skin: Denies abnormal skin rashes Lymphatics: Denies new lymphadenopathy or easy bruising Neurological:Denies numbness, tingling or new weaknesses Behavioral/Psych: Mood is stable, no new changes  All other systems were reviewed with the patient and are negative.  PHYSICAL EXAMINATION: ECOG PERFORMANCE STATUS: 1 - Symptomatic but completely ambulatory  Filed Vitals:   11/02/14 1319  BP: 134/77  Pulse: 60  Temp: 98.1 F (36.7 C)  Resp: 18   Filed Weights   11/02/14 1319  Weight: 203 lb 11.2 oz (92.398 kg)    GENERAL:alert, no distress and comfortable. She is moderately obese SKIN: skin color, texture, turgor are normal, no rashes or significant lesions EYES: normal, conjunctiva are pink and non-injected, sclera clear OROPHARYNX:no exudate, normal lips, buccal mucosa, and tongue  NECK: supple, thyroid normal size, non-tender, without nodularity LYMPH:  no palpable lymphadenopathy in the cervical, axillary or inguinal LUNGS: clear to auscultation and percussion with normal breathing effort HEART: regular rate & rhythm and no murmurs without lower extremity edema ABDOMEN:abdomen soft, non-tender and normal bowel sounds. No splenomegaly Musculoskeletal:no cyanosis of digits and no clubbing  PSYCH: alert & oriented x 3 with fluent speech NEURO: no focal motor/sensory deficits  LABORATORY DATA:  I have reviewed the data as listed Lab Results  Component Value Date   WBC 6.0 11/02/2014   HGB 11.1* 11/02/2014   HCT 34.7* 11/02/2014   MCV 94.8 11/02/2014   PLT 535* 11/02/2014    Recent Labs  01/04/14 1004 11/02/14 1308  NA 144 141  K 3.7 3.8  CO2 28 24  GLUCOSE 112 99  BUN 10.1 9.8  CREATININE 1.3* 0.9  CALCIUM 9.6 9.2  PROT 7.1 7.1  ALBUMIN 4.0 4.0  AST 18 23  ALT 16 22  ALKPHOS 77 118  BILITOT 0.38 0.34   ASSESSMENT & PLAN:  Essential thrombocythemia She was treated with  combination of hydroxyurea and anagrelide in the past but had been off treatment for 6 months due to major depression. To simplify her treatment, I recommend we just restarted back on anagrelide only. I am concerned with anemia and prior leukopenia that I do not take she will tolerate hydroxyurea well. I will see her back and reassess in 3 months. In the meantime, she will continue 81 mg aspirin to prevent risk of blood clot.   Anemia in neoplastic disease This is likely anemia of chronic disease. The patient denies recent history of bleeding such as epistaxis, hematuria or hematochezia. She is asymptomatic from the anemia. We will observe for now.  She does not require transfusion now.     GAD (generalized anxiety disorder) She denies major depression or suicidal ideation right now. She will continue close follow-up at behavioral [facility].    Orders Placed This Encounter  Procedures  . CBC with Differential/Platelet    Standing Status: Standing     Number of Occurrences: 33     Standing Expiration Date: 11/02/2015    All questions were answered. The patient  knows to call the clinic with any problems, questions or concerns. I spent 25 minutes counseling the patient face to face. The total time spent in the appointment was 30 minutes and more than 50% was on counseling.     [place], [place], MD 11/02/2014 1:51 PM

## 2014-11-02 NOTE — Assessment & Plan Note (Signed)
She denies major depression or suicidal ideation right now. She will continue close follow-up at behavioral [facility].

## 2014-11-02 NOTE — Assessment & Plan Note (Signed)
This is likely anemia of chronic disease. The patient denies recent history of bleeding such as epistaxis, hematuria or hematochezia. She is asymptomatic from the anemia. We will observe for now.  She does not require transfusion now.   

## 2014-11-09 ENCOUNTER — Ambulatory Visit (HOSPITAL_COMMUNITY): Payer: Self-pay | Admitting: Clinical

## 2014-11-10 ENCOUNTER — Ambulatory Visit (INDEPENDENT_AMBULATORY_CARE_PROVIDER_SITE_OTHER): Payer: Federal, State, Local not specified - PPO | Admitting: Psychiatry

## 2014-11-10 ENCOUNTER — Encounter (HOSPITAL_COMMUNITY): Payer: Self-pay | Admitting: Psychiatry

## 2014-11-10 VITALS — BP 150/88 | HR 76 | Ht 70.0 in | Wt 199.0 lb

## 2014-11-10 DIAGNOSIS — I10 Essential (primary) hypertension: Secondary | ICD-10-CM | POA: Insufficient documentation

## 2014-11-10 DIAGNOSIS — F1010 Alcohol abuse, uncomplicated: Secondary | ICD-10-CM | POA: Diagnosis not present

## 2014-11-10 DIAGNOSIS — F4310 Post-traumatic stress disorder, unspecified: Secondary | ICD-10-CM

## 2014-11-10 DIAGNOSIS — R45850 Homicidal ideations: Secondary | ICD-10-CM

## 2014-11-10 DIAGNOSIS — F331 Major depressive disorder, recurrent, moderate: Secondary | ICD-10-CM

## 2014-11-10 DIAGNOSIS — F1210 Cannabis abuse, uncomplicated: Secondary | ICD-10-CM

## 2014-11-10 HISTORY — DX: Essential (primary) hypertension: I10

## 2014-11-10 MED ORDER — LAMOTRIGINE 200 MG PO TABS: 200.0000 mg | ORAL_TABLET | Freq: Every day | ORAL | Status: DC

## 2014-11-10 MED ORDER — QUETIAPINE FUMARATE 50 MG PO TABS: 50.0000 mg | ORAL_TABLET | Freq: Two times a day (BID) | ORAL | Status: DC

## 2014-11-10 MED ORDER — MIRTAZAPINE 30 MG PO TABS: 30.0000 mg | ORAL_TABLET | Freq: Every day | ORAL | Status: DC
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
## 2014-11-13 ENCOUNTER — Other Ambulatory Visit (HOSPITAL_COMMUNITY): Payer: Federal, State, Local not specified - PPO | Attending: Psychiatry | Admitting: Psychiatry

## 2014-11-13 ENCOUNTER — Encounter (HOSPITAL_COMMUNITY): Payer: Self-pay | Admitting: Psychiatry

## 2014-11-13 DIAGNOSIS — F411 Generalized anxiety disorder: Secondary | ICD-10-CM | POA: Diagnosis not present

## 2014-11-13 DIAGNOSIS — R45851 Suicidal ideations: Secondary | ICD-10-CM | POA: Diagnosis not present

## 2014-11-13 DIAGNOSIS — Z62810 Personal history of physical and sexual abuse in childhood: Secondary | ICD-10-CM | POA: Insufficient documentation

## 2014-11-13 DIAGNOSIS — F4310 Post-traumatic stress disorder, unspecified: Secondary | ICD-10-CM | POA: Diagnosis not present

## 2014-11-13 DIAGNOSIS — F332 Major depressive disorder, recurrent severe without psychotic features: Secondary | ICD-10-CM | POA: Diagnosis present

## 2014-11-13 DIAGNOSIS — F1010 Alcohol abuse, uncomplicated: Secondary | ICD-10-CM

## 2014-11-13 DIAGNOSIS — F1210 Cannabis abuse, uncomplicated: Secondary | ICD-10-CM

## 2014-11-13 DIAGNOSIS — I10 Essential (primary) hypertension: Secondary | ICD-10-CM | POA: Diagnosis not present

## 2014-11-13 DIAGNOSIS — F331 Major depressive disorder, recurrent, moderate: Secondary | ICD-10-CM
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
## 2014-11-13 NOTE — Progress Notes (Signed)
    Daily Group Progress Note  Program: IOP  Group Time: 9:00-10:30  Participation Level: Active  Behavioral Response: Appropriate  Type of Therapy:  Psycho-education Group  Summary of Progress: Pt. Participated in medication management group with Jiles Garter.     Group Time: 10:30-12:00  Participation Level:  Active  Behavioral Response: Appropriate  Type of Therapy: Group Therapy  Summary of Progress: Pt. Talked appropriately and makes appropriate eye contact. Pt. Discussed ongoing problem of work dissatisfaction and challenge of making decision about whether or not to return to work.  Nancie Neas, LPC

## 2014-11-13 NOTE — Addendum Note (Signed)
Addended by: Sherlyn Hay on: 11/13/2014 02:27 PM   Modules accepted: Medications

## 2014-11-14 ENCOUNTER — Encounter (HOSPITAL_COMMUNITY): Payer: Self-pay | Admitting: Psychiatry

## 2014-11-14 ENCOUNTER — Other Ambulatory Visit (HOSPITAL_COMMUNITY): Payer: Federal, State, Local not specified - PPO | Admitting: Psychiatry

## 2014-11-14 DIAGNOSIS — F331 Major depressive disorder, recurrent, moderate: Secondary | ICD-10-CM

## 2014-11-14 DIAGNOSIS — F332 Major depressive disorder, recurrent severe without psychotic features: Secondary | ICD-10-CM | POA: Diagnosis not present
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
## 2014-11-14 NOTE — Progress Notes (Signed)
    Daily Group Progress Note  Program: IOP     Group Time: 1045-1215  Participation Level:  Active  Behavioral Response: Appropriate and Sharing  Type of Therapy: Psycho-education Group  Summary of Progress: The conflict cycle:  Discussed the beliefs and attitudes about conflict, how it occurs, response to a conflict and the consequences of conflict.          

## 2014-11-15 ENCOUNTER — Telehealth (HOSPITAL_COMMUNITY): Payer: Self-pay | Admitting: Psychiatry

## 2014-11-15 ENCOUNTER — Other Ambulatory Visit (HOSPITAL_COMMUNITY): Payer: Federal, State, Local not specified - PPO

## 2014-11-16 ENCOUNTER — Other Ambulatory Visit (HOSPITAL_COMMUNITY): Payer: Federal, State, Local not specified - PPO | Admitting: Psychiatry

## 2014-11-16 DIAGNOSIS — F331 Major depressive disorder, recurrent, moderate: Secondary | ICD-10-CM

## 2014-11-16 DIAGNOSIS — F332 Major depressive disorder, recurrent severe without psychotic features: Secondary | ICD-10-CM | POA: Diagnosis not present

## 2014-11-16 NOTE — Progress Notes (Signed)
    Daily Group Progress Note  Program: IOP  Group Time: 9:00-10:30  Participation Level: Active  Behavioral Response: Appropriate  Type of Therapy:  Group Therapy  Summary of Progress: Pt. Presented as talkative, made appropriate eye contact. Pt. Discussed history of interpersonal conflict at work, continued grief related to friend's murder two years ago at her current workplace.     Group Time: 10:30-12:00  Participation Level:  Active  Behavioral Response: Appropriate  Type of Therapy: Psycho-education Group  Summary of Progress: Pt. Participated in discussion about developing routine after group including setting daily intention and planning activities that reflect desire for health, balance, and peace.  Nancie Neas, LPC
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
## 2014-11-17 ENCOUNTER — Other Ambulatory Visit (HOSPITAL_COMMUNITY): Payer: Federal, State, Local not specified - PPO

## 2014-11-20 ENCOUNTER — Other Ambulatory Visit (HOSPITAL_COMMUNITY): Payer: Federal, State, Local not specified - PPO | Admitting: Psychiatry

## 2014-11-20 DIAGNOSIS — F332 Major depressive disorder, recurrent severe without psychotic features: Secondary | ICD-10-CM | POA: Diagnosis not present

## 2014-11-20 NOTE — Progress Notes (Signed)
    Daily Group Progress Note  Program: IOP  Group Time: 9:00-10:30  Participation Level: Active  Behavioral Response: Appropriate  Type of Therapy:  Group Therapy  Summary of Progress: Pt. Smiled and talked appropriately. Discussed that medication that she started last week was over-sedating and was not able to function Thursday and Friday therefore discontinued the medication. Pt. Discussed mental health concerns about youngest adult daughter and challenge of establishing healthy boundaries with her, history of hoarding animals, and desire to change careers and seeking new job that provides meaning and sense of purpose for her life.      Group Time: 10:30-12:00  Participation Level:  Active  Behavioral Response: Appropriate  Type of Therapy: Psycho-education Group  Summary of Progress: Pt. Watched and discussed Cristie Hem video about benefits of self-compassion and method for developing.   Nancie Neas, LPC

## 2014-11-21 ENCOUNTER — Other Ambulatory Visit (HOSPITAL_COMMUNITY): Payer: Federal, State, Local not specified - PPO | Admitting: Psychiatry

## 2014-11-21 DIAGNOSIS — F332 Major depressive disorder, recurrent severe without psychotic features: Secondary | ICD-10-CM | POA: Diagnosis not present

## 2014-11-21 DIAGNOSIS — F331 Major depressive disorder, recurrent, moderate: Secondary | ICD-10-CM

## 2014-11-21 NOTE — Progress Notes (Signed)
    Daily Group Progress Note  Program: IOP  Group Time: 3009-2330  Participation Level: Active  Behavioral Response: Appropriate, Sharing and Attention-Seeking  Type of Therapy:  Group Therapy  Summary of Progress: Pt discussed at length her turbulent relationship with her daughters; especially the oldest one.  Pt states she doesn't quite know where the relationship started having problems.  According to pt she has been setting better boundaries with her.  Pt also discussed that she wished that her family would be there whenever she needed them.  "My mother will clean my daughter's home from top to bottom; but the other morning she was over I know she saw I had a sink full of dishes, but she didn't bother to wash a dish for me."  Encouraged pt to be assertive and ask for what she want and not assume that people can read her mind.  Pt states she will discuss this with her mother today.     Group Time: 1100-1200  Participation Level:  Active  Behavioral Response: Appropriate, Sharing and Attention-Seeking  Type of Therapy: Psycho-education Group  Summary of Progress: Discussed warning signs of relapse, creating a time management plan, and explored services provided within the community for the ongoing recovery process.    Dellia Nims, M.Ed

## 2014-11-22 ENCOUNTER — Other Ambulatory Visit (HOSPITAL_COMMUNITY): Payer: Federal, State, Local not specified - PPO | Admitting: Psychiatry

## 2014-11-22 DIAGNOSIS — F331 Major depressive disorder, recurrent, moderate: Secondary | ICD-10-CM

## 2014-11-22 DIAGNOSIS — F4310 Post-traumatic stress disorder, unspecified: Secondary | ICD-10-CM

## 2014-11-22 DIAGNOSIS — F332 Major depressive disorder, recurrent severe without psychotic features: Secondary | ICD-10-CM | POA: Diagnosis not present

## 2014-11-22 NOTE — Progress Notes (Signed)
    Daily Group Progress Note  Program: IOP  Group Time: 9:00-10:30  Participation Level: Active  Behavioral Response: Appropriate  Type of Therapy:  Group Therapy  Summary of Progress: Pt. Presented as talkative, energetic, smiled appropriately. Pt. Reported that she was able to complete several errands yesterday and went home and washed the dishes. Pt. Recognized change in level of motivation and complete routine tasks.      Group Time: 10:30-12:00  Participation Level:  Active  Behavioral Response: Appropriate  Type of Therapy: Psycho-education Group  Summary of Progress: Pt. Watched inside-out and participated in discussion about the purpose of feelings.   Nancie Neas, LPC

## 2014-11-23 ENCOUNTER — Other Ambulatory Visit (HOSPITAL_COMMUNITY): Payer: Federal, State, Local not specified - PPO | Admitting: Psychiatry

## 2014-11-23 ENCOUNTER — Ambulatory Visit (HOSPITAL_COMMUNITY): Payer: Self-pay | Admitting: Clinical

## 2014-11-23 DIAGNOSIS — F332 Major depressive disorder, recurrent severe without psychotic features: Secondary | ICD-10-CM | POA: Diagnosis not present

## 2014-11-23 NOTE — Progress Notes (Signed)
    Daily Group Progress Note  Program: IOP  Group Time: 3013-1438  Participation Level: Active  Behavioral Response: Appropriate, Sharing and Monopolizing  Type of Therapy:  Group Therapy  Summary of Progress: Patient discussed how angry she got with her oldest daughter yesterday.  Pt realizes that she needs to keep her distance from her at this time; but yet she went to visit her yesterday.  "I went to run an errand with her and I was so ready to get back to my car."     Group Time: 1100-1200  Participation Level:  Active  Behavioral Response: Appropriate, Sharing and Monopolizing  Type of Therapy: Psycho-education Group  Summary of Progress: Processed the movie that was watched yesterday (Inside-Out).  Activity had two parts.  The first part the various emotions were defined and the second part the patient had to list times he/she felt certain emotions.  Dellia Nims, M.Ed

## 2014-11-24 ENCOUNTER — Other Ambulatory Visit (HOSPITAL_COMMUNITY): Payer: Federal, State, Local not specified - PPO | Admitting: Psychiatry

## 2014-11-24 DIAGNOSIS — F332 Major depressive disorder, recurrent severe without psychotic features: Secondary | ICD-10-CM | POA: Diagnosis not present
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
## 2014-11-25 ENCOUNTER — Other Ambulatory Visit (HOSPITAL_COMMUNITY): Payer: Self-pay | Admitting: Psychiatry

## 2014-11-27 ENCOUNTER — Other Ambulatory Visit (HOSPITAL_COMMUNITY): Payer: Federal, State, Local not specified - PPO | Attending: Psychiatry | Admitting: Psychiatry

## 2014-11-27 DIAGNOSIS — F419 Anxiety disorder, unspecified: Secondary | ICD-10-CM | POA: Diagnosis not present

## 2014-11-27 DIAGNOSIS — F331 Major depressive disorder, recurrent, moderate: Secondary | ICD-10-CM

## 2014-11-27 DIAGNOSIS — I10 Essential (primary) hypertension: Secondary | ICD-10-CM | POA: Diagnosis not present

## 2014-11-27 DIAGNOSIS — F4310 Post-traumatic stress disorder, unspecified: Secondary | ICD-10-CM

## 2014-11-27 DIAGNOSIS — F332 Major depressive disorder, recurrent severe without psychotic features: Secondary | ICD-10-CM | POA: Diagnosis present

## 2014-11-27 NOTE — Progress Notes (Signed)
    Daily Group Progress Note  Program: IOP  Group Time: 9:00-10:30  Participation Level: Active  Behavioral Response: Appropriate  Type of Therapy:  Psycho-education Group  Summary of Progress: Pt. Participated in medication education group with [place].     Group Time: 10:30-12:00  Participation Level:  Active  Behavioral Response: Appropriate  Type of Therapy: Group Therapy  Summary of Progress: Pt. Presented as angry and tearful, anxious about return to work which is scheduled for Wednesday. Pt. Reports that co-workers are not supportive of her and do not understand her depression and create hostile environment where she feels constantly judged.   Nancie Neas, LPC

## 2014-11-27 NOTE — Patient Instructions (Signed)
Patient will discharge tomorrow (11-28-14).  Will follow up with [name], LCSW on 12-07-14 @ 9 a.m and Dr. Adele Schilder on 12-18-14 @ 3 pm.  Encouraged support groups.

## 2014-11-27 NOTE — Telephone Encounter (Signed)
Refill request for patient's Klonopin was declined as was discontinued by Dr. Adele Schilder on 11/10/14.  Patient now in IOP and returns to see Dr. Adele Schilder on 12/18/14.

## 2014-11-28 ENCOUNTER — Other Ambulatory Visit (HOSPITAL_COMMUNITY): Payer: Federal, State, Local not specified - PPO | Admitting: Psychiatry

## 2014-11-28 DIAGNOSIS — F4310 Post-traumatic stress disorder, unspecified: Secondary | ICD-10-CM

## 2014-11-28 DIAGNOSIS — F332 Major depressive disorder, recurrent severe without psychotic features: Secondary | ICD-10-CM | POA: Diagnosis not present

## 2014-11-28 NOTE — Progress Notes (Signed)
Patient ID: [name], female   DOB: July 08, 1966, 49 y.o.   MRN: [id number] Discharge Note  Patient:  [name] is an 49 y.o., female DOB:  Jan 12, 1966  Date of Admission:  11/14/2014  Date of Discharge:  11/28/2014  Reason for Admission:depression and anxiety  IOP Course:  Participated and attended group.  No new insights she said but is working on making a daily schedule to limit times of stress and maximize times to enjoy herself.  Has identified time on the phone with family as a stress, plans to take a lunch half hour out of the office, read, walk the dog, etc.  Just having a written plan has helped rather than winging it and feeding into her unhappiness.  Still believes she will not be successful in returning to work but plans to give herself a day at home tomorrow and go back to work the day after.  Anxiety has increased but she plans to go back she says.  Still pursuing disability.  Mental Status at [place], depressed but willing to give returning to work a shot  Lab Results: No results found for this or any previous visit (from the past 25 hour(s)).   Current outpatient prescriptions:  .  anagrelide (AGRYLIN) 0.5 MG capsule, TAKE ONE CAPSULE BY MOUTH THREE TIMES DAILY FOR THROMBOCYTHEMIA, Disp: 90 capsule, Rfl: 0 .  aspirin 81 MG tablet, Take 1 tablet (81 mg total) by mouth daily., Disp: 30 tablet, Rfl: 0 .  hydroxyurea (HYDREA) 500 MG capsule, TAKE 2 CAPSULES BY MOUTH EVERY DAY EXCEPT ON TUESDAYS AND THURSDAYS TAKE 3 CAPSULES, Disp: 68 capsule, Rfl: 1 .  ibuprofen (ADVIL,MOTRIN) 400 MG tablet, Take 400 mg by mouth every 6 (six) hours as needed., Disp: , Rfl:  .  lamoTRIgine (LAMICTAL) 200 MG tablet, Take 1 tablet (200 mg total) by mouth daily., Disp: 30 tablet, Rfl: 0 .  losartan-hydrochlorothiazide (HYZAAR) 50-12.5 MG per tablet, Take 1 tablet by mouth daily. For hypertension., Disp: , Rfl:  .  metoprolol tartrate (LOPRESSOR) 25 MG tablet, Take 25 mg by mouth 2  (two) times daily. , Disp: , Rfl:  .  mirtazapine (REMERON) 30 MG tablet, Take 1 tablet (30 mg total) by mouth at bedtime., Disp: 30 tablet, Rfl: 0 .  Multiple Vitamin (MULTIVITAMIN) tablet, Take 1 tablet by mouth daily., Disp: , Rfl:  .  QUEtiapine (SEROQUEL) 50 MG tablet, Take 1 tablet (50 mg total) by mouth 2 (two) times daily., Disp: 60 tablet, Rfl: 0 .  SUMAtriptan (IMITREX) 50 MG tablet, , Disp: , Rfl:   Axis Diagnosis:  Major depression, recurrent, severe without psychosis   Level of Care:  IOP  Discharge destination:  Other:  return to outpatient psychiatry and therapy  Is patient on multiple antipsychotic therapies at discharge:  No    Has Patient had three or more failed trials of antipsychotic monotherapy by history:  No  Patient phone:  [phone] (home)  Patient address:   [address] [address] [place] [address],   Follow-up recommendations:  Activity:  continue current activity Diet:  continue current diet  Comments:  Some better, still not convinced she will be successful at returning to work and being able to cope  The patient received suicide prevention pamphlet:  Yes Belongings returned:  na  TAYLOR,GERALD D 11/28/2014, 9:50 AM
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
## 2014-11-28 NOTE — Progress Notes (Signed)
    Daily Group Progress Note  Program: IOP  Group Time: 9794-8016  Participation Level: Active  Behavioral Response: Appropriate and Sharing  Type of Therapy:  Group Therapy  Summary of Progress: Patient completed MH-IOP today.  Pt acknowledged that she was in "some kind of way" yesterday and today.  "I was very anxious yesterday and this morning because today is my last day."  Pt states she has been working on writing down how she plans to maintain structure.  "It's on paper and I have the intention to do it; but it may change."  Encouraged patient to stop being negative and continue to push herself to do self care tasks.     Group Time: 1100-1200  Participation Level:  Active  Behavioral Response: Appropriate and Sharing  Type of Therapy: Psycho-education Group  Summary of Progress: [name] came from [place] to educate patients about the groups they offer; which provide ways to maintain wellness, coping with stress and how to manage their symptoms.    Dellia Nims, M.Ed

## 2014-11-29 ENCOUNTER — Other Ambulatory Visit (HOSPITAL_COMMUNITY): Payer: Federal, State, Local not specified - PPO

## 2014-11-30 ENCOUNTER — Other Ambulatory Visit (HOSPITAL_COMMUNITY): Payer: Federal, State, Local not specified - PPO

## 2014-11-30 ENCOUNTER — Ambulatory Visit (HOSPITAL_COMMUNITY): Payer: Self-pay

## 2014-12-01 ENCOUNTER — Other Ambulatory Visit (HOSPITAL_COMMUNITY): Payer: Federal, State, Local not specified - PPO

## 2014-12-04 ENCOUNTER — Other Ambulatory Visit: Payer: Self-pay | Admitting: Hematology and Oncology

## 2014-12-04 ENCOUNTER — Other Ambulatory Visit (HOSPITAL_COMMUNITY): Payer: Federal, State, Local not specified - PPO

## 2014-12-07 ENCOUNTER — Ambulatory Visit (HOSPITAL_COMMUNITY): Payer: Self-pay | Admitting: Clinical

## 2014-12-07 ENCOUNTER — Telehealth (HOSPITAL_COMMUNITY): Payer: Self-pay

## 2014-12-07 MED ORDER — HYDROXYZINE PAMOATE 25 MG PO CAPS: 25.0000 mg | ORAL_CAPSULE | Freq: Two times a day (BID) | ORAL | Status: DC | PRN

## 2014-12-07 NOTE — Telephone Encounter (Signed)
Telephone message received from patient requesting Dr. Adele Schilder restart her on Clonazepam as states "Seroquel is not working for me".  Patient stated she would like Dr. Adele Schilder to put her back on Clonazepam and requests an order be called into her pharmacy.  Patient returns to see Dr. Adele Schilder on 12/20/14.

## 2014-12-07 NOTE — Telephone Encounter (Signed)
I returned patient's phone call.  She is requesting to go back on Klonopin which was discontinued as patient relapsed into drinking.  She recently finished IOP.  She does not want to take Seroquel because it is making her very sleepy, tingling in her legs and groggy.  She has never tried Vistaril for anxiety and I suggested to try Vistaril 25 mg as needed for anxiety.  I discussed medication side effects.  Patient reported that she has been not drinking for more than a month however we will discuss this issue more when she comes for appointment.  I recommended to call us back if she has any question or any side effects with the Vistaril.

## 2014-12-18 ENCOUNTER — Ambulatory Visit (HOSPITAL_COMMUNITY): Payer: Self-pay | Admitting: Psychiatry

## 2014-12-20 ENCOUNTER — Encounter (HOSPITAL_COMMUNITY): Payer: Self-pay | Admitting: Psychiatry

## 2014-12-20 ENCOUNTER — Ambulatory Visit (INDEPENDENT_AMBULATORY_CARE_PROVIDER_SITE_OTHER): Payer: Federal, State, Local not specified - PPO | Admitting: Psychiatry

## 2014-12-20 VITALS — BP 129/83 | HR 58 | Ht 70.0 in | Wt 199.8 lb

## 2014-12-20 DIAGNOSIS — F4310 Post-traumatic stress disorder, unspecified: Secondary | ICD-10-CM | POA: Diagnosis not present

## 2014-12-20 DIAGNOSIS — F331 Major depressive disorder, recurrent, moderate: Secondary | ICD-10-CM

## 2014-12-20 DIAGNOSIS — F339 Major depressive disorder, recurrent, unspecified: Secondary | ICD-10-CM | POA: Diagnosis not present

## 2014-12-20 DIAGNOSIS — F1010 Alcohol abuse, uncomplicated: Secondary | ICD-10-CM

## 2014-12-20 DIAGNOSIS — F1210 Cannabis abuse, uncomplicated: Secondary | ICD-10-CM | POA: Diagnosis not present

## 2014-12-20 MED ORDER — LAMOTRIGINE 200 MG PO TABS: 200.0000 mg | ORAL_TABLET | Freq: Every day | ORAL | Status: DC

## 2014-12-20 MED ORDER — MIRTAZAPINE 30 MG PO TABS: 30.0000 mg | ORAL_TABLET | Freq: Every day | ORAL | Status: DC

## 2014-12-20 MED ORDER — HYDROXYZINE PAMOATE 25 MG PO CAPS: 25.0000 mg | ORAL_CAPSULE | Freq: Every day | ORAL | Status: DC
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
## 2014-12-21 ENCOUNTER — Telehealth (HOSPITAL_COMMUNITY): Payer: Self-pay | Admitting: Psychiatry

## 2014-12-21 NOTE — Telephone Encounter (Signed)
D:  Completed FMLA form, excusing patient from work (11-13-14 through 12-03-14).  Return to work on 12-04-14.  A:  Called pt and left vm that form is ready for her to pick up (per pt's request).  Informed Beather Arbour, RN (clinic).

## 2014-12-29 ENCOUNTER — Other Ambulatory Visit (HOSPITAL_COMMUNITY): Payer: Self-pay | Admitting: Psychiatry

## 2014-12-29 ENCOUNTER — Other Ambulatory Visit: Payer: Self-pay | Admitting: Hematology and Oncology

## 2015-01-01 ENCOUNTER — Ambulatory Visit (INDEPENDENT_AMBULATORY_CARE_PROVIDER_SITE_OTHER): Payer: Federal, State, Local not specified - PPO | Admitting: Clinical

## 2015-01-01 ENCOUNTER — Encounter (HOSPITAL_COMMUNITY): Payer: Self-pay | Admitting: Clinical

## 2015-01-01 DIAGNOSIS — F4310 Post-traumatic stress disorder, unspecified: Secondary | ICD-10-CM | POA: Diagnosis not present

## 2015-01-01 DIAGNOSIS — F331 Major depressive disorder, recurrent, moderate: Secondary | ICD-10-CM | POA: Diagnosis not present
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
## 2015-01-01 NOTE — Telephone Encounter (Signed)
Refill request for patient's prescribed Remeron and Lamictal declined as new orders were e-scribed on 12/20/14 plus one refill.

## 2015-01-03 NOTE — Progress Notes (Signed)
   THERAPIST PROGRESS NOTE  Session Time: 8:12 -8:58  Type of Therapy: Individual Therapy  Treatment Goals addressed:   improve psychiatric symptoms, Update Assessment   Interventions:  Motivational Interviewing  Summary: [name] is a 49 y.o. female who presents with Major Depressive Disorder, and PTSD   Suicidal/Homicidal: No -without intent/plan  Therapist Response:  [name] met with clinician for an individual session. [name] discussed her psychiatric symptoms, her current life events. [name] shared that she had returned to IOP. She shared that she is till having trouble going to work. She shared that March was the anniversary of her friends death and birthday. [name] shared that  She has had flashbacks and other PTSD symptoms. She shared it is especially difficult for her to go to work where her friend had worked. She reports her depression has been overwhelming. Her assessment has been updated to reflect a PTSD diagnosis. [name] denied any current suicidal or homicidal ideation. She shared that she sometimes wishes she could disappear but does not want to harm herself.  Plan: Return again in 1-2 weeks.  Diagnosis:     Axis I: Major Depressive Disorder, and PTSD     Kunta Hilleary A, LCSW 01/03/2015

## 2015-01-18 ENCOUNTER — Ambulatory Visit (HOSPITAL_COMMUNITY): Payer: Self-pay | Admitting: Clinical

## 2015-01-31 ENCOUNTER — Ambulatory Visit (HOSPITAL_COMMUNITY): Payer: Self-pay | Admitting: Clinical

## 2015-02-01 ENCOUNTER — Telehealth: Payer: Self-pay | Admitting: Hematology and Oncology

## 2015-02-01 ENCOUNTER — Other Ambulatory Visit (HOSPITAL_BASED_OUTPATIENT_CLINIC_OR_DEPARTMENT_OTHER): Payer: Federal, State, Local not specified - PPO

## 2015-02-01 ENCOUNTER — Ambulatory Visit (HOSPITAL_BASED_OUTPATIENT_CLINIC_OR_DEPARTMENT_OTHER): Payer: Federal, State, Local not specified - PPO | Admitting: Hematology and Oncology

## 2015-02-01 ENCOUNTER — Encounter: Payer: Self-pay | Admitting: Hematology and Oncology

## 2015-02-01 VITALS — BP 143/87 | HR 67 | Temp 98.2°F | Resp 18 | Ht 70.0 in | Wt 198.3 lb

## 2015-02-01 DIAGNOSIS — D473 Essential (hemorrhagic) thrombocythemia: Secondary | ICD-10-CM

## 2015-02-01 DIAGNOSIS — D630 Anemia in neoplastic disease: Secondary | ICD-10-CM

## 2015-02-01 LAB — CBC WITH DIFFERENTIAL/PLATELET
BASO%: 1.4 % (ref 0.0–2.0)
BASOS ABS: 0.1 10*3/uL (ref 0.0–0.1)
EOS%: 0.6 % (ref 0.0–7.0)
Eosinophils Absolute: 0 10*3/uL (ref 0.0–0.5)
HCT: 35.5 % (ref 34.8–46.6)
HEMOGLOBIN: 11.4 g/dL — AB (ref 11.6–15.9)
LYMPH%: 30.2 % (ref 14.0–49.7)
MCH: 30.3 pg (ref 25.1–34.0)
MCHC: 32.1 g/dL (ref 31.5–36.0)
MCV: 94.4 fL (ref 79.5–101.0)
MONO#: 0.7 10*3/uL (ref 0.1–0.9)
MONO%: 11.2 % (ref 0.0–14.0)
NEUT#: 3.6 10*3/uL (ref 1.5–6.5)
NEUT%: 56.6 % (ref 38.4–76.8)
Platelets: 498 10*3/uL — ABNORMAL HIGH (ref 145–400)
RBC: 3.76 10*6/uL (ref 3.70–5.45)
RDW: 20.4 % — ABNORMAL HIGH (ref 11.2–14.5)
WBC: 6.4 10*3/uL (ref 3.9–10.3)
lymph#: 1.9 10*3/uL (ref 0.9–3.3)
nRBC: 6 % — ABNORMAL HIGH (ref 0–0)

## 2015-02-01 LAB — TECHNOLOGIST REVIEW

## 2015-02-01 NOTE — Telephone Encounter (Signed)
Gave and printed appt sched and avs fo rpt; for NOV  °

## 2015-02-01 NOTE — Assessment & Plan Note (Signed)
This is likely anemia of chronic disease. The patient denies recent history of bleeding such as epistaxis, hematuria or hematochezia. She is asymptomatic from the anemia. We will observe for now.  She does not require transfusion now.   

## 2015-02-01 NOTE — Assessment & Plan Note (Signed)
She was treated with combination of hydroxyurea and anagrelide in the past but had been off treatment for 6 months due to major depression. To simplify her treatment, I recommend we just restarted back on anagrelide only. I am concerned with anemia and prior leukopenia that I do not take she will tolerate hydroxyurea well. She tolerated anagrelide 0.5 mg daily very well without any side effects and her platelet count is responding I recommend we continue the same. In the meantime, she will continue 81 mg aspirin to prevent risk of blood clot.

## 2015-02-01 NOTE — Progress Notes (Signed)
[name] OFFICE PROGRESS NOTE  Patient Care Team: Darcus Austin, MD as PCP - General (Family Medicine)  SUMMARY OF ONCOLOGIC HISTORY:  I reviewed the patient's records extensive and collaborated the history with the patient. Summary of her history is as follows: This patient was originally diagnosed with myeloproliferative disorder approximately 10 years ago. According to her, she have chronic back pain. MRI showed abnormal bone marrow signal and she was subsequently referred to see an oncologist. Bone marrow biopsy dated 12/10/2004, confirm essential thrombocytosis. The patient was started on Hydroxyurea 1000 mg daily for 5 days and 1500 mg on Tuesdays and Fridays and anagrelide 0.5 mg twice daily. She has poor compliance which she attributed to major depression. She self discontinued medication for over 6 months in 2016 to remain on aspirin to prevent risk of blood clots. She has frequent sweats which she attributed to menopausal symptoms. The patient denies any recent signs or symptoms of bleeding such as spontaneous epistaxis, hematuria or hematochezia.  INTERVAL HISTORY: Please see below for problem oriented charting. She feels well. She is compliant taking anagrelide 0.5 mg daily.  REVIEW OF SYSTEMS:   Constitutional: Denies fevers, chills or abnormal weight loss Eyes: Denies blurriness of vision Ears, nose, mouth, throat, and face: Denies mucositis or sore throat Respiratory: Denies cough, dyspnea or wheezes Cardiovascular: Denies palpitation, chest discomfort or lower extremity swelling Gastrointestinal:  Denies nausea, heartburn or change in bowel habits Skin: Denies abnormal skin rashes Lymphatics: Denies new lymphadenopathy or easy bruising Neurological:Denies numbness, tingling or new weaknesses Behavioral/Psych: Mood is stable, no new changes  All other systems were reviewed with the patient and are negative.  I have reviewed the past medical history, past  surgical history, social history and family history with the patient and they are unchanged from previous note.  ALLERGIES:  has No Known Allergies.  MEDICATIONS:  Current Outpatient Prescriptions  Medication Sig Dispense Refill  . anagrelide (AGRYLIN) 0.5 MG capsule Take 0.5 mg by mouth daily.    Marland Kitchen aspirin 81 MG tablet Take 1 tablet (81 mg total) by mouth daily. 30 tablet 0  . hydrochlorothiazide (MICROZIDE) 12.5 MG capsule   1  . ibuprofen (ADVIL,MOTRIN) 400 MG tablet Take 400 mg by mouth every 6 (six) hours as needed.    . lamoTRIgine (LAMICTAL) 200 MG tablet Take 1 tablet (200 mg total) by mouth daily. 30 tablet 1  . losartan-hydrochlorothiazide (HYZAAR) 100-12.5 MG per tablet     . metoprolol tartrate (LOPRESSOR) 25 MG tablet Take 25 mg by mouth 2 (two) times daily.     . mirtazapine (REMERON) 30 MG tablet Take 1 tablet (30 mg total) by mouth at bedtime. 30 tablet 1  . Multiple Vitamin (MULTIVITAMIN) tablet Take 1 tablet by mouth daily.     No current facility-administered medications for this visit.    PHYSICAL EXAMINATION: ECOG PERFORMANCE STATUS: 0 - Asymptomatic  Filed Vitals:   02/01/15 1014  BP: 143/87  Pulse: 67  Temp: 98.2 F (36.8 C)  Resp: 18   Filed Weights   02/01/15 1014  Weight: 198 lb 4.8 oz (89.948 kg)    GENERAL:alert, no distress and comfortable SKIN: skin color, texture, turgor are normal, no rashes or significant lesions EYES: normal, Conjunctiva are pink and non-injected, sclera clear Musculoskeletal:no cyanosis of digits and no clubbing  NEURO: alert & oriented x 3 with fluent speech, no focal motor/sensory deficits  LABORATORY DATA:  I have reviewed the data as listed    Component Value  Date/Time   NA 141 11/02/2014 1308   NA 139 04/21/2013 1027   K 3.8 11/02/2014 1308   K 3.6 04/21/2013 1027   CL 100 04/21/2013 1027   CL 106 10/06/2012 1002   CO2 24 11/02/2014 1308   CO2 27 04/21/2013 1027   GLUCOSE 99 11/02/2014 1308   GLUCOSE 88  04/21/2013 1027   GLUCOSE 104* 10/06/2012 1002   BUN 9.8 11/02/2014 1308   BUN 14 04/21/2013 1027   CREATININE 0.9 11/02/2014 1308   CREATININE 1.12* 04/21/2013 1027   CALCIUM 9.2 11/02/2014 1308   CALCIUM 9.4 04/21/2013 1027   PROT 7.1 11/02/2014 1308   PROT 7.6 04/21/2013 1027   ALBUMIN 4.0 11/02/2014 1308   ALBUMIN 4.1 04/21/2013 1027   AST 23 11/02/2014 1308   AST 22 04/21/2013 1027   ALT 22 11/02/2014 1308   ALT 18 04/21/2013 1027   ALKPHOS 118 11/02/2014 1308   ALKPHOS 83 04/21/2013 1027   BILITOT 0.34 11/02/2014 1308   BILITOT 0.3 04/21/2013 1027   GFRNONAA 57* 04/21/2013 1027   GFRAA 66* 04/21/2013 1027    No results found for: SPEP, UPEP  Lab Results  Component Value Date   WBC 6.4 02/01/2015   NEUTROABS 3.6 02/01/2015   HGB 11.4* 02/01/2015   HCT 35.5 02/01/2015   MCV 94.4 02/01/2015   PLT 498* 02/01/2015      Chemistry      Component Value Date/Time   NA 141 11/02/2014 1308   NA 139 04/21/2013 1027   K 3.8 11/02/2014 1308   K 3.6 04/21/2013 1027   CL 100 04/21/2013 1027   CL 106 10/06/2012 1002   CO2 24 11/02/2014 1308   CO2 27 04/21/2013 1027   BUN 9.8 11/02/2014 1308   BUN 14 04/21/2013 1027   CREATININE 0.9 11/02/2014 1308   CREATININE 1.12* 04/21/2013 1027      Component Value Date/Time   CALCIUM 9.2 11/02/2014 1308   CALCIUM 9.4 04/21/2013 1027   ALKPHOS 118 11/02/2014 1308   ALKPHOS 83 04/21/2013 1027   AST 23 11/02/2014 1308   AST 22 04/21/2013 1027   ALT 22 11/02/2014 1308   ALT 18 04/21/2013 1027   BILITOT 0.34 11/02/2014 1308   BILITOT 0.3 04/21/2013 1027      ASSESSMENT & PLAN:  Essential thrombocythemia She was treated with combination of hydroxyurea and anagrelide in the past but had been off treatment for 6 months due to major depression. To simplify her treatment, I recommend we just restarted back on anagrelide only. I am concerned with anemia and prior leukopenia that I do not take she will tolerate hydroxyurea  well. She tolerated anagrelide 0.5 mg daily very well without any side effects and her platelet count is responding I recommend we continue the same. In the meantime, she will continue 81 mg aspirin to prevent risk of blood clot.   Anemia in neoplastic disease This is likely anemia of chronic disease. The patient denies recent history of bleeding such as epistaxis, hematuria or hematochezia. She is asymptomatic from the anemia. We will observe for now.  She does not require transfusion now.       No orders of the defined types were placed in this encounter.   All questions were answered. The patient knows to call the clinic with any problems, questions or concerns. No barriers to learning was detected. I spent 15 minutes counseling the patient face to face. The total time spent in the appointment was 20 minutes  and more than 50% was on counseling and review of test results     [name], Willy Pinkerton, MD 02/01/2015 11:10 AM

## 2015-02-14 ENCOUNTER — Ambulatory Visit (INDEPENDENT_AMBULATORY_CARE_PROVIDER_SITE_OTHER): Payer: Federal, State, Local not specified - PPO | Admitting: Clinical

## 2015-02-14 DIAGNOSIS — F4310 Post-traumatic stress disorder, unspecified: Secondary | ICD-10-CM

## 2015-02-14 DIAGNOSIS — F411 Generalized anxiety disorder: Secondary | ICD-10-CM | POA: Diagnosis not present

## 2015-02-14 DIAGNOSIS — F331 Major depressive disorder, recurrent, moderate: Secondary | ICD-10-CM | POA: Diagnosis not present

## 2015-02-15 NOTE — Progress Notes (Signed)
   THERAPIST PROGRESS NOTE  Session Time: 10:03 -11:00  Participation Level: Active  Behavioral Response: CasualAlertNA  Type of Therapy: Individual Therapy  Treatment Goals addressed:   improve psychiatric symptoms, emotional regulation skills,   Interventions: CBT and Motivational Interviewing  Summary: [name] is [name] 49 y.o. female who presents with Major Depressive Disorder, and PTSD   Suicidal/Homicidal: No -without intent/plan  Therapist Response:  [name] met with clinician for an individual session. [name] discussed her psychiatric symptoms and her current life events. [name] shared that she had missed sessions due to situations beyond her control. She shared that she had been very depressed but was now experiencing some hypo manic symptoms. She shared that she has had burst of energy and interest in doing things and had found herself working away knitting until 5 am. . She shared that it felt good not to be depressed for [name] but she was aware that mania could cause her other trouble. She shared she had had any risky behaviors. She shared though that when she went shopping for yarn, she felt compelled to purchase more than she would otherwise. [name] shared that she felt more driven than she has in [name] long time.  Client and clinician agreed that [name] would speak to her psychiatrist about the symptoms, [name] said she would leave [name] message for him today.  [name] and clinician discussed the symptoms of bipolar, things to watch for and things that would help (like getting enough sleep and postponing purchases).  Client and clinician discussed her other life issues. She shared that she has been going to work though it is still hard. She shared her young daughter may move to [place] soon, and her daughter that lives with her still causes her stress.  [name] shared she would start her homework up again and continue it until next session   Rule out for  Bipolar   Plan: Return again in 1-2 weeks.  Diagnosis:     Axis I: Major Depressive Disorder, and PTSD  Rule out for Bipolar   Wendy Debnam A, LCSW 02/15/2015

## 2015-02-16 ENCOUNTER — Encounter (HOSPITAL_COMMUNITY): Payer: Self-pay | Admitting: Clinical

## 2015-02-21 ENCOUNTER — Encounter (HOSPITAL_COMMUNITY): Payer: Self-pay | Admitting: Psychiatry

## 2015-02-21 ENCOUNTER — Ambulatory Visit (INDEPENDENT_AMBULATORY_CARE_PROVIDER_SITE_OTHER): Payer: Federal, State, Local not specified - PPO | Admitting: Psychiatry

## 2015-02-21 VITALS — BP 136/85 | HR 58 | Ht 70.0 in | Wt 197.2 lb

## 2015-02-21 DIAGNOSIS — F1210 Cannabis abuse, uncomplicated: Secondary | ICD-10-CM

## 2015-02-21 DIAGNOSIS — F1010 Alcohol abuse, uncomplicated: Secondary | ICD-10-CM

## 2015-02-21 DIAGNOSIS — F331 Major depressive disorder, recurrent, moderate: Secondary | ICD-10-CM | POA: Diagnosis not present

## 2015-02-21 DIAGNOSIS — F4310 Post-traumatic stress disorder, unspecified: Secondary | ICD-10-CM

## 2015-02-21 MED ORDER — LAMOTRIGINE 200 MG PO TABS: 200.0000 mg | ORAL_TABLET | Freq: Every day | ORAL | Status: DC

## 2015-02-21 MED ORDER — MIRTAZAPINE 30 MG PO TABS: 30.0000 mg | ORAL_TABLET | Freq: Every day | ORAL | Status: DC
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
## 2015-03-03 ENCOUNTER — Other Ambulatory Visit (HOSPITAL_COMMUNITY): Payer: Self-pay | Admitting: Psychiatry

## 2015-03-13 ENCOUNTER — Ambulatory Visit (HOSPITAL_COMMUNITY): Payer: Self-pay | Admitting: Clinical

## 2015-03-27 ENCOUNTER — Ambulatory Visit (HOSPITAL_COMMUNITY): Payer: Self-pay | Admitting: Clinical

## 2015-04-10 ENCOUNTER — Ambulatory Visit (HOSPITAL_COMMUNITY): Payer: Self-pay | Admitting: Clinical

## 2015-04-24 ENCOUNTER — Ambulatory Visit (HOSPITAL_COMMUNITY): Payer: Self-pay | Admitting: Clinical

## 2015-04-25 ENCOUNTER — Ambulatory Visit (HOSPITAL_COMMUNITY): Payer: Self-pay | Admitting: Psychiatry

## 2015-05-06 ENCOUNTER — Emergency Department (HOSPITAL_BASED_OUTPATIENT_CLINIC_OR_DEPARTMENT_OTHER): Payer: Federal, State, Local not specified - PPO

## 2015-05-06 ENCOUNTER — Emergency Department (HOSPITAL_BASED_OUTPATIENT_CLINIC_OR_DEPARTMENT_OTHER)
Admission: EM | Admit: 2015-05-06 | Discharge: 2015-05-06 | Disposition: A | Discharge status: Home / Self Care | Payer: Federal, State, Local not specified - PPO | Attending: Emergency Medicine | Admitting: Emergency Medicine

## 2015-05-06 ENCOUNTER — Encounter (HOSPITAL_BASED_OUTPATIENT_CLINIC_OR_DEPARTMENT_OTHER): Payer: Self-pay | Admitting: Emergency Medicine

## 2015-05-06 DIAGNOSIS — Z7982 Long term (current) use of aspirin: Secondary | ICD-10-CM | POA: Insufficient documentation

## 2015-05-06 DIAGNOSIS — S8991XA Unspecified injury of right lower leg, initial encounter: Secondary | ICD-10-CM | POA: Insufficient documentation

## 2015-05-06 DIAGNOSIS — Z79899 Other long term (current) drug therapy: Secondary | ICD-10-CM | POA: Diagnosis not present

## 2015-05-06 DIAGNOSIS — I10 Essential (primary) hypertension: Secondary | ICD-10-CM | POA: Diagnosis not present

## 2015-05-06 DIAGNOSIS — Z86018 Personal history of other benign neoplasm: Secondary | ICD-10-CM | POA: Diagnosis not present

## 2015-05-06 DIAGNOSIS — T148 Other injury of unspecified body region: Secondary | ICD-10-CM | POA: Diagnosis not present

## 2015-05-06 DIAGNOSIS — Y92410 Unspecified street and highway as the place of occurrence of the external cause: Secondary | ICD-10-CM | POA: Insufficient documentation

## 2015-05-06 DIAGNOSIS — Z8659 Personal history of other mental and behavioral disorders: Secondary | ICD-10-CM | POA: Insufficient documentation

## 2015-05-06 DIAGNOSIS — Y998 Other external cause status: Secondary | ICD-10-CM | POA: Diagnosis not present

## 2015-05-06 DIAGNOSIS — Y9389 Activity, other specified: Secondary | ICD-10-CM | POA: Diagnosis not present

## 2015-05-06 DIAGNOSIS — T07XXXA Unspecified multiple injuries, initial encounter: Secondary | ICD-10-CM

## 2015-05-06 DIAGNOSIS — S6991XA Unspecified injury of right wrist, hand and finger(s), initial encounter: Secondary | ICD-10-CM | POA: Diagnosis present

## 2015-05-06 MED ORDER — IBUPROFEN 800 MG PO TABS: 800.0000 mg | ORAL_TABLET | Freq: Four times a day (QID) | ORAL | Status: DC | PRN

## 2015-05-06 NOTE — Discharge Instructions (Signed)
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
## 2015-05-06 NOTE — ED Notes (Signed)
Patient transported to X-ray 

## 2015-06-05 ENCOUNTER — Telehealth: Payer: Self-pay | Admitting: Hematology and Oncology

## 2015-06-05 ENCOUNTER — Other Ambulatory Visit: Payer: Self-pay

## 2015-06-05 ENCOUNTER — Ambulatory Visit: Payer: Self-pay | Admitting: Hematology and Oncology

## 2015-06-05 NOTE — Telephone Encounter (Signed)
pt called to r/s appt...done....pt ok and aware °

## 2015-06-13 ENCOUNTER — Ambulatory Visit (INDEPENDENT_AMBULATORY_CARE_PROVIDER_SITE_OTHER): Payer: Federal, State, Local not specified - PPO | Admitting: Clinical

## 2015-06-13 ENCOUNTER — Encounter (HOSPITAL_COMMUNITY): Payer: Self-pay | Admitting: Clinical

## 2015-06-13 DIAGNOSIS — F4310 Post-traumatic stress disorder, unspecified: Secondary | ICD-10-CM | POA: Diagnosis not present

## 2015-06-13 DIAGNOSIS — F331 Major depressive disorder, recurrent, moderate: Secondary | ICD-10-CM | POA: Diagnosis not present

## 2015-06-18 ENCOUNTER — Ambulatory Visit: Payer: Self-pay | Admitting: Hematology and Oncology

## 2015-06-18 ENCOUNTER — Other Ambulatory Visit: Payer: Self-pay

## 2015-06-18 ENCOUNTER — Encounter: Payer: Self-pay | Admitting: Hematology and Oncology

## 2015-06-18 ENCOUNTER — Encounter: Payer: Self-pay | Admitting: *Deleted

## 2015-06-18 NOTE — Progress Notes (Unsigned)
No Show letter placed in outgoing mail to pt's home address.  
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
## 2015-07-03 ENCOUNTER — Encounter (HOSPITAL_COMMUNITY): Payer: Self-pay | Admitting: Clinical

## 2015-07-03 ENCOUNTER — Ambulatory Visit (INDEPENDENT_AMBULATORY_CARE_PROVIDER_SITE_OTHER): Payer: Federal, State, Local not specified - PPO | Admitting: Clinical

## 2015-07-03 DIAGNOSIS — F331 Major depressive disorder, recurrent, moderate: Secondary | ICD-10-CM | POA: Diagnosis not present

## 2015-07-03 DIAGNOSIS — F4310 Post-traumatic stress disorder, unspecified: Secondary | ICD-10-CM
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
## 2015-07-04 ENCOUNTER — Ambulatory Visit (HOSPITAL_COMMUNITY): Payer: Self-pay | Admitting: Clinical

## 2015-07-16 ENCOUNTER — Telehealth: Payer: Self-pay | Admitting: *Deleted

## 2015-07-16 NOTE — Telephone Encounter (Signed)
POF sent to scheduler 

## 2015-07-16 NOTE — Telephone Encounter (Signed)
She sees NP next visit before coming back to me Routine appt
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
## 2015-07-17 ENCOUNTER — Telehealth: Payer: Self-pay | Admitting: Hematology and Oncology

## 2015-07-17 NOTE — Telephone Encounter (Signed)
Spoke with patient re lab/fu with SW 12/29 @ 12:30 pm to arrive 12:15 pm. Per 12/19 pof f/u scheduled with mid-level.

## 2015-07-18 ENCOUNTER — Encounter (HOSPITAL_COMMUNITY): Payer: Self-pay | Admitting: Clinical

## 2015-07-18 ENCOUNTER — Ambulatory Visit (INDEPENDENT_AMBULATORY_CARE_PROVIDER_SITE_OTHER): Payer: Federal, State, Local not specified - PPO | Admitting: Clinical

## 2015-07-18 DIAGNOSIS — F331 Major depressive disorder, recurrent, moderate: Secondary | ICD-10-CM | POA: Diagnosis not present

## 2015-07-18 DIAGNOSIS — F4310 Post-traumatic stress disorder, unspecified: Secondary | ICD-10-CM

## 2015-07-18 NOTE — Progress Notes (Signed)
   THERAPIST PROGRESS NOTE  Session Time: 9:04 - 10:00  Participation Level: Active  Behavioral Response: NeatAlertNA  Type of Therapy: Individual Therapy  Treatment Goals addressed:   improve psychiatric symptoms, elevate mood, emotional regulation, and improve unhelpful thinking patterns  Interventions: CBT and Motivational Interviewing  Summary: [name] is a 49 y.o. female who presents with Major Depressive Disorder, and PTSD   Suicidal/Homicidal: No -without intent/plan  Therapist Response:  [name] met with clinician for an individual session. [name] discussed her psychiatric symptoms, her current life events, and her homework. [name] shared that she was doing a little better this week. She shared she had been doing her grounding and mindfulness techniques.  She shared she was returning to work today though she does not know what they will have her doing. She share that she had bumped in to an old friend. [name] shared that this made her feel good because she remembered briefly what it was like to have friends. [name] has been reluctant to form new friendships since her friend was murdered. Client and clinician discussed her reluctance as well as why she had enjoyed having friends before. [name] shared that since two of her friends had died she felt like it would be too painful to get close to others. Client and clinician discussed her friends that died. [name] shared about feeling connected and having someone to share good and bad feelings with. Clinician asked if she knew the friendships would end would she still have formed them, knowing how much pleasure and enjoyment she got out of them. [name] had the insight that the loss increased with the level of caring she had and that she would have picked to have them as friends even if she knew one day it would end. [name] shared that she planned to call her old friend that she bumped into to make a date for coffee or lunch.  [name] and clinician also discussed ways to improve her mood at work. [name] formulated a few skills that she would be willing to try and report back. [name] agreed to continue her grounding and mindfulness techniques until next session.   Plan: Return again in 1-2 weeks.  Diagnosis:     Axis I: Major Depressive Disorder, and PTSD     , A, LCSW 07/18/2015  

## 2015-07-26 ENCOUNTER — Ambulatory Visit (HOSPITAL_BASED_OUTPATIENT_CLINIC_OR_DEPARTMENT_OTHER): Payer: Federal, State, Local not specified - PPO | Admitting: Physician Assistant

## 2015-07-26 ENCOUNTER — Other Ambulatory Visit (HOSPITAL_BASED_OUTPATIENT_CLINIC_OR_DEPARTMENT_OTHER): Payer: Federal, State, Local not specified - PPO

## 2015-07-26 ENCOUNTER — Encounter: Payer: Self-pay | Admitting: Physician Assistant

## 2015-07-26 ENCOUNTER — Telehealth: Payer: Self-pay | Admitting: Hematology and Oncology

## 2015-07-26 DIAGNOSIS — D630 Anemia in neoplastic disease: Secondary | ICD-10-CM

## 2015-07-26 DIAGNOSIS — F411 Generalized anxiety disorder: Secondary | ICD-10-CM | POA: Diagnosis not present

## 2015-07-26 DIAGNOSIS — D473 Essential (hemorrhagic) thrombocythemia: Secondary | ICD-10-CM

## 2015-07-26 LAB — CBC WITH DIFFERENTIAL/PLATELET
BASO%: 1.1 % (ref 0.0–2.0)
Basophils Absolute: 0.1 10*3/uL (ref 0.0–0.1)
EOS%: 0.8 % (ref 0.0–7.0)
Eosinophils Absolute: 0.1 10*3/uL (ref 0.0–0.5)
HEMATOCRIT: 34.9 % (ref 34.8–46.6)
HGB: 11.3 g/dL — ABNORMAL LOW (ref 11.6–15.9)
LYMPH%: 32.3 % (ref 14.0–49.7)
MCH: 30.3 pg (ref 25.1–34.0)
MCHC: 32.4 g/dL (ref 31.5–36.0)
MCV: 93.6 fL (ref 79.5–101.0)
MONO#: 0.6 10*3/uL (ref 0.1–0.9)
MONO%: 8.6 % (ref 0.0–14.0)
NEUT#: 3.7 10*3/uL (ref 1.5–6.5)
NEUT%: 57.2 % (ref 38.4–76.8)
Platelets: 485 10*3/uL — ABNORMAL HIGH (ref 145–400)
RBC: 3.73 10*6/uL (ref 3.70–5.45)
RDW: 19.8 % — ABNORMAL HIGH (ref 11.2–14.5)
WBC: 6.4 10*3/uL (ref 3.9–10.3)
lymph#: 2.1 10*3/uL (ref 0.9–3.3)
nRBC: 4 % — ABNORMAL HIGH (ref 0–0)

## 2015-07-26 LAB — TECHNOLOGIST REVIEW

## 2015-07-26 NOTE — Telephone Encounter (Signed)
Gave patient avs report and appointments for June.  °
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
## 2015-08-01 ENCOUNTER — Ambulatory Visit (HOSPITAL_COMMUNITY): Payer: Self-pay | Admitting: Clinical

## 2015-08-06 ENCOUNTER — Encounter: Payer: Self-pay | Admitting: *Deleted

## 2015-08-06 ENCOUNTER — Encounter: Payer: Self-pay | Admitting: Hematology and Oncology

## 2015-08-06 NOTE — Progress Notes (Signed)
Letter dated today faxed to Dr. Tonita Cong' office.  Ok to hold Aspirin for cervical epidural injection.

## 2015-08-15 ENCOUNTER — Ambulatory Visit (HOSPITAL_COMMUNITY): Payer: Self-pay | Admitting: Clinical

## 2015-08-23 ENCOUNTER — Encounter: Payer: Self-pay | Admitting: *Deleted

## 2015-08-24 ENCOUNTER — Other Ambulatory Visit: Payer: Self-pay | Admitting: Oncology

## 2015-08-27 ENCOUNTER — Encounter: Payer: Self-pay | Admitting: Hematology and Oncology

## 2015-08-27 ENCOUNTER — Telehealth: Payer: Self-pay | Admitting: *Deleted

## 2015-08-27 NOTE — Telephone Encounter (Signed)
Called Dr. Tonita Cong and informed him per Dr. Alvy Bimler that the Bone marrow changes on MRI are r/t pt's diagnosis of Essential Thrombocytosis,  Pt is on treatment and this does not need any further investigation at this time.  He appreciated the call.

## 2015-08-29 ENCOUNTER — Telehealth: Payer: Self-pay | Admitting: *Deleted

## 2015-08-29 MED ORDER — ANAGRELIDE HCL 0.5 MG PO CAPS: 0.5000 mg | ORAL_CAPSULE | Freq: Every day | ORAL | Status: DC

## 2015-08-29 NOTE — Telephone Encounter (Signed)
Yes pls

## 2015-08-29 NOTE — Telephone Encounter (Signed)
Pt states needs refill on Angrelide sent to [facility].  She takes once daily.

## 2015-08-30 ENCOUNTER — Ambulatory Visit (HOSPITAL_COMMUNITY): Payer: Self-pay | Admitting: Clinical

## 2015-09-26 ENCOUNTER — Ambulatory Visit (HOSPITAL_COMMUNITY): Payer: Self-pay | Admitting: Clinical

## 2015-10-30 DIAGNOSIS — M7542 Impingement syndrome of left shoulder: Secondary | ICD-10-CM | POA: Diagnosis not present

## 2015-10-30 DIAGNOSIS — S46012D Strain of muscle(s) and tendon(s) of the rotator cuff of left shoulder, subsequent encounter: Secondary | ICD-10-CM | POA: Diagnosis not present

## 2015-11-05 ENCOUNTER — Ambulatory Visit (HOSPITAL_COMMUNITY): Payer: Self-pay | Admitting: Psychiatry

## 2015-11-06 ENCOUNTER — Ambulatory Visit (HOSPITAL_COMMUNITY): Payer: Self-pay | Admitting: Psychiatry

## 2015-11-07 DIAGNOSIS — I10 Essential (primary) hypertension: Secondary | ICD-10-CM | POA: Diagnosis not present

## 2015-11-07 DIAGNOSIS — R079 Chest pain, unspecified: Secondary | ICD-10-CM | POA: Diagnosis not present

## 2015-11-07 DIAGNOSIS — M25519 Pain in unspecified shoulder: Secondary | ICD-10-CM | POA: Diagnosis not present

## 2015-11-07 DIAGNOSIS — Z01818 Encounter for other preprocedural examination: Secondary | ICD-10-CM | POA: Diagnosis not present

## 2015-11-19 ENCOUNTER — Ambulatory Visit: Payer: Self-pay | Admitting: Orthopedic Surgery

## 2015-11-19 DIAGNOSIS — R42 Dizziness and giddiness: Secondary | ICD-10-CM | POA: Diagnosis not present

## 2015-11-22 ENCOUNTER — Ambulatory Visit (HOSPITAL_COMMUNITY): Payer: Self-pay | Admitting: Clinical

## 2015-11-23 ENCOUNTER — Ambulatory Visit: Payer: Self-pay | Admitting: Orthopedic Surgery

## 2015-11-23 DIAGNOSIS — M50223 Other cervical disc displacement at C6-C7 level: Secondary | ICD-10-CM | POA: Diagnosis not present
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
## 2015-11-27 DIAGNOSIS — R42 Dizziness and giddiness: Secondary | ICD-10-CM | POA: Diagnosis not present

## 2015-11-30 DIAGNOSIS — Z6825 Body mass index (BMI) 25.0-25.9, adult: Secondary | ICD-10-CM | POA: Diagnosis not present

## 2015-11-30 DIAGNOSIS — Z01419 Encounter for gynecological examination (general) (routine) without abnormal findings: Secondary | ICD-10-CM | POA: Diagnosis not present

## 2015-11-30 DIAGNOSIS — R87612 Low grade squamous intraepithelial lesion on cytologic smear of cervix (LGSIL): Secondary | ICD-10-CM | POA: Diagnosis not present

## 2015-11-30 DIAGNOSIS — Z1231 Encounter for screening mammogram for malignant neoplasm of breast: Secondary | ICD-10-CM | POA: Diagnosis not present

## 2015-11-30 DIAGNOSIS — Z1151 Encounter for screening for human papillomavirus (HPV): Secondary | ICD-10-CM | POA: Diagnosis not present

## 2015-12-10 NOTE — Patient Instructions (Signed)
[name]  12/10/2015   Your procedure is scheduled on: 12/13/2015    Report to [facility] Main  Entrance take [place]  elevators to 3rd floor to  [place] at     [place] AM.  Call this number if you have problems the morning of surgery [phone]   Remember: ONLY 1 PERSON MAY GO WITH YOU TO SHORT STAY TO GET  READY MORNING OF YOUR SURGERY.  Do not eat food or drink liquids :After Midnight.     Take these medicines the morning of surgery with A SIP OF WATER:  Xanax if needed, metoprolol ( Lopressor)                                 You may not have any metal on your body including hair pins and              piercings  Do not wear jewelry, make-up, lotions, powders or perfumes, deodorant             Do not wear nail polish.  Do not shave  48 hours prior to surgery.               Do not bring valuables to the hospital. [place].  Contacts, dentures or bridgework may not be worn into surgery.  Leave suitcase in the car. After surgery it may be brought to your room.     Patients discharged the day of surgery will not be allowed to drive home.  Name and phone number of your driver:  Special Instructions: coughing and deep breathing exercises, leg exercises               Please read over the following fact sheets you were given: _____________________________________________________________________             [facility] - Preparing for Surgery Before surgery, you can play an important role.  Because skin is not sterile, your skin needs to be as free of germs as possible.  You can reduce the number of germs on your skin by washing with CHG (chlorahexidine gluconate) soap before surgery.  CHG is an antiseptic cleaner which kills germs and bonds with the skin to continue killing germs even after washing. Please DO NOT use if you have an allergy to CHG or antibacterial soaps.  If your skin becomes  reddened/irritated stop using the CHG and inform your nurse when you arrive at Short Stay. Do not shave (including legs and underarms) for at least 48 hours prior to the first CHG shower.  You may shave your face/neck. Please follow these instructions carefully:  1.  Shower with CHG Soap the night before surgery and the  morning of Surgery.  2.  If you choose to wash your hair, wash your hair first as usual with your  normal  shampoo.  3.  After you shampoo, rinse your hair and body thoroughly to remove the  shampoo.                           4.  Use CHG as you would any other liquid soap.  You can apply chg directly  to the skin  and wash                       Gently with a scrungie or clean washcloth.  5.  Apply the CHG Soap to your body ONLY FROM THE NECK DOWN.   Do not use on face/ open                           Wound or open sores. Avoid contact with eyes, ears mouth and genitals (private parts).                       Wash face,  Genitals (private parts) with your normal soap.             6.  Wash thoroughly, paying special attention to the area where your surgery  will be performed.  7.  Thoroughly rinse your body with warm water from the neck down.  8.  DO NOT shower/wash with your normal soap after using and rinsing off  the CHG Soap.                9.  Pat yourself dry with a clean towel.            10.  Wear clean pajamas.            11.  Place clean sheets on your bed the night of your first shower and do not  sleep with pets. Day of Surgery : Do not apply any lotions/deodorants the morning of surgery.  Please wear clean clothes to the hospital/surgery center.  FAILURE TO FOLLOW THESE INSTRUCTIONS MAY RESULT IN THE CANCELLATION OF YOUR SURGERY PATIENT SIGNATURE_________________________________  NURSE SIGNATURE__________________________________  ________________________________________________________________________   [name]  An incentive spirometer is a tool that  can help keep your lungs clear and active. This tool measures how well you are filling your lungs with each breath. Taking long deep breaths may help reverse or decrease the chance of developing breathing (pulmonary) problems (especially infection) following:  A long period of time when you are unable to move or be active. BEFORE THE PROCEDURE   If the spirometer includes an indicator to show your best effort, your nurse or respiratory therapist will set it to a desired goal.  If possible, sit up straight or lean slightly forward. Try not to slouch.  Hold the incentive spirometer in an upright position. INSTRUCTIONS FOR USE   Sit on the edge of your bed if possible, or sit up as far as you can in bed or on a chair.  Hold the incentive spirometer in an upright position.  Breathe out normally.  Place the mouthpiece in your mouth and seal your lips tightly around it.  Breathe in slowly and as deeply as possible, raising the piston or the ball toward the top of the column.  Hold your breath for 3-5 seconds or for as long as possible. Allow the piston or ball to fall to the bottom of the column.  Remove the mouthpiece from your mouth and breathe out normally.  Rest for a few seconds and repeat Steps 1 through 7 at least 10 times every 1-2 hours when you are awake. Take your time and take a few normal breaths between deep breaths.  The spirometer may include an indicator to show your best effort. Use the indicator as a goal to work toward during each repetition.  After each set of 10  deep breaths, practice coughing to be sure your lungs are clear. If you have an incision (the cut made at the time of surgery), support your incision when coughing by placing a pillow or rolled up towels firmly against it. Once you are able to get out of bed, walk around indoors and cough well. You may stop using the incentive spirometer when instructed by your caregiver.  RISKS AND COMPLICATIONS  Take your  time so you do not get dizzy or light-headed.  If you are in pain, you may need to take or ask for pain medication before doing incentive spirometry. It is harder to take a deep breath if you are having pain. AFTER USE  Rest and breathe slowly and easily.  It can be helpful to keep track of a log of your progress. Your caregiver can provide you with a simple table to help with this. If you are using the spirometer at home, follow these instructions: [place] IF:   You are having difficultly using the spirometer.  You have trouble using the spirometer as often as instructed.  Your pain medication is not giving enough relief while using the spirometer.  You develop fever of 100.5 F (38.1 C) or higher. SEEK IMMEDIATE MEDICAL CARE IF:   You cough up bloody sputum that had not been present before.  You develop fever of 102 F (38.9 C) or greater.  You develop worsening pain at or near the incision site. MAKE SURE YOU:   Understand these instructions.  Will watch your condition.  Will get help right away if you are not doing well or get worse. Document Released: 11/24/2006 Document Revised: 10/06/2011 Document Reviewed: 01/25/2007 ExitCare Patient Information 2014 ExitCare, [place].   ________________________________________________________________________  WHAT IS A BLOOD TRANSFUSION? Blood Transfusion Information  A transfusion is the replacement of blood or some of its parts. Blood is made up of multiple cells which provide different functions.  Red blood cells carry oxygen and are used for blood loss replacement.  White blood cells fight against infection.  Platelets control bleeding.  Plasma helps clot blood.  Other blood products are available for specialized needs, such as hemophilia or other clotting disorders. BEFORE THE TRANSFUSION  Who gives blood for transfusions?   Healthy volunteers who are fully evaluated to make sure their blood is safe. This  is blood bank blood. Transfusion therapy is the safest it has ever been in the practice of medicine. Before blood is taken from a donor, a complete history is taken to make sure that person has no history of diseases nor engages in risky social behavior (examples are intravenous drug use or sexual activity with multiple partners). The donor's travel history is screened to minimize risk of transmitting infections, such as malaria. The donated blood is tested for signs of infectious diseases, such as HIV and hepatitis. The blood is then tested to be sure it is compatible with you in order to minimize the chance of a transfusion reaction. If you or a relative donates blood, this is often done in anticipation of surgery and is not appropriate for emergency situations. It takes many days to process the donated blood. RISKS AND COMPLICATIONS Although transfusion therapy is very safe and saves many lives, the main dangers of transfusion include:   Getting an infectious disease.  Developing a transfusion reaction. This is an allergic reaction to something in the blood you were given. Every precaution is taken to prevent this. The decision to have a blood transfusion  has been considered carefully by your caregiver before blood is given. Blood is not given unless the benefits outweigh the risks. AFTER THE TRANSFUSION  Right after receiving a blood transfusion, you will usually feel much better and more energetic. This is especially true if your red blood cells have gotten low (anemic). The transfusion raises the level of the red blood cells which carry oxygen, and this usually causes an energy increase.  The nurse administering the transfusion will monitor you carefully for complications. HOME CARE INSTRUCTIONS  No special instructions are needed after a transfusion. You may find your energy is better. Speak with your caregiver about any limitations on activity for underlying diseases you may have. SEEK  MEDICAL CARE IF:   Your condition is not improving after your transfusion.  You develop redness or irritation at the intravenous (IV) site. SEEK IMMEDIATE MEDICAL CARE IF:  Any of the following symptoms occur over the next 12 hours:  Shaking chills.  You have a temperature by mouth above 102 F (38.9 C), not controlled by medicine.  Chest, back, or muscle pain.  People around you feel you are not acting correctly or are confused.  Shortness of breath or difficulty breathing.  Dizziness and fainting.  You get a rash or develop hives.  You have a decrease in urine output.  Your urine turns a dark color or changes to pink, red, or brown. Any of the following symptoms occur over the next 10 days:  You have a temperature by mouth above 102 F (38.9 C), not controlled by medicine.  Shortness of breath.  Weakness after normal activity.  The white part of the eye turns yellow (jaundice).  You have a decrease in the amount of urine or are urinating less often.  Your urine turns a dark color or changes to pink, red, or brown. Document Released: 07/11/2000 Document Revised: 10/06/2011 Document Reviewed: 02/28/2008 [facility] Patient Information 2014 [place], [place].  _______________________________________________________________________

## 2015-12-11 ENCOUNTER — Encounter (HOSPITAL_COMMUNITY)
Admission: RE | Admit: 2015-12-11 | Discharge: 2015-12-11 | Disposition: A | Discharge status: Home / Self Care | Payer: Federal, State, Local not specified - PPO | Source: Ambulatory Visit | Attending: Specialist | Admitting: Specialist

## 2015-12-11 ENCOUNTER — Encounter (HOSPITAL_COMMUNITY): Payer: Self-pay

## 2015-12-11 DIAGNOSIS — M7542 Impingement syndrome of left shoulder: Secondary | ICD-10-CM | POA: Diagnosis not present

## 2015-12-11 DIAGNOSIS — I10 Essential (primary) hypertension: Secondary | ICD-10-CM | POA: Diagnosis not present

## 2015-12-11 DIAGNOSIS — D473 Essential (hemorrhagic) thrombocythemia: Secondary | ICD-10-CM | POA: Diagnosis not present

## 2015-12-11 DIAGNOSIS — M75102 Unspecified rotator cuff tear or rupture of left shoulder, not specified as traumatic: Secondary | ICD-10-CM | POA: Diagnosis not present

## 2015-12-11 DIAGNOSIS — F419 Anxiety disorder, unspecified: Secondary | ICD-10-CM | POA: Diagnosis not present

## 2015-12-11 DIAGNOSIS — F4310 Post-traumatic stress disorder, unspecified: Secondary | ICD-10-CM | POA: Diagnosis not present

## 2015-12-11 DIAGNOSIS — R51 Headache: Secondary | ICD-10-CM | POA: Diagnosis not present

## 2015-12-11 HISTORY — DX: Headache, unspecified: R51.9

## 2015-12-11 HISTORY — DX: Cardiac murmur, unspecified: R01.1

## 2015-12-11 HISTORY — DX: Family history of other specified conditions: Z84.89

## 2015-12-11 HISTORY — DX: Headache: R51

## 2015-12-11 HISTORY — DX: Gastro-esophageal reflux disease without esophagitis: K21.9

## 2015-12-11 LAB — CBC
HCT: 38.4 % (ref 36.0–46.0)
Hemoglobin: 12.1 g/dL (ref 12.0–15.0)
MCH: 30.3 pg (ref 26.0–34.0)
MCHC: 31.5 g/dL (ref 30.0–36.0)
MCV: 96.2 fL (ref 78.0–100.0)
PLATELETS: 503 10*3/uL — AB (ref 150–400)
RBC: 3.99 MIL/uL (ref 3.87–5.11)
RDW: 19 % — AB (ref 11.5–15.5)
WBC: 7.4 10*3/uL (ref 4.0–10.5)

## 2015-12-11 LAB — BASIC METABOLIC PANEL
Anion gap: 4 — ABNORMAL LOW (ref 5–15)
BUN: 16 mg/dL (ref 6–20)
CHLORIDE: 108 mmol/L (ref 101–111)
CO2: 30 mmol/L (ref 22–32)
Calcium: 9.4 mg/dL (ref 8.9–10.3)
Creatinine, Ser: 1.09 mg/dL — ABNORMAL HIGH (ref 0.44–1.00)
GFR, EST NON AFRICAN AMERICAN: 58 mL/min — AB (ref >60)
Glucose, Bld: 101 mg/dL — ABNORMAL HIGH (ref 65–99)
POTASSIUM: 4.1 mmol/L (ref 3.5–5.1)
SODIUM: 142 mmol/L (ref 135–145)

## 2015-12-11 LAB — ABO/RH: ABO/RH(D): O POS

## 2015-12-11 NOTE — Progress Notes (Signed)
BMP done 12/11/15 faxed via EPIC to Dr Tonita Cong.

## 2015-12-11 NOTE — Progress Notes (Signed)
EKG-10/2015 on chart

## 2015-12-11 NOTE — Progress Notes (Signed)
EKG-11/07/15- on chart

## 2015-12-12 NOTE — Anesthesia Preprocedure Evaluation (Addendum)
Anesthesia Evaluation  Patient identified by MRN, date of birth, ID band Patient awake    Reviewed: Allergy & Precautions, NPO status , Patient's Chart, lab work & pertinent test results  History of Anesthesia Complications Negative for: history of anesthetic complications  Airway Mallampati: II  TM Distance: >3 FB Neck ROM: Full    Dental  (+) Teeth Intact, Dental Advisory Given   Pulmonary neg pulmonary ROS,    Pulmonary exam normal        Cardiovascular hypertension, Pt. on medications and Pt. on home beta blockers Normal cardiovascular exam     Neuro/Psych  Headaches, PSYCHIATRIC DISORDERS Anxiety Depression    GI/Hepatic Neg liver ROS, GERD  ,  Endo/Other  negative endocrine ROS  Renal/GU negative Renal ROS     Musculoskeletal   Abdominal   Peds  Hematology   Anesthesia Other Findings   Reproductive/Obstetrics                            Anesthesia Physical Anesthesia Plan  ASA: II  Anesthesia Plan: General   Post-op Pain Management: GA combined w/ Regional for post-op pain   Induction: Intravenous  Airway Management Planned: Oral ETT  Additional Equipment:   Intra-op Plan:   Post-operative Plan: Extubation in OR  Informed Consent: I have reviewed the patients History and Physical, chart, labs and discussed the procedure including the risks, benefits and alternatives for the proposed anesthesia with the patient or authorized representative who has indicated his/her understanding and acceptance.   Dental advisory given  Plan Discussed with: CRNA and Anesthesiologist  Anesthesia Plan Comments:        Anesthesia Quick Evaluation

## 2015-12-13 ENCOUNTER — Observation Stay (HOSPITAL_COMMUNITY)
Admission: RE | Admit: 2015-12-13 | Discharge: 2015-12-14 | Disposition: A | Discharge status: Home / Self Care | Payer: Federal, State, Local not specified - PPO | Source: Ambulatory Visit | Attending: Specialist | Admitting: Specialist

## 2015-12-13 ENCOUNTER — Ambulatory Visit (HOSPITAL_COMMUNITY): Payer: Federal, State, Local not specified - PPO | Admitting: Anesthesiology

## 2015-12-13 ENCOUNTER — Encounter (HOSPITAL_COMMUNITY)
Admission: RE | Disposition: A | Discharge status: Home / Self Care | Payer: Self-pay | Source: Ambulatory Visit | Attending: Specialist

## 2015-12-13 ENCOUNTER — Encounter (HOSPITAL_COMMUNITY): Payer: Self-pay | Admitting: *Deleted

## 2015-12-13 DIAGNOSIS — M75102 Unspecified rotator cuff tear or rupture of left shoulder, not specified as traumatic: Principal | ICD-10-CM | POA: Insufficient documentation

## 2015-12-13 DIAGNOSIS — F4310 Post-traumatic stress disorder, unspecified: Secondary | ICD-10-CM | POA: Diagnosis not present

## 2015-12-13 DIAGNOSIS — F419 Anxiety disorder, unspecified: Secondary | ICD-10-CM | POA: Insufficient documentation

## 2015-12-13 DIAGNOSIS — G8918 Other acute postprocedural pain: Secondary | ICD-10-CM | POA: Diagnosis not present

## 2015-12-13 DIAGNOSIS — M7542 Impingement syndrome of left shoulder: Secondary | ICD-10-CM | POA: Diagnosis not present

## 2015-12-13 DIAGNOSIS — M25512 Pain in left shoulder: Secondary | ICD-10-CM | POA: Diagnosis not present

## 2015-12-13 DIAGNOSIS — D473 Essential (hemorrhagic) thrombocythemia: Secondary | ICD-10-CM | POA: Insufficient documentation

## 2015-12-13 DIAGNOSIS — M75100 Unspecified rotator cuff tear or rupture of unspecified shoulder, not specified as traumatic: Secondary | ICD-10-CM | POA: Diagnosis present

## 2015-12-13 DIAGNOSIS — R51 Headache: Secondary | ICD-10-CM | POA: Insufficient documentation

## 2015-12-13 DIAGNOSIS — I10 Essential (primary) hypertension: Secondary | ICD-10-CM | POA: Diagnosis not present

## 2015-12-13 DIAGNOSIS — S46012D Strain of muscle(s) and tendon(s) of the rotator cuff of left shoulder, subsequent encounter: Secondary | ICD-10-CM | POA: Diagnosis not present

## 2015-12-13 HISTORY — PX: SHOULDER ARTHROSCOPY WITH SUBACROMIAL DECOMPRESSION AND OPEN ROTATOR C: SHX5688

## 2015-12-13 LAB — CBC
HCT: 33.3 % — ABNORMAL LOW (ref 36.0–46.0)
HEMOGLOBIN: 10.7 g/dL — AB (ref 12.0–15.0)
MCH: 31.1 pg (ref 26.0–34.0)
MCHC: 32.1 g/dL (ref 30.0–36.0)
MCV: 96.8 fL (ref 78.0–100.0)
PLATELETS: 366 10*3/uL (ref 150–400)
RBC: 3.44 MIL/uL — ABNORMAL LOW (ref 3.87–5.11)
RDW: 19.3 % — AB (ref 11.5–15.5)
WBC: 7.4 10*3/uL (ref 4.0–10.5)

## 2015-12-13 LAB — TYPE AND SCREEN
ABO/RH(D): O POS
Antibody Screen: NEGATIVE

## 2015-12-13 LAB — CREATININE, SERUM
CREATININE: 1.09 mg/dL — AB (ref 0.44–1.00)
GFR calc Af Amer: >60 mL/min (ref >60)
GFR calc non Af Amer: 58 mL/min — ABNORMAL LOW (ref >60)

## 2015-12-13 SURGERY — SHOULDER ARTHROSCOPY WITH SUBACROMIAL DECOMPRESSION AND OPEN ROTATOR CUFF REPAIR, OPEN BICEPS TENDON REPAIR
Anesthesia: General | Site: Shoulder | Laterality: Left

## 2015-12-13 MED ORDER — LOSARTAN POTASSIUM 50 MG PO TABS
100.0000 mg | ORAL_TABLET | Freq: Every day | ORAL | Status: DC
  Administered 2015-12-13 – 2015-12-14 (×2): 100 mg via ORAL
  Filled 2015-12-13 (×2): qty 2

## 2015-12-13 MED ORDER — ONDANSETRON HCL 4 MG PO TABS: 4.0000 mg | ORAL_TABLET | Freq: Four times a day (QID) | ORAL | Status: DC | PRN

## 2015-12-13 MED ORDER — ALPRAZOLAM 0.25 MG PO TABS: 0.2500 mg | ORAL_TABLET | Freq: Every day | ORAL | Status: DC | PRN

## 2015-12-13 MED ORDER — HYDROMORPHONE HCL 1 MG/ML IJ SOLN: 0.2500 mg | INTRAMUSCULAR | Status: DC | PRN

## 2015-12-13 MED ORDER — CEFAZOLIN SODIUM-DEXTROSE 2-4 GM/100ML-% IV SOLN
2.0000 g | Freq: Three times a day (TID) | INTRAVENOUS | Status: AC
  Administered 2015-12-13 (×2): 2 g via INTRAVENOUS
  Filled 2015-12-13 (×4): qty 100

## 2015-12-13 MED ORDER — METHOCARBAMOL 500 MG PO TABS: 500.0000 mg | ORAL_TABLET | Freq: Four times a day (QID) | ORAL | Status: DC | PRN

## 2015-12-13 MED ORDER — HYDROMORPHONE HCL 1 MG/ML IJ SOLN: 0.5000 mg | INTRAMUSCULAR | Status: DC | PRN

## 2015-12-13 MED ORDER — MAGNESIUM CITRATE PO SOLN: 1.0000 | Freq: Once | ORAL | Status: DC | PRN

## 2015-12-13 MED ORDER — POLYETHYLENE GLYCOL 3350 17 G PO PACK: 17.0000 g | PACK | Freq: Every day | ORAL | Status: DC | PRN

## 2015-12-13 MED ORDER — BUPIVACAINE-EPINEPHRINE (PF) 0.5% -1:200000 IJ SOLN
INTRAMUSCULAR | Status: AC
  Filled 2015-12-13: qty 30

## 2015-12-13 MED ORDER — SCOPOLAMINE 1 MG/3DAYS TD PT72
1.0000 | MEDICATED_PATCH | TRANSDERMAL | Status: DC
  Administered 2015-12-13: 1.5 mg via TRANSDERMAL
  Filled 2015-12-13: qty 1

## 2015-12-13 MED ORDER — ACETAMINOPHEN 650 MG RE SUPP: 650.0000 mg | Freq: Four times a day (QID) | RECTAL | Status: DC | PRN

## 2015-12-13 MED ORDER — OXYCODONE HCL 5 MG PO TABS
5.0000 mg | ORAL_TABLET | ORAL | Status: DC | PRN
  Administered 2015-12-14: 5 mg via ORAL
  Administered 2015-12-14 (×3): 10 mg via ORAL
  Filled 2015-12-13 (×2): qty 2
  Filled 2015-12-13: qty 1
  Filled 2015-12-13: qty 2

## 2015-12-13 MED ORDER — FENTANYL CITRATE (PF) 250 MCG/5ML IJ SOLN
INTRAMUSCULAR | Status: AC
  Filled 2015-12-13: qty 5

## 2015-12-13 MED ORDER — PHENYLEPHRINE 40 MCG/ML (10ML) SYRINGE FOR IV PUSH (FOR BLOOD PRESSURE SUPPORT)
PREFILLED_SYRINGE | INTRAVENOUS | Status: AC
  Filled 2015-12-13: qty 10

## 2015-12-13 MED ORDER — ANAGRELIDE HCL 0.5 MG PO CAPS
0.5000 mg | ORAL_CAPSULE | Freq: Every day | ORAL | Status: DC
  Administered 2015-12-13 – 2015-12-14 (×2): 0.5 mg via ORAL
  Filled 2015-12-13 (×2): qty 1

## 2015-12-13 MED ORDER — METHOCARBAMOL 500 MG PO TABS
500.0000 mg | ORAL_TABLET | Freq: Four times a day (QID) | ORAL | Status: DC | PRN
Start: 2015-12-13 — End: 2015-12-14
  Filled 2015-12-13: qty 1

## 2015-12-13 MED ORDER — BISACODYL 5 MG PO TBEC: 5.0000 mg | DELAYED_RELEASE_TABLET | Freq: Every day | ORAL | Status: DC | PRN

## 2015-12-13 MED ORDER — LIDOCAINE HCL (CARDIAC) 20 MG/ML IV SOLN
INTRAVENOUS | Status: AC
  Filled 2015-12-13: qty 5

## 2015-12-13 MED ORDER — DEXAMETHASONE SODIUM PHOSPHATE 10 MG/ML IJ SOLN
INTRAMUSCULAR | Status: DC | PRN
  Administered 2015-12-13: 5 mg via INTRAVENOUS

## 2015-12-13 MED ORDER — DOCUSATE SODIUM 100 MG PO CAPS: 100.0000 mg | ORAL_CAPSULE | Freq: Two times a day (BID) | ORAL | Status: DC | PRN

## 2015-12-13 MED ORDER — OXYCODONE-ACETAMINOPHEN 5-325 MG PO TABS: 1.0000 | ORAL_TABLET | ORAL | Status: DC | PRN

## 2015-12-13 MED ORDER — ENOXAPARIN SODIUM 30 MG/0.3ML ~~LOC~~ SOLN
30.0000 mg | SUBCUTANEOUS | Status: DC
  Administered 2015-12-14: 30 mg via SUBCUTANEOUS
  Filled 2015-12-13: qty 0.3

## 2015-12-13 MED ORDER — EPINEPHRINE HCL 1 MG/ML IJ SOLN
INTRAMUSCULAR | Status: AC
  Filled 2015-12-13: qty 2

## 2015-12-13 MED ORDER — HYDROCHLOROTHIAZIDE 25 MG PO TABS
25.0000 mg | ORAL_TABLET | Freq: Every day | ORAL | Status: DC
  Administered 2015-12-14: 25 mg via ORAL
  Filled 2015-12-13: qty 1

## 2015-12-13 MED ORDER — PHENYLEPHRINE HCL 10 MG/ML IJ SOLN
INTRAMUSCULAR | Status: DC | PRN
  Administered 2015-12-13 (×5): 80 ug via INTRAVENOUS

## 2015-12-13 MED ORDER — ZOLPIDEM TARTRATE 5 MG PO TABS
5.0000 mg | ORAL_TABLET | Freq: Every evening | ORAL | Status: DC | PRN
Start: 2015-12-13 — End: 2015-12-14

## 2015-12-13 MED ORDER — CEFAZOLIN SODIUM-DEXTROSE 2-4 GM/100ML-% IV SOLN
2.0000 g | Freq: Three times a day (TID) | INTRAVENOUS | Status: DC
  Filled 2015-12-13 (×2): qty 100

## 2015-12-13 MED ORDER — EPHEDRINE SULFATE 50 MG/ML IJ SOLN
INTRAMUSCULAR | Status: DC | PRN
  Administered 2015-12-13 (×2): 5 mg via INTRAVENOUS

## 2015-12-13 MED ORDER — LACTATED RINGERS IV SOLN
INTRAVENOUS | Status: DC
  Administered 2015-12-13 (×2): via INTRAVENOUS

## 2015-12-13 MED ORDER — IBUPROFEN 800 MG PO TABS: 800.0000 mg | ORAL_TABLET | Freq: Three times a day (TID) | ORAL | Status: DC | PRN

## 2015-12-13 MED ORDER — ONDANSETRON HCL 4 MG/2ML IJ SOLN
INTRAMUSCULAR | Status: AC
  Filled 2015-12-13: qty 4

## 2015-12-13 MED ORDER — POTASSIUM CHLORIDE IN NACL 20-0.45 MEQ/L-% IV SOLN
INTRAVENOUS | Status: DC
  Administered 2015-12-13: 11:00:00 via INTRAVENOUS
  Filled 2015-12-13 (×2): qty 1000

## 2015-12-13 MED ORDER — FENTANYL CITRATE (PF) 100 MCG/2ML IJ SOLN
INTRAMUSCULAR | Status: DC | PRN
  Administered 2015-12-13 (×2): 50 ug via INTRAVENOUS

## 2015-12-13 MED ORDER — PROPOFOL 10 MG/ML IV BOLUS
INTRAVENOUS | Status: DC | PRN
  Administered 2015-12-13: 180 mg via INTRAVENOUS

## 2015-12-13 MED ORDER — EPHEDRINE SULFATE 50 MG/ML IJ SOLN
INTRAMUSCULAR | Status: AC
  Filled 2015-12-13: qty 1

## 2015-12-13 MED ORDER — SUGAMMADEX SODIUM 200 MG/2ML IV SOLN
INTRAVENOUS | Status: AC
  Filled 2015-12-13: qty 2

## 2015-12-13 MED ORDER — DEXTROSE 5 % IV SOLN
500.0000 mg | Freq: Four times a day (QID) | INTRAVENOUS | Status: DC | PRN
  Administered 2015-12-13: 500 mg via INTRAVENOUS
  Filled 2015-12-13: qty 5
  Filled 2015-12-13: qty 550

## 2015-12-13 MED ORDER — MIDAZOLAM HCL 5 MG/5ML IJ SOLN
INTRAMUSCULAR | Status: DC | PRN
  Administered 2015-12-13: 2 mg via INTRAVENOUS

## 2015-12-13 MED ORDER — ONDANSETRON HCL 4 MG/2ML IJ SOLN: 4.0000 mg | Freq: Four times a day (QID) | INTRAMUSCULAR | Status: DC | PRN

## 2015-12-13 MED ORDER — LOSARTAN POTASSIUM-HCTZ 100-25 MG PO TABS: 1.0000 | ORAL_TABLET | Freq: Every day | ORAL | Status: DC

## 2015-12-13 MED ORDER — PHENOL 1.4 % MT LIQD
1.0000 | OROMUCOSAL | Status: DC | PRN
  Filled 2015-12-13: qty 177

## 2015-12-13 MED ORDER — DIPHENHYDRAMINE HCL 12.5 MG/5ML PO ELIX: 12.5000 mg | ORAL_SOLUTION | ORAL | Status: DC | PRN

## 2015-12-13 MED ORDER — SODIUM CHLORIDE 0.9 % IJ SOLN
INTRAMUSCULAR | Status: AC
  Filled 2015-12-13: qty 10

## 2015-12-13 MED ORDER — EPINEPHRINE HCL 1 MG/ML IJ SOLN
INTRAMUSCULAR | Status: DC | PRN
  Administered 2015-12-13: 1 mg via INTRAMUSCULAR

## 2015-12-13 MED ORDER — PROPOFOL 10 MG/ML IV BOLUS
INTRAVENOUS | Status: AC
  Filled 2015-12-13: qty 20

## 2015-12-13 MED ORDER — METOPROLOL TARTRATE 50 MG PO TABS
50.0000 mg | ORAL_TABLET | Freq: Two times a day (BID) | ORAL | Status: DC
  Administered 2015-12-13 – 2015-12-14 (×2): 50 mg via ORAL
  Filled 2015-12-13 (×2): qty 1

## 2015-12-13 MED ORDER — ALUM & MAG HYDROXIDE-SIMETH 200-200-20 MG/5ML PO SUSP: 30.0000 mL | ORAL | Status: DC | PRN

## 2015-12-13 MED ORDER — CEFAZOLIN SODIUM-DEXTROSE 2-4 GM/100ML-% IV SOLN
INTRAVENOUS | Status: AC
  Filled 2015-12-13: qty 100

## 2015-12-13 MED ORDER — BUPIVACAINE-EPINEPHRINE 0.5% -1:200000 IJ SOLN
INTRAMUSCULAR | Status: DC | PRN
  Administered 2015-12-13: 15 mL

## 2015-12-13 MED ORDER — CEFAZOLIN SODIUM-DEXTROSE 2-4 GM/100ML-% IV SOLN
2.0000 g | INTRAVENOUS | Status: AC
  Administered 2015-12-13: 2 g via INTRAVENOUS

## 2015-12-13 MED ORDER — ROCURONIUM BROMIDE 100 MG/10ML IV SOLN
INTRAVENOUS | Status: DC | PRN
  Administered 2015-12-13: 50 mg via INTRAVENOUS

## 2015-12-13 MED ORDER — METOCLOPRAMIDE HCL 5 MG/ML IJ SOLN: 5.0000 mg | Freq: Three times a day (TID) | INTRAMUSCULAR | Status: DC | PRN

## 2015-12-13 MED ORDER — SUGAMMADEX SODIUM 200 MG/2ML IV SOLN
INTRAVENOUS | Status: DC | PRN
  Administered 2015-12-13: 200 mg via INTRAVENOUS

## 2015-12-13 MED ORDER — METOCLOPRAMIDE HCL 5 MG PO TABS: 5.0000 mg | ORAL_TABLET | Freq: Three times a day (TID) | ORAL | Status: DC | PRN

## 2015-12-13 MED ORDER — SODIUM CHLORIDE 0.9 % IR SOLN
Status: AC
  Filled 2015-12-13: qty 1

## 2015-12-13 MED ORDER — MIDAZOLAM HCL 2 MG/2ML IJ SOLN
INTRAMUSCULAR | Status: AC
  Filled 2015-12-13: qty 2

## 2015-12-13 MED ORDER — ACETAMINOPHEN 325 MG PO TABS: 650.0000 mg | ORAL_TABLET | Freq: Four times a day (QID) | ORAL | Status: DC | PRN

## 2015-12-13 MED ORDER — BUPIVACAINE-EPINEPHRINE (PF) 0.5% -1:200000 IJ SOLN
INTRAMUSCULAR | Status: DC | PRN
Start: 2015-12-13 — End: 2015-12-13
  Administered 2015-12-13: 30 mL via PERINEURAL

## 2015-12-13 MED ORDER — MENTHOL 3 MG MT LOZG
1.0000 | LOZENGE | OROMUCOSAL | Status: DC | PRN
Start: 2015-12-13 — End: 2015-12-14
  Filled 2015-12-13: qty 9

## 2015-12-13 MED ORDER — LACTATED RINGERS IR SOLN
Status: DC | PRN
  Administered 2015-12-13: 6000 mL

## 2015-12-13 MED ORDER — DEXAMETHASONE SODIUM PHOSPHATE 10 MG/ML IJ SOLN
INTRAMUSCULAR | Status: AC
  Filled 2015-12-13: qty 1

## 2015-12-13 MED ORDER — ONDANSETRON HCL 4 MG/2ML IJ SOLN
INTRAMUSCULAR | Status: DC | PRN
  Administered 2015-12-13 (×4): 2 mg via INTRAVENOUS

## 2015-12-13 MED ORDER — DOCUSATE SODIUM 100 MG PO CAPS
100.0000 mg | ORAL_CAPSULE | Freq: Two times a day (BID) | ORAL | Status: DC
  Administered 2015-12-13 – 2015-12-14 (×3): 100 mg via ORAL
  Filled 2015-12-13 (×3): qty 1

## 2015-12-13 MED ORDER — MECLIZINE HCL 25 MG PO TABS
25.0000 mg | ORAL_TABLET | Freq: Three times a day (TID) | ORAL | Status: DC | PRN
  Filled 2015-12-13: qty 1

## 2015-12-13 SURGICAL SUPPLY — 55 items
ANCHOR NEEDLE 9/16 CIR SZ 8 (NEEDLE) IMPLANT
BLADE 15 SAFETY STRL DISP (BLADE) ×2 IMPLANT
BLADE CUDA SHAVER 3.5 (BLADE) ×2 IMPLANT
BLADE OSCILLATING/SAGITTAL (BLADE) ×2
BLADE SURG SZ11 CARB STEEL (BLADE) ×2 IMPLANT
BLADE SW THK.38XMED LNG THN (BLADE) ×1 IMPLANT
BUR OVAL 4.0 (BURR) IMPLANT
CANNULA ACUFO 5X76 (CANNULA) ×2 IMPLANT
CHLORAPREP W/TINT 26ML (MISCELLANEOUS) IMPLANT
CLOTH 2% CHLOROHEXIDINE 3PK (PERSONAL CARE ITEMS) ×2 IMPLANT
DRAPE ORTHO SPLIT 77X108 STRL (DRAPES)
DRAPE POUCH INSTRU U-SHP 10X18 (DRAPES) ×2 IMPLANT
DRAPE STERI 35X30 U-POUCH (DRAPES) ×2 IMPLANT
DRAPE SURG ORHT 6 SPLT 77X108 (DRAPES) IMPLANT
DRSG AQUACEL AG ADV 3.5X 4 (GAUZE/BANDAGES/DRESSINGS) IMPLANT
DRSG AQUACEL AG ADV 3.5X 6 (GAUZE/BANDAGES/DRESSINGS) ×2 IMPLANT
DRSG PAD ABDOMINAL 8X10 ST (GAUZE/BANDAGES/DRESSINGS) IMPLANT
DURAPREP 26ML APPLICATOR (WOUND CARE) ×2 IMPLANT
ELECT NEEDLE TIP 2.8 STRL (NEEDLE) ×2 IMPLANT
ELECT REM PT RETURN 9FT ADLT (ELECTROSURGICAL) ×2
ELECTRODE REM PT RTRN 9FT ADLT (ELECTROSURGICAL) ×1 IMPLANT
GLOVE BIOGEL PI IND STRL 7.0 (GLOVE) ×1 IMPLANT
GLOVE BIOGEL PI INDICATOR 7.0 (GLOVE) ×1
GLOVE SURG SS PI 7.0 STRL IVOR (GLOVE) ×2 IMPLANT
GLOVE SURG SS PI 7.5 STRL IVOR (GLOVE) ×2 IMPLANT
GLOVE SURG SS PI 8.0 STRL IVOR (GLOVE) ×4 IMPLANT
GOWN STRL REUS W/TWL XL LVL3 (GOWN DISPOSABLE) ×4 IMPLANT
KIT BASIN OR (CUSTOM PROCEDURE TRAY) ×2 IMPLANT
KIT POSITION SHOULDER SCHLEI (MISCELLANEOUS) ×2 IMPLANT
MANIFOLD NEPTUNE II (INSTRUMENTS) ×2 IMPLANT
NEEDLE SCORPION MULTI FIRE (NEEDLE) IMPLANT
NEEDLE SPNL 18GX3.5 QUINCKE PK (NEEDLE) ×2 IMPLANT
PACK SHOULDER (CUSTOM PROCEDURE TRAY) ×2 IMPLANT
POSITIONER SURGICAL ARM (MISCELLANEOUS) ×2 IMPLANT
SLING ARM IMMOBILIZER LRG (SOFTGOODS) IMPLANT
SLING ARM IMMOBILIZER MED (SOFTGOODS) ×2 IMPLANT
STRIP CLOSURE SKIN 1/2X4 (GAUZE/BANDAGES/DRESSINGS) ×2 IMPLANT
SUCTION FRAZIER HANDLE 10FR (MISCELLANEOUS) ×1
SUCTION TUBE FRAZIER 10FR DISP (MISCELLANEOUS) ×1 IMPLANT
SUT ETHIBOND NAB CT1 #1 30IN (SUTURE) ×2 IMPLANT
SUT ETHILON 4 0 PS 2 18 (SUTURE) ×2 IMPLANT
SUT FIBERWIRE #2 38 T-5 BLUE (SUTURE)
SUT PROLENE 1 CT 1 30 (SUTURE) ×2 IMPLANT
SUT PROLENE 3 0 PS 2 (SUTURE) ×2 IMPLANT
SUT TIGER TAPE 7 IN WHITE (SUTURE) IMPLANT
SUT VIC AB 1-0 CT2 27 (SUTURE) IMPLANT
SUT VIC AB 2-0 CT2 27 (SUTURE) IMPLANT
SUT VICRYL 0-0 OS 2 NEEDLE (SUTURE) ×2 IMPLANT
SUTURE FIBERWR #2 38 T-5 BLUE (SUTURE) IMPLANT
TAPE FIBER 2MM 7IN #2 BLUE (SUTURE) IMPLANT
TOWEL OR 17X26 10 PK STRL BLUE (TOWEL DISPOSABLE) ×2 IMPLANT
TOWEL OR NON WOVEN STRL DISP B (DISPOSABLE) ×2 IMPLANT
TUBING ARTHRO INFLOW-ONLY STRL (TUBING) ×2 IMPLANT
TUBING CONNECTING 10 (TUBING) ×2 IMPLANT
WAND HAND CNTRL MULTIVAC 90 (MISCELLANEOUS) IMPLANT
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
## 2015-12-13 NOTE — Transfer of Care (Signed)
Immediate Anesthesia Transfer of Care Note  Patient: [name]  Procedure(s) Performed: Procedure(s) with comments: LEFT SHOULDER ARTHROSCOPY WITH SUBACROMIAL DECOMPRESSION AND MINI ROTATOR CUFF REPAIR (Left) - block  Patient Location: PACU  Anesthesia Type:General  Level of Consciousness:  sedated, patient cooperative and responds to stimulation  Airway & Oxygen Therapy:Patient Spontanous Breathing and Patient connected to face mask oxgen  Post-op Assessment:  Report given to PACU RN and Post -op Vital signs reviewed and stable  Post vital signs:  Reviewed and stable  Last Vitals:  Filed Vitals:   12/13/15 0519  BP: 138/93  Pulse: 52  Temp: 36.7 C  Resp: 16    Complications: No apparent anesthesia complications

## 2015-12-13 NOTE — Anesthesia Procedure Notes (Addendum)
Anesthesia Regional Block:  Interscalene brachial plexus block  Pre-Anesthetic Checklist: ,, timeout performed, Correct Patient, Correct Site, Correct Laterality, Correct Procedure, Correct Position, site marked, Risks and benefits discussed,  Surgical consent,  Pre-op evaluation,  At surgeon's request and post-op pain management  Laterality: Left  Prep: chloraprep       Needles:  Injection technique: Single-shot  Needle Type: Echogenic Stimulator Needle     Needle Length: 5cm 5 cm Needle Gauge: 22 and 22 G    Additional Needles:  Procedures: ultrasound guided (picture in chart) and nerve stimulator Interscalene brachial plexus block  Nerve Stimulator or Paresthesia:  Response: bicep contraction, 0.45 mA,   Additional Responses:   Narrative:  Start time: 12/13/2015 7:03 AM End time: 12/13/2015 7:13 AM Injection made incrementally with aspirations every 5 mL.  Performed by: Personally  Anesthesiologist: Duane Boston  Additional Notes: Functioning IV was confirmed and monitors applied.  A 24mm 22ga echogenic arrow stimulator was used. Sterile prep and drape,hand hygiene and sterile gloves were used.Ultrasound guidance: relevant anatomy identified, needle position confirmed, local anesthetic spread visualized around nerve(s)., vascular puncture avoided.  Image printed for medical record.  Negative aspiration and negative test dose prior to incremental administration of local anesthetic. The patient tolerated the procedure well.   Procedure Name: Intubation Date/Time: 12/13/2015 8:08 AM Performed by: Freddie Breech Pre-anesthesia Checklist: Patient identified, Suction available, Emergency Drugs available, Patient being monitored and Timeout performed Patient Re-evaluated:Patient Re-evaluated prior to inductionOxygen Delivery Method: Circle system utilized Preoxygenation: Pre-oxygenation with 100% oxygen Intubation Type: IV induction Ventilation: Mask ventilation  without difficulty and Oral airway inserted - appropriate to patient size Laryngoscope Size: Mac and 3 Grade View: Grade I Tube type: Oral Tube size: 7.0 mm Number of attempts: 1 Airway Equipment and Method: Patient positioned with wedge pillow and Stylet Placement Confirmation: ETT inserted through vocal cords under direct vision,  positive ETCO2,  CO2 detector and breath sounds checked- equal and bilateral Secured at: 22 cm Tube secured with: Tape Dental Injury: Teeth and Oropharynx as per pre-operative assessment

## 2015-12-13 NOTE — Brief Op Note (Signed)
12/13/2015  8:23 AM  PATIENT:  [name]  50 y.o. female  PRE-OPERATIVE DIAGNOSIS:  LEFT ROTATOR CUFF TEAR   POST-OPERATIVE DIAGNOSIS:  LEFT ROTATOR CUFF TEAR   PROCEDURE:  Procedure(s) with comments: LEFT SHOULDER ARTHROSCOPY WITH SUBACROMIAL DECOMPRESSION AND MINI ROTATOR CUFF REPAIR (Left) - block  SURGEON:  Surgeon(s) and Role:    * Susa Day, MD - Primary  PHYSICIAN ASSISTANT:   ASSISTANTS: Bissell   ANESTHESIA:   general  EBL:  Total I/O In: 1000 [I.V.:1000] Out: -   BLOOD ADMINISTERED:none  DRAINS: none   LOCAL MEDICATIONS USED:  MARCAINE     SPECIMEN:  No Specimen  DISPOSITION OF SPECIMEN:  N/A  COUNTS:  YES  TOURNIQUET:  * No tourniquets in log *  DICTATION: .Other Dictation: Dictation Number [id number]  PLAN OF CARE: Discharge to home after PACU  PATIENT DISPOSITION:  PACU - hemodynamically stable.   Delay start of Pharmacological VTE agent (>24hrs) due to surgical blood loss or risk of bleeding: no

## 2015-12-13 NOTE — Interval H&P Note (Signed)
History and Physical Interval Note:  12/13/2015 6:44 AM  [name]  has presented today for surgery, with the diagnosis of LEFT ROTATOR CUFF TEAR   The various methods of treatment have been discussed with the patient and family. After consideration of risks, benefits and other options for treatment, the patient has consented to  Procedure(s): LEFT SHOULDER ARTHROSCOPY WITH SUBACROMIAL DECOMPRESSION AND MINI ROTATOR CUFF REPAIR (Left) as a surgical intervention .  The patient's history has been reviewed, patient examined, no change in status, stable for surgery.  I have reviewed the patient's chart and labs.  Questions were answered to the patient's satisfaction.     [name] C

## 2015-12-13 NOTE — Anesthesia Postprocedure Evaluation (Signed)
Anesthesia Post Note  Patient: [name]  Procedure(s) Performed: Procedure(s) (LRB): LEFT SHOULDER ARTHROSCOPY WITH SUBACROMIAL DECOMPRESSION AND MINI ROTATOR CUFF REPAIR (Left)  Patient location during evaluation: PACU Anesthesia Type: General Level of consciousness: sedated Pain management: pain level controlled Vital Signs Assessment: post-procedure vital signs reviewed and stable Respiratory status: spontaneous breathing and respiratory function stable Cardiovascular status: stable Anesthetic complications: no    Last Vitals:  Filed Vitals:   12/13/15 1143 12/13/15 1240  BP: 125/77 119/61  Pulse: 61 55  Temp: 36.8 C 36.7 C  Resp: 16 18    Last Pain:  Filed Vitals:   12/13/15 1253  PainSc: 0-No pain                 Kanen Mottola DANIEL

## 2015-12-13 NOTE — Discharge Instructions (Signed)

## 2015-12-14 DIAGNOSIS — M75102 Unspecified rotator cuff tear or rupture of left shoulder, not specified as traumatic: Secondary | ICD-10-CM | POA: Diagnosis not present

## 2015-12-14 DIAGNOSIS — F4310 Post-traumatic stress disorder, unspecified: Secondary | ICD-10-CM | POA: Diagnosis not present

## 2015-12-14 DIAGNOSIS — M7542 Impingement syndrome of left shoulder: Secondary | ICD-10-CM | POA: Diagnosis not present

## 2015-12-14 DIAGNOSIS — R51 Headache: Secondary | ICD-10-CM | POA: Diagnosis not present

## 2015-12-14 DIAGNOSIS — I10 Essential (primary) hypertension: Secondary | ICD-10-CM | POA: Diagnosis not present

## 2015-12-14 DIAGNOSIS — F419 Anxiety disorder, unspecified: Secondary | ICD-10-CM | POA: Diagnosis not present

## 2015-12-14 DIAGNOSIS — D473 Essential (hemorrhagic) thrombocythemia: Secondary | ICD-10-CM | POA: Diagnosis not present

## 2015-12-14 MED ORDER — KETOROLAC TROMETHAMINE 15 MG/ML IJ SOLN
15.0000 mg | Freq: Once | INTRAMUSCULAR | Status: AC
  Administered 2015-12-14: 15 mg via INTRAVENOUS
  Filled 2015-12-14: qty 1
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
## 2015-12-14 NOTE — Progress Notes (Addendum)
Subjective: 1 Day Post-Op Procedure(s) (LRB): LEFT SHOULDER ARTHROSCOPY WITH SUBACROMIAL DECOMPRESSION AND MINI ROTATOR CUFF REPAIR (Left) Patient reports pain as moderate c/o headache and shoulder pain this AM. No numbness or tingling.  Objective: Vital signs in last 24 hours: Temp:  [97.5 F (36.4 C)-99.5 F (37.5 C)] 99.5 F (37.5 C) (05/19 0615) Pulse Rate:  [5-76] 5 (05/19 0615) Resp:  [14-20] 18 (05/19 0615) BP: (112-176)/(61-100) 153/88 mmHg (05/19 0615) SpO2:  [96 %-100 %] 97 % (05/19 0615)  Intake/Output from previous day: 05/18 0701 - 05/19 0700 In: 2640 [P.O.:840; I.V.:1800] Out: 51 [Urine:1; Blood:50] Intake/Output this shift:     Recent Labs  12/11/15 1100 12/13/15 1102  HGB 12.1 10.7*    Recent Labs  12/11/15 1100 12/13/15 1102  WBC 7.4 7.4  RBC 3.99 3.44*  HCT 38.4 33.3*  PLT 503* 366    Recent Labs  12/11/15 1100 12/13/15 1102  NA 142  --   K 4.1  --   CL 108  --   CO2 30  --   BUN 16  --   CREATININE 1.09* 1.09*  GLUCOSE 101*  --   CALCIUM 9.4  --    No results for input(s): LABPT, INR in the last 72 hours.  Neurologically intact ABD soft Neurovascular intact Sensation intact distally Intact pulses distally Dorsiflexion/Plantar flexion intact Incision: dressing C/D/I and no drainage No cellulitis present Compartment soft no calf pain or sign of DVT  Assessment/Plan: 1 Day Post-Op Procedure(s) (LRB): LEFT SHOULDER ARTHROSCOPY WITH SUBACROMIAL DECOMPRESSION AND MINI ROTATOR CUFF REPAIR (Left) Advance diet Up with therapy D/C IV fluids Dose of toradol here, ibuprofen for home D/C home today Discussed D/C instructions Has been seen by OT  Arthella Headings M. 12/14/2015, 7:52 AM

## 2015-12-14 NOTE — Discharge Summary (Signed)
Patient ID: [name] MRN: [id number] DOB/AGE: Oct 21, 1965 50 y.o.  Admit date: 12/13/2015 Discharge date: 12/14/2015  Admission Diagnoses:  Active Problems:   Rotator cuff tear   Discharge Diagnoses:  Same  Past Medical History  Diagnosis Date  . Essential thrombocythemia ([place])   . Depression   . Anxiety   . PTSD (post-traumatic stress disorder)   . Hypertension 11/10/14  . Family history of adverse reaction to anesthesia     mother- slow to wake up   . Heart murmur     20 years ago   . GERD (gastroesophageal reflux disease)     hx of   . Headache     Surgeries: Procedure(s): LEFT SHOULDER ARTHROSCOPY WITH SUBACROMIAL DECOMPRESSION AND MINI ROTATOR CUFF REPAIR on 12/13/2015   Consultants:    Discharged Condition: Improved  Hospital Course: [name] is an 50 y.o. female who was admitted 12/13/2015 for operative treatment ofleft shoulder rotator cuff tear. Patient has severe unremitting pain that affects sleep, daily activities, and work/hobbies. After pre-op clearance the patient was taken to the operating room on 12/13/2015 and underwent  Procedure(s): LEFT SHOULDER ARTHROSCOPY WITH SUBACROMIAL DECOMPRESSION AND MINI ROTATOR CUFF REPAIR.    Patient was given perioperative antibiotics: Anti-infectives    Start     Dose/Rate Route Frequency Ordered Stop   12/13/15 1400  ceFAZolin (ANCEF) IVPB 2g/100 mL premix     2 g 200 mL/hr over 30 Minutes Intravenous Every 8 hours 12/13/15 0928 12/13/15 2337   12/13/15 1000  ceFAZolin (ANCEF) IVPB 2g/100 mL premix  Status:  Discontinued     2 g 200 mL/hr over 30 Minutes Intravenous Every 8 hours 12/13/15 0841 12/13/15 0928   12/13/15 0518  ceFAZolin (ANCEF) IVPB 2g/100 mL premix     2 g 200 mL/hr over 30 Minutes Intravenous On call to O.R. 12/13/15 0518 12/13/15 [id number]       Patient was given sequential compression devices, early ambulation, and chemoprophylaxis to prevent DVT.  Patient benefited maximally from hospital  stay and there were no complications.    Recent vital signs: Patient Vitals for the past 24 hrs:  BP Temp Temp src Pulse Resp SpO2  12/14/15 0615 (!) 153/88 mmHg 99.5 F (37.5 C) Oral (!) 5 18 97 %  12/14/15 0158 119/77 mmHg 98.5 F (36.9 C) Oral (!) 55 18 99 %  12/13/15 2104 117/72 mmHg 98.5 F (36.9 C) Oral 67 18 98 %  12/13/15 1915 116/68 mmHg 98.3 F (36.8 C) Oral 69 18 100 %  12/13/15 1407 112/62 mmHg 98.2 F (36.8 C) Oral (!) 56 18 96 %  12/13/15 1240 119/61 mmHg 98.1 F (36.7 C) Oral (!) 55 18 100 %  12/13/15 1143 125/77 mmHg 98.2 F (36.8 C) Oral 61 16 100 %  12/13/15 1024 (!) 147/91 mmHg 98 F (36.7 C) - (!) 57 14 100 %  12/13/15 1000 (!) 142/95 mmHg 97.5 F (36.4 C) - (!) 53 16 99 %  12/13/15 0945 (!) 155/96 mmHg 97.5 F (36.4 C) - (!) 51 14 99 %  12/13/15 0930 138/89 mmHg - - (!) 59 14 99 %  12/13/15 0915 (!) 163/94 mmHg - - 69 14 100 %  12/13/15 0900 (!) 160/92 mmHg - - 76 20 100 %  12/13/15 0845 (!) 168/100 mmHg - - 68 18 100 %  12/13/15 0842 (!) 176/97 mmHg 97.6 F (36.4 C) - 75 16 100 %     Recent laboratory studies:  Recent Labs  12/11/15 1100 12/13/15 1102  WBC 7.4 7.4  HGB 12.1 10.7*  HCT 38.4 33.3*  PLT 503* 366  NA 142  --   K 4.1  --   CL 108  --   CO2 30  --   BUN 16  --   CREATININE 1.09* 1.09*  GLUCOSE 101*  --   CALCIUM 9.4  --      Discharge Medications:     Medication List    TAKE these medications        acetaminophen 500 MG tablet  Commonly known as:  TYLENOL  Take 500 mg by mouth every 6 (six) hours as needed (For pain.).     ALPRAZolam 0.5 MG tablet  Commonly known as:  XANAX  Take 0.25 mg by mouth daily as needed for anxiety.     anagrelide 0.5 MG capsule  Commonly known as:  AGRYLIN  Take 1 capsule (0.5 mg total) by mouth daily.     aspirin 81 MG tablet  Take 1 tablet (81 mg total) by mouth daily.     BIOFREEZE ROLL-ON EX  Apply 1 application topically daily as needed (For pain.).     docusate sodium 100 MG  capsule  Commonly known as:  COLACE  Take 1 capsule (100 mg total) by mouth 2 (two) times daily as needed for mild constipation.     ibuprofen 800 MG tablet  Commonly known as:  ADVIL,MOTRIN  Take 1 tablet (800 mg total) by mouth 3 (three) times daily with meals as needed.     lidocaine 5 %  Commonly known as:  LIDODERM  Place 1 patch onto the skin daily as needed (For pain.).     losartan-hydrochlorothiazide 100-25 MG tablet  Commonly known as:  HYZAAR  Take 1 tablet by mouth daily.     meclizine 25 MG tablet  Commonly known as:  ANTIVERT  Take 1 tablet by mouth every 8 (eight) hours as needed for dizziness.     Melatonin 5 MG Tabs  Take 5-10 mg by mouth at bedtime.     methocarbamol 500 MG tablet  Commonly known as:  ROBAXIN  Take 1 tablet (500 mg total) by mouth every 6 (six) hours as needed for muscle spasms.     metoprolol 50 MG tablet  Commonly known as:  LOPRESSOR  Take 50 mg by mouth 2 (two) times daily.     oxyCODONE-acetaminophen 5-325 MG tablet  Commonly known as:  PERCOCET  Take 1-2 tablets by mouth every 4 (four) hours as needed for severe pain.        Diagnostic Studies: No results found.  Disposition: 01-Home or Self Care      Discharge Instructions    Call MD / Call 911    Complete by:  As directed   If you experience chest pain or shortness of breath, CALL 911 and be transported to the hospital emergency room.  If you develope a fever above 101 F, pus (white drainage) or increased drainage or redness at the wound, or calf pain, call your surgeon's office.     Call MD / Call 911    Complete by:  As directed   If you experience chest pain or shortness of breath, CALL 911 and be transported to the hospital emergency room.  If you develope a fever above 101 F, pus (white drainage) or increased drainage or redness at the wound, or calf pain, call your surgeon's office.     Constipation Prevention  Complete by:  As directed   Drink plenty of fluids.   Prune juice may be helpful.  You may use a stool softener, such as Colace (over the counter) 100 mg twice a day.  Use MiraLax (over the counter) for constipation as needed.     Constipation Prevention    Complete by:  As directed   Drink plenty of fluids.  Prune juice may be helpful.  You may use a stool softener, such as Colace (over the counter) 100 mg twice a day.  Use MiraLax (over the counter) for constipation as needed.     Diet - low sodium heart healthy    Complete by:  As directed      Diet - low sodium heart healthy    Complete by:  As directed      Increase activity slowly as tolerated    Complete by:  As directed      Increase activity slowly as tolerated    Complete by:  As directed            Follow-up Information    Follow up with BEANE,JEFFREY C, MD In 2 weeks.   Specialty:  Orthopedic Surgery   Why:  For suture removal   Contact information:   [address] [place] [address] [id number]        Signed: Cecilie Kicks. 12/14/2015, 7:54 AM

## 2015-12-14 NOTE — Care Management Note (Signed)
Case Management Note  Patient Details  Name: [name] MRN: [id number] Date of Birth: [birth year]  Subjective/Objective:    S/p Left shoulder arthroscopy, Subacromial decompression., Mini open rotator cuff repair.                Action/Plan: Discharge planning, no HH needs identified  Expected Discharge Date:  12/14/15               Expected Discharge Plan:  Home/Self Care  In-House Referral:  NA  Discharge planning Services  CM Consult  Post Acute Care Choice:  NA Choice offered to:  NA  DME Arranged:  N/A DME Agency:  NA  HH Arranged:  NA HH Agency:  NA  Status of Service:  Completed, signed off  Medicare Important Message Given:    Date Medicare IM Given:    Medicare IM give by:    Date Additional Medicare IM Given:    Additional Medicare Important Message give by:     If discussed at [place] of Stay Meetings, dates discussed:    Additional Comments:  Guadalupe Maple, RN 12/14/2015, 10:22 AM [phone]

## 2015-12-14 NOTE — Op Note (Signed)
NAMEROBBIE, OISHI NO.:  [id number]  MEDICAL RECORD NO.:  [id number]  LOCATION:  [id number]                         FACILITY:  [facility]  PHYSICIAN:  Susa Day, M.D.    DATE OF BIRTH:  1966-07-01  DATE OF PROCEDURE:  12/13/2015 DATE OF DISCHARGE:                              OPERATIVE REPORT   PREOPERATIVE DIAGNOSIS:  Rotator cuff tear, impingement syndrome, left shoulder.  POSTOPERATIVE DIAGNOSIS:  Rotator cuff tear, impingement syndrome, left shoulder.  PROCEDURE PERFORMED: 1. Left shoulder arthroscopy. 2. Subacromial decompression. 3. Mini open rotator cuff repair.  ANESTHESIA:  General.  ASSISTANT:  Lacie Draft, PA  BRIEF HISTORY:  A 49, persistent refractory shoulder pain, near full- thickness tear of the supraspinatus despite conservative treatment, had persistent disabling pain, was indicated for subacromial decompression. Mini open rotator cuff repair and arthroscopically assisted.  Risks and benefits were discussed including bleeding, infection, damage to neurovascular structures, no change in symptoms, worsening symptoms, DVT, PE, anesthetic complications, etc.  TECHNIQUE:  The patient in supine beach-chair position, after induction of adequate general anesthesia, that was preceded by left upper extremity block.  The left shoulder and upper extremity was prepped and draped in usual sterile fashion.  A surgical marker was utilized to delineate the acromion AC joint coracoid.  A standard posterolateral, anterolateral, and anterior portal markings were made and a posterolateral portal was fashioned with an #11 blade with incision through the skin only with the arm in the 70:30 position, advanced the arthroscopic camera in the glenohumeral joint penetrating atraumatically in-line with the coracoid.  The irrigant was utilized to insufflate the joint 65 mmHg.  We lavaged the glenohumeral joint.  Inspection revealed normal glenoid as well as  humeral head.  There was no labral tear and the biceps tendon was intact, within its groove, there was a tearing lateral to the biceps and the supraspinatus infraspinatus region articular surface.  I then introduced an 18-gauge needle off the anterolateral aspect of the acromion with the arm in neutral position. Through the tendon from the bursal side through the articular side bisecting this tear.  It was partially detached from which was significant tear noted from the articular surface.  We threaded a PDS suture through the 18-gauge needle and into the glenohumeral joint removing the needle.  Without further pathology noted in the glenohumeral joint, I repositioned the arthroscopic camera in the subacromial space.  Exuberant bursa was noted.  The entry point of the PDS suture was noted over the supraspinatus infraspinatus area.  There was no significant hypertrophy of the CA ligament, it was a type 1 acromion.  Hypertrophic bursa was noted.  Therefore, the sciatic converted to a mini open procedure.  I removed all arthroscopic equipment, closed the portal with 4-0 nylon simple suture, made a 2 cm incision over the anterolateral aspect of the acromion.  Identified the raphe between the anterolateral heads, divided in-line with the skin incision.  Self-retaining retractor was placed in the subacromial joint. Arthroscopic fluid was evacuated.  Hypertrophic bursa was noted.  I noted the area of the PDS suture, marked it with a marker.  Then, we removed it and performed a full bursectomy.  A  small prominence of the anterolateral aspect of the acromion was removed with 3 mm [place]. Again the CA layer was not hypertrophied.  There was no type 2 acromion, we felt that it was unnecessary to divide that.  Identified the area with PDS suture, appears the rotator cuff tendon was consistent with that seen on the MRI supraspinatus infraspinatus region. More towards the supraspinatus insertion.   Lateral of the bicipital groove approximately a cm, there was another area close to the bicipital groove just proximal, greater tuberosity was hyperemic and frayed a separate bursal side tear.  The PDS identified tear, I made a longitudinal incision just about 0.5 cm, I debrided, that tendon, good bleeding tissue and repaired side-to-side with 1 Vicryl in a figure-of-eight suture.  The other area of concern as well I debrided superficially that and reinforced that area with a 0 Vicryl side-to-side.  The remainder of the cuff was unremarkable.  Again the hypertrophic bursa was noted a small prominence off the anterolateral aspect of the acromion.  We digitally lysed adhesions in the subacromial joint, copiously irrigated the wound.  She had full range.  I repaired the raphe with 1 Vicryl, subcu with 2-0, and skin with subcuticular Prolene.  Sterile dressing was applied, placed in a sling, extubated without difficulty, and transported to the recovery room in satisfactory condition.  The patient tolerated the procedure well.  No complications.  Assistant, Lacie Draft, [place].  Minimal blood loss.     Susa Day, M.D.     Geralynn Rile  D:  12/13/2015  T:  12/14/2015  Job:  [id number]

## 2016-01-17 DIAGNOSIS — S46012D Strain of muscle(s) and tendon(s) of the rotator cuff of left shoulder, subsequent encounter: Secondary | ICD-10-CM | POA: Diagnosis not present

## 2016-01-24 ENCOUNTER — Other Ambulatory Visit: Payer: Self-pay | Admitting: Hematology and Oncology

## 2016-01-24 ENCOUNTER — Telehealth: Payer: Self-pay | Admitting: Hematology and Oncology

## 2016-01-24 ENCOUNTER — Other Ambulatory Visit (HOSPITAL_BASED_OUTPATIENT_CLINIC_OR_DEPARTMENT_OTHER): Payer: Federal, State, Local not specified - PPO

## 2016-01-24 ENCOUNTER — Ambulatory Visit (HOSPITAL_BASED_OUTPATIENT_CLINIC_OR_DEPARTMENT_OTHER): Payer: Federal, State, Local not specified - PPO | Admitting: Hematology and Oncology

## 2016-01-24 ENCOUNTER — Encounter: Payer: Self-pay | Admitting: Hematology and Oncology

## 2016-01-24 VITALS — BP 119/80 | HR 54 | Temp 97.8°F | Resp 17 | Ht 70.0 in | Wt 174.7 lb

## 2016-01-24 DIAGNOSIS — D473 Essential (hemorrhagic) thrombocythemia: Secondary | ICD-10-CM

## 2016-01-24 DIAGNOSIS — Z299 Encounter for prophylactic measures, unspecified: Secondary | ICD-10-CM

## 2016-01-24 LAB — CBC WITH DIFFERENTIAL/PLATELET
BASO%: 1.5 % (ref 0.0–2.0)
BASOS ABS: 0.1 10*3/uL (ref 0.0–0.1)
EOS ABS: 0 10*3/uL (ref 0.0–0.5)
EOS%: 0.6 % (ref 0.0–7.0)
HCT: 39.4 % (ref 34.8–46.6)
HEMOGLOBIN: 12.7 g/dL (ref 11.6–15.9)
LYMPH%: 34.5 % (ref 14.0–49.7)
MCH: 30.1 pg (ref 25.1–34.0)
MCHC: 32.3 g/dL (ref 31.5–36.0)
MCV: 93.3 fL (ref 79.5–101.0)
MONO#: 0.6 10*3/uL (ref 0.1–0.9)
MONO%: 8.3 % (ref 0.0–14.0)
NEUT#: 3.6 10*3/uL (ref 1.5–6.5)
NEUT%: 55.1 % (ref 38.4–76.8)
PLATELETS: 534 10*3/uL — AB (ref 145–400)
RBC: 4.23 10*6/uL (ref 3.70–5.45)
RDW: 20.7 % — AB (ref 11.2–14.5)
WBC: 6.6 10*3/uL (ref 3.9–10.3)
lymph#: 2.3 10*3/uL (ref 0.9–3.3)

## 2016-01-24 LAB — TECHNOLOGIST REVIEW

## 2016-01-24 NOTE — Assessment & Plan Note (Signed)
She was treated with combination of hydroxyurea and anagrelide in the past but had been off treatment for 6 months due to major depression. To simplify her treatment, I recommend we just restarted back on anagrelide only. I am concerned with anemia and prior leukopenia that I do not take she will tolerate hydroxyurea well. She tolerated anagrelide 0.5 mg daily very well without any side effects and her platelet count is responding I recommend we continue the same. In the meantime, she will continue 81 mg aspirin to prevent risk of blood clot.  

## 2016-01-24 NOTE — Assessment & Plan Note (Signed)
The patient is due for colonoscopy. She agreed to be referred to GI for screening colonoscopy.

## 2016-01-24 NOTE — Telephone Encounter (Signed)
Gave and printed appt sched and avs for pt for DEC....the patient going to call [facility] to sched appt with Dr. Michail Sermon

## 2016-01-24 NOTE — Progress Notes (Signed)
[facility] OFFICE PROGRESS NOTE  Patient Care Team: Darcus Austin, MD as PCP - General (Family Medicine)  SUMMARY OF ONCOLOGIC HISTORY:  I reviewed the patient's records extensive and collaborated the history with the patient. Summary of her history is as follows: This patient was originally diagnosed with myeloproliferative disorder approximately 10 years ago. According to her, she have chronic back pain. MRI showed abnormal bone marrow signal and she was subsequently referred to see an oncologist. Bone marrow biopsy dated 12/10/2004, confirm essential thrombocytosis. The patient was started on Hydroxyurea 1000 mg daily for 5 days and 1500 mg on Tuesdays and Fridays and anagrelide 0.5 mg twice daily. She has poor compliance which she attributed to major depression. She self discontinued medication for over 6 months in 2016 to remain on aspirin to prevent risk of blood clots. She has frequent sweats which she attributed to menopausal symptoms. The patient denies any recent signs or symptoms of bleeding such as spontaneous epistaxis, hematuria or hematochezia. On 02/01/2015, her treatment is switched to 0.5 mg of anagrelide daily along with aspirin INTERVAL HISTORY: Please see below for problem oriented charting. She is doing well. She had recent surgery without complications. Her energy level is excellent. She denies recent diagnosis of blood clots. No recent infection. The patient denies any recent signs or symptoms of bleeding such as spontaneous epistaxis, hematuria or hematochezia.  REVIEW OF SYSTEMS:   Constitutional: Denies fevers, chills or abnormal weight loss Eyes: Denies blurriness of vision Ears, nose, mouth, throat, and face: Denies mucositis or sore throat Respiratory: Denies cough, dyspnea or wheezes Cardiovascular: Denies palpitation, chest discomfort or lower extremity swelling Gastrointestinal:  Denies nausea, heartburn or change in bowel habits Skin:  Denies abnormal skin rashes Lymphatics: Denies new lymphadenopathy or easy bruising Neurological:Denies numbness, tingling or new weaknesses Behavioral/Psych: Mood is stable, no new changes  All other systems were reviewed with the patient and are negative.  I have reviewed the past medical history, past surgical history, social history and family history with the patient and they are unchanged from previous note.  ALLERGIES:  has No Known Allergies.  MEDICATIONS:  Current Outpatient Prescriptions  Medication Sig Dispense Refill  . acetaminophen (TYLENOL) 500 MG tablet Take 500 mg by mouth every 6 (six) hours as needed (For pain.).    Marland Kitchen ALPRAZolam (XANAX) 0.5 MG tablet Take 0.25 mg by mouth daily as needed for anxiety.    Marland Kitchen anagrelide (AGRYLIN) 0.5 MG capsule Take 1 capsule (0.5 mg total) by mouth daily. 30 capsule 5  . aspirin 81 MG tablet Take 1 tablet (81 mg total) by mouth daily. 30 tablet 0  . docusate sodium (COLACE) 100 MG capsule Take 1 capsule (100 mg total) by mouth 2 (two) times daily as needed for mild constipation. 30 capsule 1  . ibuprofen (ADVIL,MOTRIN) 800 MG tablet Take 1 tablet (800 mg total) by mouth 3 (three) times daily with meals as needed. 90 tablet 1  . lidocaine (LIDODERM) 5 % Place 1 patch onto the skin daily as needed (For pain.).   0  . losartan-hydrochlorothiazide (HYZAAR) 100-25 MG per tablet Take 1 tablet by mouth daily.     . meclizine (ANTIVERT) 25 MG tablet Take 1 tablet by mouth every 8 (eight) hours as needed for dizziness.   0  . Melatonin 5 MG TABS Take 5-10 mg by mouth at bedtime.     . Menthol, Topical Analgesic, (BIOFREEZE ROLL-ON EX) Apply 1 application topically daily as needed (For pain.).    Marland Kitchen  methocarbamol (ROBAXIN) 500 MG tablet Take 1 tablet (500 mg total) by mouth every 6 (six) hours as needed for muscle spasms. 40 tablet 1  . metoprolol (LOPRESSOR) 50 MG tablet Take 50 mg by mouth 2 (two) times daily.    Marland Kitchen oxyCODONE-acetaminophen (PERCOCET)  5-325 MG tablet Take 1-2 tablets by mouth every 4 (four) hours as needed for severe pain. 40 tablet 0   No current facility-administered medications for this visit.    PHYSICAL EXAMINATION: ECOG PERFORMANCE STATUS: 0 - Asymptomatic  Filed Vitals:   01/24/16 0954  BP: 119/80  Pulse: 54  Temp: 97.8 F (36.6 C)  Resp: 17   Filed Weights   01/24/16 0954  Weight: 174 lb 11.2 oz (79.243 kg)    GENERAL:alert, no distress and comfortable SKIN: skin color, texture, turgor are normal, no rashes or significant lesions EYES: normal, Conjunctiva are pink and non-injected, sclera clear OROPHARYNX:no exudate, no erythema and lips, buccal mucosa, and tongue normal  NECK: supple, thyroid normal size, non-tender, without nodularity LYMPH:  no palpable lymphadenopathy in the cervical, axillary or inguinal LUNGS: clear to auscultation and percussion with normal breathing effort HEART: regular rate & rhythm and no murmurs and no lower extremity edema ABDOMEN:abdomen soft, non-tender and normal bowel sounds Musculoskeletal:no cyanosis of digits and no clubbing  NEURO: alert & oriented x 3 with fluent speech, no focal motor/sensory deficits  LABORATORY DATA:  I have reviewed the data as listed    Component Value Date/Time   NA 142 12/11/2015 1100   NA 141 11/02/2014 1308   K 4.1 12/11/2015 1100   K 3.8 11/02/2014 1308   CL 108 12/11/2015 1100   CL 106 10/06/2012 1002   CO2 30 12/11/2015 1100   CO2 24 11/02/2014 1308   GLUCOSE 101* 12/11/2015 1100   GLUCOSE 99 11/02/2014 1308   GLUCOSE 104* 10/06/2012 1002   BUN 16 12/11/2015 1100   BUN 9.8 11/02/2014 1308   CREATININE 1.09* 12/13/2015 1102   CREATININE 0.9 11/02/2014 1308   CALCIUM 9.4 12/11/2015 1100   CALCIUM 9.2 11/02/2014 1308   PROT 7.1 11/02/2014 1308   PROT 7.6 04/21/2013 1027   ALBUMIN 4.0 11/02/2014 1308   ALBUMIN 4.1 04/21/2013 1027   AST 23 11/02/2014 1308   AST 22 04/21/2013 1027   ALT 22 11/02/2014 1308   ALT 18  04/21/2013 1027   ALKPHOS 118 11/02/2014 1308   ALKPHOS 83 04/21/2013 1027   BILITOT 0.34 11/02/2014 1308   BILITOT 0.3 04/21/2013 1027   GFRNONAA 58* 12/13/2015 1102   GFRAA >60 12/13/2015 1102    No results found for: SPEP, UPEP  Lab Results  Component Value Date   WBC 6.6 01/24/2016   NEUTROABS 3.6 01/24/2016   HGB 12.7 01/24/2016   HCT 39.4 01/24/2016   MCV 93.3 01/24/2016   PLT 534* 01/24/2016      Chemistry      Component Value Date/Time   NA 142 12/11/2015 1100   NA 141 11/02/2014 1308   K 4.1 12/11/2015 1100   K 3.8 11/02/2014 1308   CL 108 12/11/2015 1100   CL 106 10/06/2012 1002   CO2 30 12/11/2015 1100   CO2 24 11/02/2014 1308   BUN 16 12/11/2015 1100   BUN 9.8 11/02/2014 1308   CREATININE 1.09* 12/13/2015 1102   CREATININE 0.9 11/02/2014 1308      Component Value Date/Time   CALCIUM 9.4 12/11/2015 1100   CALCIUM 9.2 11/02/2014 1308   ALKPHOS 118 11/02/2014 1308  ALKPHOS 83 04/21/2013 1027   AST 23 11/02/2014 1308   AST 22 04/21/2013 1027   ALT 22 11/02/2014 1308   ALT 18 04/21/2013 1027   BILITOT 0.34 11/02/2014 1308   BILITOT 0.3 04/21/2013 1027     ASSESSMENT & PLAN:  Essential thrombocythemia She was treated with combination of hydroxyurea and anagrelide in the past but had been off treatment for 6 months due to major depression. To simplify her treatment, I recommend we just restarted back on anagrelide only. I am concerned with anemia and prior leukopenia that I do not take she will tolerate hydroxyurea well. She tolerated anagrelide 0.5 mg daily very well without any side effects and her platelet count is responding I recommend we continue the same. In the meantime, she will continue 81 mg aspirin to prevent risk of blood clot.  Preventive measure The patient is due for colonoscopy. She agreed to be referred to GI for screening colonoscopy.   Orders Placed This Encounter  Procedures  . CBC with Differential/Platelet    Standing  Status: Future     Number of Occurrences:      Standing Expiration Date: 02/27/2017  . Ambulatory referral to Gastroenterology    Referral Priority:  Routine    Referral Type:  Consultation    Referral Reason:  Specialty Services Required    Number of Visits Requested:  1   All questions were answered. The patient knows to call the clinic with any problems, questions or concerns. No barriers to learning was detected. I spent 15 minutes counseling the patient face to face. The total time spent in the appointment was 20 minutes and more than 50% was on counseling and review of test results     [facility], [place], MD 01/24/2016 5:49 PM

## 2016-01-25 DIAGNOSIS — S46012D Strain of muscle(s) and tendon(s) of the rotator cuff of left shoulder, subsequent encounter: Secondary | ICD-10-CM | POA: Diagnosis not present

## 2016-02-07 DIAGNOSIS — S46012D Strain of muscle(s) and tendon(s) of the rotator cuff of left shoulder, subsequent encounter: Secondary | ICD-10-CM | POA: Diagnosis not present

## 2016-02-29 DIAGNOSIS — Z4789 Encounter for other orthopedic aftercare: Secondary | ICD-10-CM | POA: Diagnosis not present

## 2016-03-28 DIAGNOSIS — F430 Acute stress reaction: Secondary | ICD-10-CM | POA: Diagnosis not present

## 2016-03-28 DIAGNOSIS — F4312 Post-traumatic stress disorder, chronic: Secondary | ICD-10-CM | POA: Diagnosis not present

## 2016-03-28 DIAGNOSIS — F322 Major depressive disorder, single episode, severe without psychotic features: Secondary | ICD-10-CM | POA: Diagnosis not present

## 2016-04-07 DIAGNOSIS — T7840XA Allergy, unspecified, initial encounter: Secondary | ICD-10-CM | POA: Diagnosis not present

## 2016-04-07 DIAGNOSIS — I10 Essential (primary) hypertension: Secondary | ICD-10-CM | POA: Diagnosis not present

## 2016-04-24 DIAGNOSIS — F322 Major depressive disorder, single episode, severe without psychotic features: Secondary | ICD-10-CM | POA: Diagnosis not present

## 2016-04-24 DIAGNOSIS — I10 Essential (primary) hypertension: Secondary | ICD-10-CM | POA: Diagnosis not present

## 2016-04-24 DIAGNOSIS — G43109 Migraine with aura, not intractable, without status migrainosus: Secondary | ICD-10-CM | POA: Diagnosis not present

## 2016-07-24 ENCOUNTER — Encounter: Payer: Self-pay | Admitting: Hematology and Oncology

## 2016-07-24 ENCOUNTER — Ambulatory Visit (HOSPITAL_BASED_OUTPATIENT_CLINIC_OR_DEPARTMENT_OTHER): Payer: Federal, State, Local not specified - PPO | Admitting: Hematology and Oncology

## 2016-07-24 ENCOUNTER — Other Ambulatory Visit: Payer: Self-pay | Admitting: Hematology and Oncology

## 2016-07-24 ENCOUNTER — Other Ambulatory Visit (HOSPITAL_BASED_OUTPATIENT_CLINIC_OR_DEPARTMENT_OTHER): Payer: Federal, State, Local not specified - PPO

## 2016-07-24 ENCOUNTER — Telehealth: Payer: Self-pay | Admitting: Hematology and Oncology

## 2016-07-24 VITALS — BP 121/81 | HR 57 | Temp 97.9°F | Resp 18 | Ht 70.0 in | Wt 177.8 lb

## 2016-07-24 DIAGNOSIS — D473 Essential (hemorrhagic) thrombocythemia: Secondary | ICD-10-CM | POA: Diagnosis not present

## 2016-07-24 DIAGNOSIS — D649 Anemia, unspecified: Secondary | ICD-10-CM | POA: Diagnosis not present

## 2016-07-24 DIAGNOSIS — Z299 Encounter for prophylactic measures, unspecified: Secondary | ICD-10-CM

## 2016-07-24 LAB — CBC WITH DIFFERENTIAL/PLATELET
BASO%: 1.5 % (ref 0.0–2.0)
Basophils Absolute: 0.1 10*3/uL (ref 0.0–0.1)
EOS ABS: 0 10*3/uL (ref 0.0–0.5)
EOS%: 0.7 % (ref 0.0–7.0)
HEMATOCRIT: 35.6 % (ref 34.8–46.6)
HGB: 11.6 g/dL (ref 11.6–15.9)
LYMPH#: 1.9 10*3/uL (ref 0.9–3.3)
LYMPH%: 33.6 % (ref 14.0–49.7)
MCH: 30.4 pg (ref 25.1–34.0)
MCHC: 32.5 g/dL (ref 31.5–36.0)
MCV: 93.4 fL (ref 79.5–101.0)
MONO#: 0.5 10*3/uL (ref 0.1–0.9)
MONO%: 8.6 % (ref 0.0–14.0)
NEUT%: 55.6 % (ref 38.4–76.8)
NEUTROS ABS: 3.2 10*3/uL (ref 1.5–6.5)
PLATELETS: 520 10*3/uL — AB (ref 145–400)
RBC: 3.81 10*6/uL (ref 3.70–5.45)
RDW: 21 % — ABNORMAL HIGH (ref 11.2–14.5)
WBC: 5.7 10*3/uL (ref 3.9–10.3)

## 2016-07-24 LAB — TECHNOLOGIST REVIEW

## 2016-07-24 NOTE — Assessment & Plan Note (Signed)
She was treated with combination of hydroxyurea and anagrelide in the past but had been off treatment for 6 months due to major depression. To simplify her treatment, I recommend we just restarted back on anagrelide only. I am concerned with anemia and prior leukopenia that I do not take she will tolerate hydroxyurea well. She tolerated anagrelide 0.5 mg daily very well without any side effect Breasts patient has demonstrated great compliance, I recommend increasing anagrelide to 1 mg daily and see her back in 3 months In the meantime, she will continue 81 mg aspirin to prevent risk of blood clot.

## 2016-07-24 NOTE — Telephone Encounter (Signed)
Appointments scheduled per 07/24/16 los. Patient was given a copy of the appointment schedule and AVS report, per 07/24/16 los.

## 2016-07-24 NOTE — Assessment & Plan Note (Signed)
The patient is due for colonoscopy. We discussed the importance of preventive care and reviewed the vaccination programs. She does not have any prior allergic reactions to influenza vaccination. She decline to proceed with influenza vaccination today

## 2016-07-24 NOTE — Progress Notes (Signed)
[place] OFFICE PROGRESS NOTE  Patient Care Team: Darcus Austin, MD as PCP - General (Family Medicine)  SUMMARY OF ONCOLOGIC HISTORY:  I reviewed the patient's records extensive and collaborated the history with the patient. Summary of her history is as follows: This patient was originally diagnosed with myeloproliferative disorder approximately 10 years ago. According to her, she have chronic back pain. MRI showed abnormal bone marrow signal and she was subsequently referred to see an oncologist. Bone marrow biopsy dated 12/10/2004, confirm essential thrombocytosis. The patient was started on Hydroxyurea 1000 mg daily for 5 days and 1500 mg on Tuesdays and Fridays and anagrelide 0.5 mg twice daily. She has poor compliance which she attributed to major depression. She self discontinued medication for over 6 months in 2016 to remain on aspirin to prevent risk of blood clots. She has frequent sweats which she attributed to menopausal symptoms. The patient denies any recent signs or symptoms of bleeding such as spontaneous epistaxis, hematuria or hematochezia. On 02/01/2015, her treatment is switched to 0.5 mg of anagrelide daily along with aspirin  INTERVAL HISTORY: Please see below for problem oriented charting. She feels well. Denies recent infection. She is compliant taking all her medications. The patient denies any recent signs or symptoms of bleeding such as spontaneous epistaxis, hematuria or hematochezia.   REVIEW OF SYSTEMS:   Constitutional: Denies fevers, chills or abnormal weight loss Eyes: Denies blurriness of vision Ears, nose, mouth, throat, and face: Denies mucositis or sore throat Respiratory: Denies cough, dyspnea or wheezes Cardiovascular: Denies palpitation, chest discomfort or lower extremity swelling Gastrointestinal:  Denies nausea, heartburn or change in bowel habits Skin: Denies abnormal skin rashes Lymphatics: Denies new lymphadenopathy or easy  bruising Neurological:Denies numbness, tingling or new weaknesses Behavioral/Psych: Mood is stable, no new changes  All other systems were reviewed with the patient and are negative.  I have reviewed the past medical history, past surgical history, social history and family history with the patient and they are unchanged from previous note.  ALLERGIES:  has No Known Allergies.  MEDICATIONS:  Current Outpatient Prescriptions  Medication Sig Dispense Refill  . acetaminophen (TYLENOL) 500 MG tablet Take 500 mg by mouth every 6 (six) hours as needed (For pain.).    Marland Kitchen ALPRAZolam (XANAX) 0.5 MG tablet Take 0.25 mg by mouth daily as needed for anxiety.    Marland Kitchen aspirin 81 MG tablet Take 1 tablet (81 mg total) by mouth daily. 30 tablet 0  . lidocaine (LIDODERM) 5 % Place 1 patch onto the skin daily as needed (For pain.).   0  . losartan-hydrochlorothiazide (HYZAAR) 100-25 MG per tablet Take 1 tablet by mouth daily.     . Melatonin 5 MG TABS Take 5-10 mg by mouth at bedtime.     . Menthol, Topical Analgesic, (BIOFREEZE ROLL-ON EX) Apply 1 application topically daily as needed (For pain.).    Marland Kitchen metoprolol (LOPRESSOR) 50 MG tablet Take 50 mg by mouth 2 (two) times daily.    Marland Kitchen anagrelide (AGRYLIN) 1 MG capsule Take 1 capsule (1 mg total) by mouth daily. 30 capsule 9   No current facility-administered medications for this visit.     PHYSICAL EXAMINATION: ECOG PERFORMANCE STATUS: 1 - Symptomatic but completely ambulatory  Vitals:   07/24/16 0944  BP: 121/81  Pulse: (!) 57  Resp: 18  Temp: 97.9 F (36.6 C)   Filed Weights   07/24/16 0944  Weight: 177 lb 12.8 oz (80.6 kg)    GENERAL:alert, no distress  and comfortable SKIN: skin color, texture, turgor are normal, no rashes or significant lesions EYES: normal, Conjunctiva are pink and non-injected, sclera clear Musculoskeletal:no cyanosis of digits and no clubbing  NEURO: alert & oriented x 3 with fluent speech, no focal motor/sensory  deficits  LABORATORY DATA:  I have reviewed the data as listed    Component Value Date/Time   NA 142 12/11/2015 1100   NA 141 11/02/2014 1308   K 4.1 12/11/2015 1100   K 3.8 11/02/2014 1308   CL 108 12/11/2015 1100   CL 106 10/06/2012 1002   CO2 30 12/11/2015 1100   CO2 24 11/02/2014 1308   GLUCOSE 101 (H) 12/11/2015 1100   GLUCOSE 99 11/02/2014 1308   GLUCOSE 104 (H) 10/06/2012 1002   BUN 16 12/11/2015 1100   BUN 9.8 11/02/2014 1308   CREATININE 1.09 (H) 12/13/2015 1102   CREATININE 0.9 11/02/2014 1308   CALCIUM 9.4 12/11/2015 1100   CALCIUM 9.2 11/02/2014 1308   PROT 7.1 11/02/2014 1308   ALBUMIN 4.0 11/02/2014 1308   AST 23 11/02/2014 1308   ALT 22 11/02/2014 1308   ALKPHOS 118 11/02/2014 1308   BILITOT 0.34 11/02/2014 1308   GFRNONAA 58 (L) 12/13/2015 1102   GFRAA >60 12/13/2015 1102    No results found for: SPEP, UPEP  Lab Results  Component Value Date   WBC 5.7 07/24/2016   NEUTROABS 3.2 07/24/2016   HGB 11.6 07/24/2016   HCT 35.6 07/24/2016   MCV 93.4 07/24/2016   PLT 520 (H) 07/24/2016      Chemistry      Component Value Date/Time   NA 142 12/11/2015 1100   NA 141 11/02/2014 1308   K 4.1 12/11/2015 1100   K 3.8 11/02/2014 1308   CL 108 12/11/2015 1100   CL 106 10/06/2012 1002   CO2 30 12/11/2015 1100   CO2 24 11/02/2014 1308   BUN 16 12/11/2015 1100   BUN 9.8 11/02/2014 1308   CREATININE 1.09 (H) 12/13/2015 1102   CREATININE 0.9 11/02/2014 1308      Component Value Date/Time   CALCIUM 9.4 12/11/2015 1100   CALCIUM 9.2 11/02/2014 1308   ALKPHOS 118 11/02/2014 1308   AST 23 11/02/2014 1308   ALT 22 11/02/2014 1308   BILITOT 0.34 11/02/2014 1308       ASSESSMENT & PLAN:  Essential thrombocythemia She was treated with combination of hydroxyurea and anagrelide in the past but had been off treatment for 6 months due to major depression. To simplify her treatment, I recommend we just restarted back on anagrelide only. I am concerned with  anemia and prior leukopenia that I do not take she will tolerate hydroxyurea well. She tolerated anagrelide 0.5 mg daily very well without any side effect Breasts patient has demonstrated great compliance, I recommend increasing anagrelide to 1 mg daily and see her back in 3 months In the meantime, she will continue 81 mg aspirin to prevent risk of blood clot.  Preventive measure The patient is due for colonoscopy. We discussed the importance of preventive care and reviewed the vaccination programs. She does not have any prior allergic reactions to influenza vaccination. She decline to proceed with influenza vaccination today    Orders Placed This Encounter  Procedures  . CBC with Differential/Platelet    Standing Status:   Future    Standing Expiration Date:   08/28/2017   All questions were answered. The patient knows to call the clinic with any problems, questions or concerns.  No barriers to learning was detected. I spent 15 minutes counseling the patient face to face. The total time spent in the appointment was 20 minutes and more than 50% was on counseling and review of test results     [name], MD 07/24/2016 10:59 AM

## 2016-07-24 NOTE — Telephone Encounter (Signed)
Increased dose to 1 mg daily

## 2016-09-03 DIAGNOSIS — Z1211 Encounter for screening for malignant neoplasm of colon: Secondary | ICD-10-CM | POA: Diagnosis not present

## 2016-09-03 DIAGNOSIS — Z23 Encounter for immunization: Secondary | ICD-10-CM | POA: Diagnosis not present

## 2016-09-03 DIAGNOSIS — F322 Major depressive disorder, single episode, severe without psychotic features: Secondary | ICD-10-CM | POA: Diagnosis not present

## 2016-09-03 DIAGNOSIS — I10 Essential (primary) hypertension: Secondary | ICD-10-CM | POA: Diagnosis not present

## 2016-10-23 ENCOUNTER — Encounter: Payer: Self-pay | Admitting: Hematology and Oncology

## 2016-10-23 ENCOUNTER — Telehealth: Payer: Self-pay | Admitting: Hematology and Oncology

## 2016-10-23 ENCOUNTER — Ambulatory Visit (HOSPITAL_BASED_OUTPATIENT_CLINIC_OR_DEPARTMENT_OTHER): Payer: Federal, State, Local not specified - PPO | Admitting: Hematology and Oncology

## 2016-10-23 ENCOUNTER — Other Ambulatory Visit (HOSPITAL_BASED_OUTPATIENT_CLINIC_OR_DEPARTMENT_OTHER): Payer: Federal, State, Local not specified - PPO

## 2016-10-23 DIAGNOSIS — D473 Essential (hemorrhagic) thrombocythemia: Secondary | ICD-10-CM

## 2016-10-23 DIAGNOSIS — Z7982 Long term (current) use of aspirin: Secondary | ICD-10-CM

## 2016-10-23 DIAGNOSIS — D630 Anemia in neoplastic disease: Secondary | ICD-10-CM | POA: Diagnosis not present

## 2016-10-23 LAB — CBC WITH DIFFERENTIAL/PLATELET
BASO%: 1.6 % (ref 0.0–2.0)
Basophils Absolute: 0.1 10*3/uL (ref 0.0–0.1)
EOS%: 0.8 % (ref 0.0–7.0)
Eosinophils Absolute: 0 10*3/uL (ref 0.0–0.5)
HCT: 35.6 % (ref 34.8–46.6)
HEMOGLOBIN: 11.5 g/dL — AB (ref 11.6–15.9)
LYMPH%: 40.4 % (ref 14.0–49.7)
MCH: 30.1 pg (ref 25.1–34.0)
MCHC: 32.3 g/dL (ref 31.5–36.0)
MCV: 93.2 fL (ref 79.5–101.0)
MONO#: 0.5 10*3/uL (ref 0.1–0.9)
MONO%: 9.8 % (ref 0.0–14.0)
NEUT#: 2.4 10*3/uL (ref 1.5–6.5)
NEUT%: 47.4 % (ref 38.4–76.8)
PLATELETS: 494 10*3/uL — AB (ref 145–400)
RBC: 3.82 10*6/uL (ref 3.70–5.45)
RDW: 19.3 % — AB (ref 11.2–14.5)
WBC: 5.1 10*3/uL (ref 3.9–10.3)
lymph#: 2.1 10*3/uL (ref 0.9–3.3)
nRBC: 2 % — ABNORMAL HIGH (ref 0–0)

## 2016-10-23 LAB — TECHNOLOGIST REVIEW

## 2016-10-23 NOTE — Telephone Encounter (Signed)
Appointments scheduled per 3.29.18 LOS. Patient given AVS report and calendars with future scheduled appointments. °

## 2016-10-23 NOTE — Assessment & Plan Note (Signed)
This is likely anemia of chronic disease. The patient denies recent history of bleeding such as epistaxis, hematuria or hematochezia. She is asymptomatic from the anemia. We will observe for now.  

## 2016-10-23 NOTE — Progress Notes (Signed)
[place] OFFICE PROGRESS NOTE  Patient Care Team: Darcus Austin, MD as PCP - General (Family Medicine)  SUMMARY OF ONCOLOGIC HISTORY:  I reviewed the patient's records extensive and collaborated the history with the patient. Summary of her history is as follows: This patient was originally diagnosed with myeloproliferative disorder approximately 10 years ago. According to her, she have chronic back pain. MRI showed abnormal bone marrow signal and she was subsequently referred to see an oncologist. Bone marrow biopsy dated 12/10/2004, confirm essential thrombocytosis. The patient was started on Hydroxyurea 1000 mg daily for 5 days and 1500 mg on Tuesdays and Fridays and anagrelide 0.5 mg twice daily. She has poor compliance which she attributed to major depression. She self discontinued medication for over 6 months in 2016 to remain on aspirin to prevent risk of blood clots. She has frequent sweats which she attributed to menopausal symptoms. The patient denies any recent signs or symptoms of bleeding such as spontaneous epistaxis, hematuria or hematochezia. On 02/01/2015, her treatment is switched to 0.5 mg of anagrelide daily along with aspirin In December 2017, anagrelide is increased to 1 mg daily along with aspirin  INTERVAL HISTORY: Please see below for problem oriented charting. She feels well. She is compliant taking medication as directed She denies recent infection The patient denies any recent signs or symptoms of bleeding such as spontaneous epistaxis, hematuria or hematochezia.  REVIEW OF SYSTEMS:   Constitutional: Denies fevers, chills or abnormal weight loss Eyes: Denies blurriness of vision Ears, nose, mouth, throat, and face: Denies mucositis or sore throat Respiratory: Denies cough, dyspnea or wheezes Cardiovascular: Denies palpitation, chest discomfort or lower extremity swelling Gastrointestinal:  Denies nausea, heartburn or change in bowel habits Skin:  Denies abnormal skin rashes Lymphatics: Denies new lymphadenopathy or easy bruising Neurological:Denies numbness, tingling or new weaknesses Behavioral/Psych: Mood is stable, no new changes  All other systems were reviewed with the patient and are negative.  I have reviewed the past medical history, past surgical history, social history and family history with the patient and they are unchanged from previous note.  ALLERGIES:  has No Known Allergies.  MEDICATIONS:  Current Outpatient Prescriptions  Medication Sig Dispense Refill  . acetaminophen (TYLENOL) 500 MG tablet Take 500 mg by mouth every 6 (six) hours as needed (For pain.).    Marland Kitchen ALPRAZolam (XANAX) 0.5 MG tablet Take 0.25 mg by mouth daily as needed for anxiety.    Marland Kitchen anagrelide (AGRYLIN) 1 MG capsule Take 1 capsule (1 mg total) by mouth daily. 30 capsule 9  . aspirin 81 MG tablet Take 1 tablet (81 mg total) by mouth daily. 30 tablet 0  . lidocaine (LIDODERM) 5 % Place 1 patch onto the skin daily as needed (For pain.).   0  . losartan-hydrochlorothiazide (HYZAAR) 100-25 MG per tablet Take 1 tablet by mouth daily.     . Melatonin 5 MG TABS Take 5-10 mg by mouth at bedtime.     . metoprolol (LOPRESSOR) 50 MG tablet Take 50 mg by mouth 2 (two) times daily.     No current facility-administered medications for this visit.     PHYSICAL EXAMINATION: ECOG PERFORMANCE STATUS: 0 - Asymptomatic  Vitals:   10/23/16 1457  BP: (!) 139/92  Pulse: (!) 54  Resp: 18  Temp: 97.9 F (36.6 C)   Filed Weights   10/23/16 1457  Weight: 181 lb 12.8 oz (82.5 kg)    GENERAL:alert, no distress and comfortable SKIN: skin color, texture, turgor are  normal, no rashes or significant lesions EYES: normal, Conjunctiva are pink and non-injected, sclera clear OROPHARYNX:no exudate, no erythema and lips, buccal mucosa, and tongue normal  NECK: supple, thyroid normal size, non-tender, without nodularity LYMPH:  no palpable lymphadenopathy in the  cervical, axillary or inguinal LUNGS: clear to auscultation and percussion with normal breathing effort HEART: regular rate & rhythm and no murmurs and no lower extremity edema ABDOMEN:abdomen soft, non-tender and normal bowel sounds Musculoskeletal:no cyanosis of digits and no clubbing  NEURO: alert & oriented x 3 with fluent speech, no focal motor/sensory deficits  LABORATORY DATA:  I have reviewed the data as listed    Component Value Date/Time   NA 142 12/11/2015 1100   NA 141 11/02/2014 1308   K 4.1 12/11/2015 1100   K 3.8 11/02/2014 1308   CL 108 12/11/2015 1100   CL 106 10/06/2012 1002   CO2 30 12/11/2015 1100   CO2 24 11/02/2014 1308   GLUCOSE 101 (H) 12/11/2015 1100   GLUCOSE 99 11/02/2014 1308   GLUCOSE 104 (H) 10/06/2012 1002   BUN 16 12/11/2015 1100   BUN 9.8 11/02/2014 1308   CREATININE 1.09 (H) 12/13/2015 1102   CREATININE 0.9 11/02/2014 1308   CALCIUM 9.4 12/11/2015 1100   CALCIUM 9.2 11/02/2014 1308   PROT 7.1 11/02/2014 1308   ALBUMIN 4.0 11/02/2014 1308   AST 23 11/02/2014 1308   ALT 22 11/02/2014 1308   ALKPHOS 118 11/02/2014 1308   BILITOT 0.34 11/02/2014 1308   GFRNONAA 58 (L) 12/13/2015 1102   GFRAA >60 12/13/2015 1102    No results found for: SPEP, UPEP  Lab Results  Component Value Date   WBC 5.1 10/23/2016   NEUTROABS 2.4 10/23/2016   HGB 11.5 (L) 10/23/2016   HCT 35.6 10/23/2016   MCV 93.2 10/23/2016   PLT 494 (H) 10/23/2016      Chemistry      Component Value Date/Time   NA 142 12/11/2015 1100   NA 141 11/02/2014 1308   K 4.1 12/11/2015 1100   K 3.8 11/02/2014 1308   CL 108 12/11/2015 1100   CL 106 10/06/2012 1002   CO2 30 12/11/2015 1100   CO2 24 11/02/2014 1308   BUN 16 12/11/2015 1100   BUN 9.8 11/02/2014 1308   CREATININE 1.09 (H) 12/13/2015 1102   CREATININE 0.9 11/02/2014 1308      Component Value Date/Time   CALCIUM 9.4 12/11/2015 1100   CALCIUM 9.2 11/02/2014 1308   ALKPHOS 118 11/02/2014 1308   AST 23 11/02/2014  1308   ALT 22 11/02/2014 1308   BILITOT 0.34 11/02/2014 1308      ASSESSMENT & PLAN:  Essential thrombocythemia She was treated with combination of hydroxyurea and anagrelide in the past but had been off treatment for 6 months due to major depression. To simplify her treatment, I recommend we just restarted back on anagrelide only. I am concerned with anemia and prior leukopenia that I do not take she will tolerate hydroxyurea well. She tolerated anagrelide 1 mg daily very well without any side effect In the meantime, she will continue 81 mg aspirin to prevent risk of blood clot.  Anemia in neoplastic disease This is likely anemia of chronic disease. The patient denies recent history of bleeding such as epistaxis, hematuria or hematochezia. She is asymptomatic from the anemia. We will observe for now.    No orders of the defined types were placed in this encounter.  All questions were answered. The patient knows  to call the clinic with any problems, questions or concerns. No barriers to learning was detected. I spent 10 minutes counseling the patient face to face. The total time spent in the appointment was 15 minutes and more than 50% was on counseling and review of test results     [name], MD 10/23/2016 3:22 PM

## 2016-10-23 NOTE — Assessment & Plan Note (Signed)
She was treated with combination of hydroxyurea and anagrelide in the past but had been off treatment for 6 months due to major depression. To simplify her treatment, I recommend we just restarted back on anagrelide only. I am concerned with anemia and prior leukopenia that I do not take she will tolerate hydroxyurea well. She tolerated anagrelide 1 mg daily very well without any side effect In the meantime, she will continue 81 mg aspirin to prevent risk of blood clot.

## 2017-02-06 ENCOUNTER — Telehealth: Payer: Self-pay | Admitting: Hematology and Oncology

## 2017-02-06 NOTE — Telephone Encounter (Signed)
R/s appt per NG PAL - per sch message 7/10 - patient is aware of new appt time and date - reminder letter sent in the mail.

## 2017-02-23 ENCOUNTER — Other Ambulatory Visit: Payer: Self-pay

## 2017-02-23 ENCOUNTER — Ambulatory Visit: Payer: Self-pay | Admitting: Hematology and Oncology

## 2017-03-05 ENCOUNTER — Other Ambulatory Visit (HOSPITAL_BASED_OUTPATIENT_CLINIC_OR_DEPARTMENT_OTHER): Payer: Federal, State, Local not specified - PPO

## 2017-03-05 ENCOUNTER — Telehealth: Payer: Self-pay | Admitting: Hematology and Oncology

## 2017-03-05 ENCOUNTER — Encounter: Payer: Self-pay | Admitting: Hematology and Oncology

## 2017-03-05 ENCOUNTER — Ambulatory Visit (HOSPITAL_BASED_OUTPATIENT_CLINIC_OR_DEPARTMENT_OTHER): Payer: Federal, State, Local not specified - PPO | Admitting: Hematology and Oncology

## 2017-03-05 ENCOUNTER — Other Ambulatory Visit: Payer: Self-pay | Admitting: Hematology and Oncology

## 2017-03-05 DIAGNOSIS — D630 Anemia in neoplastic disease: Secondary | ICD-10-CM | POA: Diagnosis not present

## 2017-03-05 DIAGNOSIS — I10 Essential (primary) hypertension: Secondary | ICD-10-CM

## 2017-03-05 DIAGNOSIS — D473 Essential (hemorrhagic) thrombocythemia: Secondary | ICD-10-CM

## 2017-03-05 LAB — CBC WITH DIFFERENTIAL/PLATELET
BASO%: 1.8 % (ref 0.0–2.0)
Basophils Absolute: 0.1 10*3/uL (ref 0.0–0.1)
EOS%: 0.8 % (ref 0.0–7.0)
Eosinophils Absolute: 0.1 10*3/uL (ref 0.0–0.5)
HCT: 34.6 % — ABNORMAL LOW (ref 34.8–46.6)
HGB: 11.3 g/dL — ABNORMAL LOW (ref 11.6–15.9)
LYMPH%: 28.7 % (ref 14.0–49.7)
MCH: 30.7 pg (ref 25.1–34.0)
MCHC: 32.7 g/dL (ref 31.5–36.0)
MCV: 93.9 fL (ref 79.5–101.0)
MONO#: 0.5 10*3/uL (ref 0.1–0.9)
MONO%: 8.2 % (ref 0.0–14.0)
NEUT%: 60.5 % (ref 38.4–76.8)
NEUTROS ABS: 3.8 10*3/uL (ref 1.5–6.5)
Platelets: 519 10*3/uL — ABNORMAL HIGH (ref 145–400)
RBC: 3.69 10*6/uL — AB (ref 3.70–5.45)
RDW: 20.6 % — AB (ref 11.2–14.5)
WBC: 6.3 10*3/uL (ref 3.9–10.3)
lymph#: 1.8 10*3/uL (ref 0.9–3.3)

## 2017-03-05 LAB — TECHNOLOGIST REVIEW

## 2017-03-05 NOTE — Telephone Encounter (Signed)
Scheduled appt per 8/9 los - Gave patient AVS and calender per los.  

## 2017-03-05 NOTE — Assessment & Plan Note (Signed)
Her blood pressure is very high It could be due to whitecoat hypertension I recommend close follow-up with primary care doctor for medication adjustment

## 2017-03-05 NOTE — Assessment & Plan Note (Signed)
This is likely anemia of chronic disease. The patient denies recent history of bleeding such as epistaxis, hematuria or hematochezia. She is asymptomatic from the anemia. We will observe for now.  

## 2017-03-05 NOTE — Progress Notes (Signed)
[place] OFFICE PROGRESS NOTE  Patient Care Team: Darcus Austin, MD as PCP - General (Family Medicine)  SUMMARY OF ONCOLOGIC HISTORY:  I reviewed the patient's records extensive and collaborated the history with the patient. Summary of her history is as follows: This patient was originally diagnosed with myeloproliferative disorder approximately 10 years ago. According to her, she have chronic back pain. MRI showed abnormal bone marrow signal and she was subsequently referred to see an oncologist. Bone marrow biopsy dated 12/10/2004, confirm essential thrombocytosis. The patient was started on Hydroxyurea 1000 mg daily for 5 days and 1500 mg on Tuesdays and Fridays and anagrelide 0.5 mg twice daily. She has poor compliance which she attributed to major depression. She self discontinued medication for over 6 months in 2016 to remain on aspirin to prevent risk of blood clots. She has frequent sweats which she attributed to menopausal symptoms. The patient denies any recent signs or symptoms of bleeding such as spontaneous epistaxis, hematuria or hematochezia. On 02/01/2015, her treatment is switched to 0.5 mg of anagrelide daily along with aspirin In December 2017, anagrelide is increased to 1 mg daily along with aspirin  INTERVAL HISTORY: Please see below for problem oriented charting. She feels well.  She is recently retired. She is not doing much and has gained some weight She have occasional headaches She stated she has been compliant with anagrelide and has not missed any recent doses She denies recent infection She had mild occasional bruising. The patient denies any recent signs or symptoms of bleeding such as spontaneous epistaxis, hematuria or hematochezia.   REVIEW OF SYSTEMS:   Constitutional: Denies fevers, chills or abnormal weight loss Eyes: Denies blurriness of vision Ears, nose, mouth, throat, and face: Denies mucositis or sore throat Respiratory: Denies  cough, dyspnea or wheezes Cardiovascular: Denies palpitation, chest discomfort or lower extremity swelling Gastrointestinal:  Denies nausea, heartburn or change in bowel habits Skin: Denies abnormal skin rashes Lymphatics: Denies new lymphadenopathy  Neurological:Denies numbness, tingling or new weaknesses Behavioral/Psych: Mood is stable, no new changes  All other systems were reviewed with the patient and are negative.  I have reviewed the past medical history, past surgical history, social history and family history with the patient and they are unchanged from previous note.  ALLERGIES:  has No Known Allergies.  MEDICATIONS:  Current Outpatient Prescriptions  Medication Sig Dispense Refill  . acetaminophen (TYLENOL) 500 MG tablet Take 500 mg by mouth every 6 (six) hours as needed (For pain.).    Marland Kitchen ALPRAZolam (XANAX) 0.5 MG tablet Take 0.25 mg by mouth daily as needed for anxiety.    Marland Kitchen anagrelide (AGRYLIN) 1 MG capsule Take 1 capsule (1 mg total) by mouth daily. 30 capsule 9  . aspirin 81 MG tablet Take 1 tablet (81 mg total) by mouth daily. 30 tablet 0  . lidocaine (LIDODERM) 5 % Place 1 patch onto the skin daily as needed (For pain.).   0  . losartan-hydrochlorothiazide (HYZAAR) 100-25 MG per tablet Take 1 tablet by mouth daily.     . Melatonin 5 MG TABS Take 5-10 mg by mouth at bedtime.     . metoprolol (LOPRESSOR) 50 MG tablet Take 50 mg by mouth 2 (two) times daily.     No current facility-administered medications for this visit.     PHYSICAL EXAMINATION: ECOG PERFORMANCE STATUS: 1 - Symptomatic but completely ambulatory  Vitals:   03/05/17 1002  BP: (!) 161/103  Pulse: 62  Resp: 18  Temp: 98.9  F (37.2 C)  SpO2: 100%   Filed Weights   03/05/17 1002  Weight: 188 lb 12.8 oz (85.6 kg)    GENERAL:alert, no distress and comfortable SKIN: skin color, texture, turgor are normal, no rashes or significant lesions EYES: normal, Conjunctiva are pink and non-injected,  sclera clear OROPHARYNX:no exudate, no erythema and lips, buccal mucosa, and tongue normal  NECK: supple, thyroid normal size, non-tender, without nodularity LYMPH:  no palpable lymphadenopathy in the cervical, axillary or inguinal LUNGS: clear to auscultation and percussion with normal breathing effort HEART: regular rate & rhythm and no murmurs and no lower extremity edema ABDOMEN:abdomen soft, non-tender and normal bowel sounds Musculoskeletal:no cyanosis of digits and no clubbing  NEURO: alert & oriented x 3 with fluent speech, no focal motor/sensory deficits  LABORATORY DATA:  I have reviewed the data as listed    Component Value Date/Time   NA 142 12/11/2015 1100   NA 141 11/02/2014 1308   K 4.1 12/11/2015 1100   K 3.8 11/02/2014 1308   CL 108 12/11/2015 1100   CL 106 10/06/2012 1002   CO2 30 12/11/2015 1100   CO2 24 11/02/2014 1308   GLUCOSE 101 (H) 12/11/2015 1100   GLUCOSE 99 11/02/2014 1308   GLUCOSE 104 (H) 10/06/2012 1002   BUN 16 12/11/2015 1100   BUN 9.8 11/02/2014 1308   CREATININE 1.09 (H) 12/13/2015 1102   CREATININE 0.9 11/02/2014 1308   CALCIUM 9.4 12/11/2015 1100   CALCIUM 9.2 11/02/2014 1308   PROT 7.1 11/02/2014 1308   ALBUMIN 4.0 11/02/2014 1308   AST 23 11/02/2014 1308   ALT 22 11/02/2014 1308   ALKPHOS 118 11/02/2014 1308   BILITOT 0.34 11/02/2014 1308   GFRNONAA 58 (L) 12/13/2015 1102   GFRAA >60 12/13/2015 1102    No results found for: SPEP, UPEP  Lab Results  Component Value Date   WBC 6.3 03/05/2017   NEUTROABS 3.8 03/05/2017   HGB 11.3 (L) 03/05/2017   HCT 34.6 (L) 03/05/2017   MCV 93.9 03/05/2017   PLT 519 (H) 03/05/2017      Chemistry      Component Value Date/Time   NA 142 12/11/2015 1100   NA 141 11/02/2014 1308   K 4.1 12/11/2015 1100   K 3.8 11/02/2014 1308   CL 108 12/11/2015 1100   CL 106 10/06/2012 1002   CO2 30 12/11/2015 1100   CO2 24 11/02/2014 1308   BUN 16 12/11/2015 1100   BUN 9.8 11/02/2014 1308    CREATININE 1.09 (H) 12/13/2015 1102   CREATININE 0.9 11/02/2014 1308      Component Value Date/Time   CALCIUM 9.4 12/11/2015 1100   CALCIUM 9.2 11/02/2014 1308   ALKPHOS 118 11/02/2014 1308   AST 23 11/02/2014 1308   ALT 22 11/02/2014 1308   BILITOT 0.34 11/02/2014 1308      ASSESSMENT & PLAN:  Essential thrombocythemia I am concerned with anemia and prior leukopenia that I do not take she will tolerate hydroxyurea well. She tolerated anagrelide 1 mg daily very well without any side effect Even though her platelet count is a little high, she is not symptomatic I favor continue to same dose for now and recheck in 3 months In the meantime, she will continue 81 mg aspirin to prevent risk of blood clot.  Anemia in neoplastic disease This is likely anemia of chronic disease. The patient denies recent history of bleeding such as epistaxis, hematuria or hematochezia. She is asymptomatic from the anemia. We  will observe for now.   Essential hypertension Her blood pressure is very high It could be due to whitecoat hypertension I recommend close follow-up with primary care doctor for medication adjustment   No orders of the defined types were placed in this encounter.  All questions were answered. The patient knows to call the clinic with any problems, questions or concerns. No barriers to learning was detected. I spent 10 minutes counseling the patient face to face. The total time spent in the appointment was 15 minutes and more than 50% was on counseling and review of test results     [name], MD 03/05/2017 10:07 AM

## 2017-03-05 NOTE — Assessment & Plan Note (Signed)
I am concerned with anemia and prior leukopenia that I do not take she will tolerate hydroxyurea well. She tolerated anagrelide 1 mg daily very well without any side effect Even though her platelet count is a little high, she is not symptomatic I favor continue to same dose for now and recheck in 3 months In the meantime, she will continue 81 mg aspirin to prevent risk of blood clot.

## 2017-03-11 DIAGNOSIS — I10 Essential (primary) hypertension: Secondary | ICD-10-CM | POA: Diagnosis not present

## 2017-03-11 DIAGNOSIS — L03011 Cellulitis of right finger: Secondary | ICD-10-CM | POA: Diagnosis not present

## 2017-06-05 ENCOUNTER — Encounter: Payer: Self-pay | Admitting: Hematology and Oncology

## 2017-06-05 ENCOUNTER — Other Ambulatory Visit: Payer: Self-pay

## 2017-06-05 ENCOUNTER — Ambulatory Visit: Payer: Self-pay | Admitting: Hematology and Oncology

## 2017-06-30 DIAGNOSIS — Z01419 Encounter for gynecological examination (general) (routine) without abnormal findings: Secondary | ICD-10-CM | POA: Diagnosis not present

## 2017-06-30 DIAGNOSIS — Z6827 Body mass index (BMI) 27.0-27.9, adult: Secondary | ICD-10-CM | POA: Diagnosis not present

## 2017-06-30 DIAGNOSIS — Z1231 Encounter for screening mammogram for malignant neoplasm of breast: Secondary | ICD-10-CM | POA: Diagnosis not present

## 2017-09-11 ENCOUNTER — Other Ambulatory Visit: Payer: Self-pay | Admitting: Hematology and Oncology

## 2017-10-27 ENCOUNTER — Other Ambulatory Visit: Payer: Self-pay

## 2017-10-27 ENCOUNTER — Ambulatory Visit: Payer: Self-pay | Admitting: Hematology and Oncology

## 2017-10-27 ENCOUNTER — Encounter: Payer: Self-pay | Admitting: Hematology and Oncology

## 2017-11-11 DIAGNOSIS — K080 Exfoliation of teeth due to systemic causes: Secondary | ICD-10-CM | POA: Diagnosis not present

## 2018-03-11 DIAGNOSIS — I10 Essential (primary) hypertension: Secondary | ICD-10-CM | POA: Diagnosis not present

## 2018-03-11 DIAGNOSIS — D473 Essential (hemorrhagic) thrombocythemia: Secondary | ICD-10-CM | POA: Diagnosis not present

## 2018-03-11 DIAGNOSIS — M79674 Pain in right toe(s): Secondary | ICD-10-CM | POA: Diagnosis not present

## 2018-03-17 ENCOUNTER — Telehealth: Payer: Self-pay

## 2018-03-17 NOTE — Telephone Encounter (Signed)
Left a detailed message concerning patient upcoming appointment as requested. Mailed a letter with a calender enclosed. Per 8/21 los

## 2018-03-25 ENCOUNTER — Other Ambulatory Visit: Payer: Self-pay | Admitting: Hematology and Oncology

## 2018-03-25 ENCOUNTER — Inpatient Hospital Stay: Payer: Federal, State, Local not specified - PPO

## 2018-03-25 ENCOUNTER — Encounter: Payer: Self-pay | Admitting: Hematology and Oncology

## 2018-03-25 ENCOUNTER — Inpatient Hospital Stay
Payer: Federal, State, Local not specified - PPO | Attending: Hematology and Oncology | Admitting: Hematology and Oncology

## 2018-03-25 ENCOUNTER — Telehealth: Payer: Self-pay | Admitting: Hematology and Oncology

## 2018-03-25 VITALS — BP 159/92 | HR 55 | Temp 97.7°F | Resp 18 | Ht 70.0 in | Wt 189.8 lb

## 2018-03-25 DIAGNOSIS — M79673 Pain in unspecified foot: Secondary | ICD-10-CM | POA: Diagnosis not present

## 2018-03-25 DIAGNOSIS — N951 Menopausal and female climacteric states: Secondary | ICD-10-CM

## 2018-03-25 DIAGNOSIS — M064 Inflammatory polyarthropathy: Secondary | ICD-10-CM | POA: Diagnosis not present

## 2018-03-25 DIAGNOSIS — M1990 Unspecified osteoarthritis, unspecified site: Secondary | ICD-10-CM | POA: Diagnosis not present

## 2018-03-25 DIAGNOSIS — Z9114 Patient's other noncompliance with medication regimen: Secondary | ICD-10-CM | POA: Diagnosis not present

## 2018-03-25 DIAGNOSIS — M19072 Primary osteoarthritis, left ankle and foot: Secondary | ICD-10-CM | POA: Diagnosis not present

## 2018-03-25 DIAGNOSIS — D649 Anemia, unspecified: Secondary | ICD-10-CM

## 2018-03-25 DIAGNOSIS — F329 Major depressive disorder, single episode, unspecified: Secondary | ICD-10-CM | POA: Diagnosis not present

## 2018-03-25 DIAGNOSIS — Z7982 Long term (current) use of aspirin: Secondary | ICD-10-CM

## 2018-03-25 DIAGNOSIS — M79642 Pain in left hand: Secondary | ICD-10-CM | POA: Diagnosis not present

## 2018-03-25 DIAGNOSIS — Z79899 Other long term (current) drug therapy: Secondary | ICD-10-CM | POA: Diagnosis not present

## 2018-03-25 DIAGNOSIS — D539 Nutritional anemia, unspecified: Secondary | ICD-10-CM

## 2018-03-25 DIAGNOSIS — M25571 Pain in right ankle and joints of right foot: Secondary | ICD-10-CM | POA: Diagnosis not present

## 2018-03-25 DIAGNOSIS — D473 Essential (hemorrhagic) thrombocythemia: Secondary | ICD-10-CM

## 2018-03-25 DIAGNOSIS — M25572 Pain in left ankle and joints of left foot: Secondary | ICD-10-CM | POA: Diagnosis not present

## 2018-03-25 DIAGNOSIS — D630 Anemia in neoplastic disease: Secondary | ICD-10-CM

## 2018-03-25 DIAGNOSIS — M19071 Primary osteoarthritis, right ankle and foot: Secondary | ICD-10-CM | POA: Diagnosis not present

## 2018-03-25 DIAGNOSIS — M79641 Pain in right hand: Secondary | ICD-10-CM | POA: Diagnosis not present

## 2018-03-25 LAB — CBC WITH DIFFERENTIAL/PLATELET
Basophils Absolute: 0 10*3/uL (ref 0.0–0.1)
Basophils Relative: 0 %
EOS ABS: 0 10*3/uL (ref 0.0–0.5)
EOS PCT: 1 %
HCT: 33.5 % — ABNORMAL LOW (ref 34.8–46.6)
Hemoglobin: 10.8 g/dL — ABNORMAL LOW (ref 11.6–15.9)
LYMPHS ABS: 2 10*3/uL (ref 0.9–3.3)
Lymphocytes Relative: 38 %
MCH: 29.2 pg (ref 25.1–34.0)
MCHC: 32.2 g/dL (ref 31.5–36.0)
MCV: 90.5 fL (ref 79.5–101.0)
MONO ABS: 0.5 10*3/uL (ref 0.1–0.9)
Monocytes Relative: 9 %
Neutro Abs: 2.7 10*3/uL (ref 1.5–6.5)
Neutrophils Relative %: 52 %
Platelets: 446 10*3/uL — ABNORMAL HIGH (ref 145–400)
RBC: 3.71 MIL/uL (ref 3.70–5.45)
RDW: 20.7 % — AB (ref 11.2–14.5)
WBC: 5.2 10*3/uL (ref 3.9–10.3)

## 2018-03-25 MED ORDER — ANAGRELIDE HCL 1 MG PO CAPS: 1.0000 mg | ORAL_CAPSULE | Freq: Every day | ORAL | 3 refills | Status: DC

## 2018-03-25 NOTE — Telephone Encounter (Signed)
Gave avs and calendar ° °

## 2018-03-26 ENCOUNTER — Encounter: Payer: Self-pay | Admitting: Hematology and Oncology

## 2018-03-26 DIAGNOSIS — Z9114 Patient's other noncompliance with medication regimen: Secondary | ICD-10-CM | POA: Insufficient documentation

## 2018-03-26 NOTE — Assessment & Plan Note (Signed)
She has mild intermittent anemia, could be related to her underlying bone marrow disease or other causes I plan to check serum vitamin B12 and iron studies in the next visit

## 2018-03-26 NOTE — Assessment & Plan Note (Signed)
She has documented medical noncompliance On review of her chart, it is noted that the patient has major psychiatric disorder and is receiving treatment for this I reinforced the importance of keeping her appointment as scheduled

## 2018-03-26 NOTE — Assessment & Plan Note (Signed)
The patient was lost to follow-up again last year She has documented multiple no shows and noncompliance We discussed the importance of compliance in the setting of myeloproliferative disorder and risk of thrombosis without adequate treatment The patient has recently retired and has promised to become compliant again I refilled her prescription anagrelide and plan to see her back in 3 months She is reminded to take aspirin therapy

## 2018-03-26 NOTE — Progress Notes (Signed)
[place] OFFICE PROGRESS NOTE  Patient Care Team: Darcus Austin, MD as PCP - General (Family Medicine)  ASSESSMENT & PLAN:  Essential thrombocythemia The patient was lost to follow-up again last year She has documented multiple no shows and noncompliance We discussed the importance of compliance in the setting of myeloproliferative disorder and risk of thrombosis without adequate treatment The patient has recently retired and has promised to become compliant again I refilled her prescription anagrelide and plan to see her back in 3 months She is reminded to take aspirin therapy  Anemia in neoplastic disease She has mild intermittent anemia, could be related to her underlying bone marrow disease or other causes I plan to check serum vitamin B12 and iron studies in the next visit  Noncompliance with medication regimen She has documented medical noncompliance On review of her chart, it is noted that the patient has major psychiatric disorder and is receiving treatment for this I reinforced the importance of keeping her appointment as scheduled   Orders Placed This Encounter  Procedures  . Ferritin    Standing Status:   Future    Standing Expiration Date:   03/25/2019  . Iron and TIBC    Standing Status:   Future    Standing Expiration Date:   04/29/2019  . CBC with Differential/Platelet    Standing Status:   Future    Standing Expiration Date:   04/29/2019  . Vitamin B12    Standing Status:   Future    Standing Expiration Date:   04/30/2019  . Sedimentation rate    Standing Status:   Future    Standing Expiration Date:   04/30/2019    INTERVAL HISTORY: Please see below for problem oriented charting. She returns for further follow-up She has not been seen for over a year She took anagrelide for 3 months when I saw her last year and then she was lost to follow-up due to multiple social issues She denies recent diagnosis of thrombosis The patient denies any  recent signs or symptoms of bleeding such as spontaneous epistaxis, hematuria or hematochezia.  SUMMARY OF ONCOLOGIC HISTORY:  I reviewed the patient's records extensive and collaborated the history with the patient. Summary of her history is as follows: This patient was originally diagnosed with myeloproliferative disorder approximately 10 years ago. According to her, she have chronic back pain. MRI showed abnormal bone marrow signal and she was subsequently referred to see an oncologist. Bone marrow biopsy dated 12/10/2004, confirm essential thrombocytosis. The patient was started on Hydroxyurea 1000 mg daily for 5 days and 1500 mg on Tuesdays and Fridays and anagrelide 0.5 mg twice daily. She has poor compliance which she attributed to major depression. She self discontinued medication for over 6 months in 2016 to remain on aspirin to prevent risk of blood clots. She has frequent sweats which she attributed to menopausal symptoms. The patient denies any recent signs or symptoms of bleeding such as spontaneous epistaxis, hematuria or hematochezia. On 02/01/2015, her treatment is switched to 0.5 mg of anagrelide daily along with aspirin In December 2017, anagrelide is increased to 1 mg daily along with aspirin From 2017-2019, she had intermittent treatment due to loss of follow-up and noncompliance  REVIEW OF SYSTEMS:   Constitutional: Denies fevers, chills or abnormal weight loss Eyes: Denies blurriness of vision Ears, nose, mouth, throat, and face: Denies mucositis or sore throat Respiratory: Denies cough, dyspnea or wheezes Cardiovascular: Denies palpitation, chest discomfort or lower extremity swelling Gastrointestinal:  Denies nausea, heartburn or change in bowel habits Skin: Denies abnormal skin rashes Lymphatics: Denies new lymphadenopathy or easy bruising Neurological:Denies numbness, tingling or new weaknesses Behavioral/Psych: Mood is stable, no new changes  All other systems were  reviewed with the patient and are negative.  I have reviewed the past medical history, past surgical history, social history and family history with the patient and they are unchanged from previous note.  ALLERGIES:  has No Known Allergies.  MEDICATIONS:  Current Outpatient Medications  Medication Sig Dispense Refill  . diphenhydramine-acetaminophen (TYLENOL PM) 25-500 MG TABS tablet Take 1 tablet by mouth at bedtime as needed.    . Multiple Vitamin (MULTIVITAMIN) tablet Take 1 tablet by mouth daily.    Marland Kitchen acetaminophen (TYLENOL) 500 MG tablet Take 500 mg by mouth every 6 (six) hours as needed (For pain.).    Marland Kitchen ALPRAZolam (XANAX) 0.5 MG tablet Take 0.25 mg by mouth daily as needed for anxiety.    Marland Kitchen anagrelide (AGRYLIN) 1 MG capsule Take 1 capsule (1 mg total) by mouth daily. 30 capsule 3  . aspirin 81 MG tablet Take 1 tablet (81 mg total) by mouth daily. 30 tablet 0  . lidocaine (LIDODERM) 5 % Place 1 patch onto the skin daily as needed (For pain.).   0  . Melatonin 5 MG TABS Take 5-10 mg by mouth at bedtime.     . metoprolol (LOPRESSOR) 50 MG tablet Take 50 mg by mouth 2 (two) times daily.    . valsartan-hydrochlorothiazide (DIOVAN-HCT) 160-25 MG tablet TK 1 T PO ONCE A DAY.  1   No current facility-administered medications for this visit.     PHYSICAL EXAMINATION: ECOG PERFORMANCE STATUS: 0 - Asymptomatic  Vitals:   03/25/18 0927  BP: (!) 159/92  Pulse: (!) 55  Resp: 18  Temp: 97.7 F (36.5 C)  SpO2: 100%   Filed Weights   03/25/18 0927  Weight: 189 lb 12.8 oz (86.1 kg)    GENERAL:alert, no distress and comfortable SKIN: skin color, texture, turgor are normal, no rashes or significant lesions EYES: normal, Conjunctiva are pink and non-injected, sclera clear OROPHARYNX:no exudate, no erythema and lips, buccal mucosa, and tongue normal  NECK: supple, thyroid normal size, non-tender, without nodularity LYMPH:  no palpable lymphadenopathy in the cervical, axillary or  inguinal LUNGS: clear to auscultation and percussion with normal breathing effort HEART: regular rate & rhythm and no murmurs and no lower extremity edema ABDOMEN:abdomen soft, non-tender and normal bowel sounds Musculoskeletal:no cyanosis of digits and no clubbing  NEURO: alert & oriented x 3 with fluent speech, no focal motor/sensory deficits  LABORATORY DATA:  I have reviewed the data as listed    Component Value Date/Time   NA 142 12/11/2015 1100   NA 141 11/02/2014 1308   K 4.1 12/11/2015 1100   K 3.8 11/02/2014 1308   CL 108 12/11/2015 1100   CL 106 10/06/2012 1002   CO2 30 12/11/2015 1100   CO2 24 11/02/2014 1308   GLUCOSE 101 (H) 12/11/2015 1100   GLUCOSE 99 11/02/2014 1308   GLUCOSE 104 (H) 10/06/2012 1002   BUN 16 12/11/2015 1100   BUN 9.8 11/02/2014 1308   CREATININE 1.09 (H) 12/13/2015 1102   CREATININE 0.9 11/02/2014 1308   CALCIUM 9.4 12/11/2015 1100   CALCIUM 9.2 11/02/2014 1308   PROT 7.1 11/02/2014 1308   ALBUMIN 4.0 11/02/2014 1308   AST 23 11/02/2014 1308   ALT 22 11/02/2014 1308   ALKPHOS 118 11/02/2014 1308  BILITOT 0.34 11/02/2014 1308   GFRNONAA 58 (L) 12/13/2015 1102   GFRAA >60 12/13/2015 1102    No results found for: SPEP, UPEP  Lab Results  Component Value Date   WBC 5.2 03/25/2018   NEUTROABS 2.7 03/25/2018   HGB 10.8 (L) 03/25/2018   HCT 33.5 (L) 03/25/2018   MCV 90.5 03/25/2018   PLT 446 (H) 03/25/2018      Chemistry      Component Value Date/Time   NA 142 12/11/2015 1100   NA 141 11/02/2014 1308   K 4.1 12/11/2015 1100   K 3.8 11/02/2014 1308   CL 108 12/11/2015 1100   CL 106 10/06/2012 1002   CO2 30 12/11/2015 1100   CO2 24 11/02/2014 1308   BUN 16 12/11/2015 1100   BUN 9.8 11/02/2014 1308   CREATININE 1.09 (H) 12/13/2015 1102   CREATININE 0.9 11/02/2014 1308      Component Value Date/Time   CALCIUM 9.4 12/11/2015 1100   CALCIUM 9.2 11/02/2014 1308   ALKPHOS 118 11/02/2014 1308   AST 23 11/02/2014 1308   ALT 22  11/02/2014 1308   BILITOT 0.34 11/02/2014 1308       All questions were answered. The patient knows to call the clinic with any problems, questions or concerns. No barriers to learning was detected.  I spent 15 minutes counseling the patient face to face. The total time spent in the appointment was 20 minutes and more than 50% was on counseling and review of test results  [name], MD 03/26/2018 8:47 AM

## 2018-04-08 DIAGNOSIS — M359 Systemic involvement of connective tissue, unspecified: Secondary | ICD-10-CM | POA: Diagnosis not present

## 2018-04-08 DIAGNOSIS — M064 Inflammatory polyarthropathy: Secondary | ICD-10-CM | POA: Diagnosis not present

## 2018-04-08 DIAGNOSIS — I10 Essential (primary) hypertension: Secondary | ICD-10-CM | POA: Diagnosis not present

## 2018-04-08 DIAGNOSIS — M1990 Unspecified osteoarthritis, unspecified site: Secondary | ICD-10-CM | POA: Diagnosis not present

## 2018-04-08 DIAGNOSIS — M79671 Pain in right foot: Secondary | ICD-10-CM | POA: Diagnosis not present

## 2018-06-17 DIAGNOSIS — I10 Essential (primary) hypertension: Secondary | ICD-10-CM | POA: Diagnosis not present

## 2018-06-17 DIAGNOSIS — R42 Dizziness and giddiness: Secondary | ICD-10-CM | POA: Diagnosis not present

## 2018-06-17 DIAGNOSIS — Z23 Encounter for immunization: Secondary | ICD-10-CM | POA: Diagnosis not present

## 2018-06-29 ENCOUNTER — Inpatient Hospital Stay: Payer: Federal, State, Local not specified - PPO | Admitting: Hematology and Oncology

## 2018-06-29 ENCOUNTER — Inpatient Hospital Stay: Payer: Federal, State, Local not specified - PPO | Attending: Hematology and Oncology

## 2018-06-29 DIAGNOSIS — D473 Essential (hemorrhagic) thrombocythemia: Secondary | ICD-10-CM | POA: Diagnosis not present

## 2018-06-29 DIAGNOSIS — E538 Deficiency of other specified B group vitamins: Secondary | ICD-10-CM | POA: Insufficient documentation

## 2018-06-29 DIAGNOSIS — D539 Nutritional anemia, unspecified: Secondary | ICD-10-CM

## 2018-06-29 DIAGNOSIS — Z7982 Long term (current) use of aspirin: Secondary | ICD-10-CM | POA: Insufficient documentation

## 2018-06-29 DIAGNOSIS — Z79899 Other long term (current) drug therapy: Secondary | ICD-10-CM | POA: Diagnosis not present

## 2018-06-29 DIAGNOSIS — D649 Anemia, unspecified: Secondary | ICD-10-CM

## 2018-06-29 LAB — CBC WITH DIFFERENTIAL/PLATELET
Abs Immature Granulocytes: 0.14 10*3/uL — ABNORMAL HIGH (ref 0.00–0.07)
Basophils Absolute: 0.1 10*3/uL (ref 0.0–0.1)
Basophils Relative: 1 %
EOS ABS: 0.1 10*3/uL (ref 0.0–0.5)
EOS PCT: 1 %
HCT: 37 % (ref 36.0–46.0)
HEMOGLOBIN: 11.7 g/dL — AB (ref 12.0–15.0)
IMMATURE GRANULOCYTES: 2 %
LYMPHS ABS: 2.1 10*3/uL (ref 0.7–4.0)
LYMPHS PCT: 35 %
MCH: 29.6 pg (ref 26.0–34.0)
MCHC: 31.6 g/dL (ref 30.0–36.0)
MCV: 93.7 fL (ref 80.0–100.0)
MONOS PCT: 7 %
Monocytes Absolute: 0.4 10*3/uL (ref 0.1–1.0)
Neutro Abs: 3.2 10*3/uL (ref 1.7–7.7)
Neutrophils Relative %: 54 %
Platelets: 484 10*3/uL — ABNORMAL HIGH (ref 150–400)
RBC: 3.95 MIL/uL (ref 3.87–5.11)
RDW: 20.1 % — AB (ref 11.5–15.5)
WBC: 6 10*3/uL (ref 4.0–10.5)
nRBC: 2.7 % — ABNORMAL HIGH (ref 0.0–0.2)

## 2018-06-29 LAB — FERRITIN: FERRITIN: 162 ng/mL (ref 11–307)

## 2018-06-29 LAB — SEDIMENTATION RATE: SED RATE: 20 mm/h (ref 0–22)

## 2018-06-29 LAB — IRON AND TIBC
Iron: 84 ug/dL (ref 41–142)
SATURATION RATIOS: 23 % (ref 21–57)
TIBC: 368 ug/dL (ref 236–444)
UIBC: 284 ug/dL (ref 120–384)

## 2018-06-29 LAB — VITAMIN B12: Vitamin B-12: 196 pg/mL (ref 180–914)

## 2018-06-29 MED ORDER — ANAGRELIDE HCL 1 MG PO CAPS: 1.0000 mg | ORAL_CAPSULE | Freq: Every day | ORAL | 3 refills | Status: DC

## 2018-06-30 ENCOUNTER — Encounter: Payer: Self-pay | Admitting: Hematology and Oncology

## 2018-06-30 ENCOUNTER — Other Ambulatory Visit: Payer: Self-pay | Admitting: Hematology and Oncology

## 2018-06-30 ENCOUNTER — Telehealth: Payer: Self-pay

## 2018-06-30 DIAGNOSIS — E538 Deficiency of other specified B group vitamins: Secondary | ICD-10-CM | POA: Insufficient documentation

## 2018-06-30 MED ORDER — VITAMIN B-12 1000 MCG PO TABS: 1000.0000 ug | ORAL_TABLET | Freq: Every day | ORAL | 6 refills | Status: AC

## 2018-06-30 NOTE — Progress Notes (Signed)
[place] OFFICE PROGRESS NOTE  Patient Care Team: Darcus Austin, MD as PCP - General (Family Medicine)  ASSESSMENT & PLAN:  Essential thrombocythemia The patient was lost to follow-up again last year She has documented multiple no shows and noncompliance We discussed the importance of compliance in the setting of myeloproliferative disorder and risk of thrombosis without adequate treatment The patient has recently retired and has promised to become compliant again I refilled her prescription anagrelide and plan to see her back in 3 months She is reminded to take aspirin therapy Even though her platelet count remains elevated, due to lack of symptoms, I prefer to keep her at the current dose without dose adjustment  Vitamin B12 deficiency She is anemic with borderline vitamin B12 deficiency I recommend B12 replacement therapy and plan to recheck in 3 months   Orders Placed This Encounter  Procedures  . Vitamin B12    Standing Status:   Future    Standing Expiration Date:   08/04/2019  . CBC with Differential/Platelet    Standing Status:   Future    Standing Expiration Date:   08/04/2019    INTERVAL HISTORY: Please see below for problem oriented charting. She returns for further follow-up She is compliant taking her medications as directed Denies recent infection, fever or chills The patient denies any recent signs or symptoms of bleeding such as spontaneous epistaxis, hematuria or hematochezia.   SUMMARY OF ONCOLOGIC HISTORY:  I reviewed the patient's records extensive and collaborated the history with the patient. Summary of her history is as follows: This patient was originally diagnosed with myeloproliferative disorder approximately 10 years ago. According to her, she have chronic back pain. MRI showed abnormal bone marrow signal and she was subsequently referred to see an oncologist. Bone marrow biopsy dated 12/10/2004, confirm essential thrombocytosis. The  patient was started on Hydroxyurea 1000 mg daily for 5 days and 1500 mg on Tuesdays and Fridays and anagrelide 0.5 mg twice daily. She has poor compliance which she attributed to major depression. She self discontinued medication for over 6 months in 2016 to remain on aspirin to prevent risk of blood clots. She has frequent sweats which she attributed to menopausal symptoms. The patient denies any recent signs or symptoms of bleeding such as spontaneous epistaxis, hematuria or hematochezia. On 02/01/2015, her treatment is switched to 0.5 mg of anagrelide daily along with aspirin In December 2017, anagrelide is increased to 1 mg daily along with aspirin From 2017-2019, she had intermittent treatment due to loss of follow-up and noncompliance  REVIEW OF SYSTEMS:   Constitutional: Denies fevers, chills or abnormal weight loss Eyes: Denies blurriness of vision Ears, nose, mouth, throat, and face: Denies mucositis or sore throat Respiratory: Denies cough, dyspnea or wheezes Cardiovascular: Denies palpitation, chest discomfort or lower extremity swelling Gastrointestinal:  Denies nausea, heartburn or change in bowel habits Skin: Denies abnormal skin rashes Lymphatics: Denies new lymphadenopathy or easy bruising Neurological:Denies numbness, tingling or new weaknesses Behavioral/Psych: Mood is stable, no new changes  All other systems were reviewed with the patient and are negative.  I have reviewed the past medical history, past surgical history, social history and family history with the patient and they are unchanged from previous note.  ALLERGIES:  has No Known Allergies.  MEDICATIONS:  Current Outpatient Medications  Medication Sig Dispense Refill  . losartan-hydrochlorothiazide (HYZAAR) 100-25 MG tablet Take 1 tablet by mouth daily.    Marland Kitchen acetaminophen (TYLENOL) 500 MG tablet Take 500 mg by mouth  every 6 (six) hours as needed (For pain.).    Marland Kitchen ALPRAZolam (XANAX) 0.5 MG tablet Take 0.25 mg  by mouth daily as needed for anxiety.    Marland Kitchen anagrelide (AGRYLIN) 1 MG capsule Take 1 capsule (1 mg total) by mouth daily. 30 capsule 3  . anagrelide (AGRYLIN) 1 MG capsule Take 1 capsule (1 mg total) by mouth daily. 30 capsule 3  . aspirin 81 MG tablet Take 1 tablet (81 mg total) by mouth daily. 30 tablet 0  . diphenhydramine-acetaminophen (TYLENOL PM) 25-500 MG TABS tablet Take 1 tablet by mouth at bedtime as needed.    . lidocaine (LIDODERM) 5 % Place 1 patch onto the skin daily as needed (For pain.).   0  . Melatonin 5 MG TABS Take 5-10 mg by mouth at bedtime.     . metoprolol (LOPRESSOR) 50 MG tablet Take 50 mg by mouth 2 (two) times daily.    . Multiple Vitamin (MULTIVITAMIN) tablet Take 1 tablet by mouth daily.     No current facility-administered medications for this visit.     PHYSICAL EXAMINATION: ECOG PERFORMANCE STATUS: 0 - Asymptomatic  Vitals:   06/29/18 1132  BP: 138/87  Pulse: 72  Resp: 18  Temp: 98.1 F (36.7 C)  SpO2: 100%   Filed Weights   06/29/18 1132  Weight: 187 lb 3.2 oz (84.9 kg)    GENERAL:alert, no distress and comfortable SKIN: skin color, texture, turgor are normal, no rashes or significant lesions EYES: normal, Conjunctiva are pink and non-injected, sclera clear OROPHARYNX:no exudate, no erythema and lips, buccal mucosa, and tongue normal  NECK: supple, thyroid normal size, non-tender, without nodularity LYMPH:  no palpable lymphadenopathy in the cervical, axillary or inguinal LUNGS: clear to auscultation and percussion with normal breathing effort HEART: regular rate & rhythm and no murmurs and no lower extremity edema ABDOMEN:abdomen soft, non-tender and normal bowel sounds Musculoskeletal:no cyanosis of digits and no clubbing  NEURO: alert & oriented x 3 with fluent speech, no focal motor/sensory deficits  LABORATORY DATA:  I have reviewed the data as listed    Component Value Date/Time   NA 142 12/11/2015 1100   NA 141 11/02/2014 1308    K 4.1 12/11/2015 1100   K 3.8 11/02/2014 1308   CL 108 12/11/2015 1100   CL 106 10/06/2012 1002   CO2 30 12/11/2015 1100   CO2 24 11/02/2014 1308   GLUCOSE 101 (H) 12/11/2015 1100   GLUCOSE 99 11/02/2014 1308   GLUCOSE 104 (H) 10/06/2012 1002   BUN 16 12/11/2015 1100   BUN 9.8 11/02/2014 1308   CREATININE 1.09 (H) 12/13/2015 1102   CREATININE 0.9 11/02/2014 1308   CALCIUM 9.4 12/11/2015 1100   CALCIUM 9.2 11/02/2014 1308   PROT 7.1 11/02/2014 1308   ALBUMIN 4.0 11/02/2014 1308   AST 23 11/02/2014 1308   ALT 22 11/02/2014 1308   ALKPHOS 118 11/02/2014 1308   BILITOT 0.34 11/02/2014 1308   GFRNONAA 58 (L) 12/13/2015 1102   GFRAA >60 12/13/2015 1102    No results found for: SPEP, UPEP  Lab Results  Component Value Date   WBC 6.0 06/29/2018   NEUTROABS 3.2 06/29/2018   HGB 11.7 (L) 06/29/2018   HCT 37.0 06/29/2018   MCV 93.7 06/29/2018   PLT 484 (H) 06/29/2018      Chemistry      Component Value Date/Time   NA 142 12/11/2015 1100   NA 141 11/02/2014 1308   K 4.1 12/11/2015 1100  K 3.8 11/02/2014 1308   CL 108 12/11/2015 1100   CL 106 10/06/2012 1002   CO2 30 12/11/2015 1100   CO2 24 11/02/2014 1308   BUN 16 12/11/2015 1100   BUN 9.8 11/02/2014 1308   CREATININE 1.09 (H) 12/13/2015 1102   CREATININE 0.9 11/02/2014 1308      Component Value Date/Time   CALCIUM 9.4 12/11/2015 1100   CALCIUM 9.2 11/02/2014 1308   ALKPHOS 118 11/02/2014 1308   AST 23 11/02/2014 1308   ALT 22 11/02/2014 1308   BILITOT 0.34 11/02/2014 1308      All questions were answered. The patient knows to call the clinic with any problems, questions or concerns. No barriers to learning was detected.  I spent 10 minutes counseling the patient face to face. The total time spent in the appointment was 15 minutes and more than 50% was on counseling and review of test results  [name], MD 06/30/2018 10:52 AM

## 2018-06-30 NOTE — Telephone Encounter (Signed)
Called and given below message. She verbalized understanding.  She would like to take a oral Rx and uses the [facility] on [address].

## 2018-06-30 NOTE — Telephone Encounter (Signed)
done

## 2018-06-30 NOTE — Assessment & Plan Note (Signed)
The patient was lost to follow-up again last year She has documented multiple no shows and noncompliance We discussed the importance of compliance in the setting of myeloproliferative disorder and risk of thrombosis without adequate treatment The patient has recently retired and has promised to become compliant again I refilled her prescription anagrelide and plan to see her back in 3 months She is reminded to take aspirin therapy Even though her platelet count remains elevated, due to lack of symptoms, I prefer to keep her at the current dose without dose adjustment

## 2018-06-30 NOTE — Assessment & Plan Note (Signed)
She is anemic with borderline vitamin B12 deficiency I recommend B12 replacement therapy and plan to recheck in 3 months

## 2018-06-30 NOTE — Telephone Encounter (Signed)
-----   Message from [name], MD sent at 06/30/2018  8:57 AM EST ----- Regarding: low B12 She has borderline low B12 I recommend consider B12 injection versus high dose oral; if oral, let me know which pharmacy. If injection, she needs to come in once a month Let me know what she decides and I can put order

## 2018-08-04 DIAGNOSIS — Z01818 Encounter for other preprocedural examination: Secondary | ICD-10-CM | POA: Diagnosis not present

## 2018-08-04 DIAGNOSIS — Z1211 Encounter for screening for malignant neoplasm of colon: Secondary | ICD-10-CM | POA: Diagnosis not present

## 2018-08-16 DIAGNOSIS — K5730 Diverticulosis of large intestine without perforation or abscess without bleeding: Secondary | ICD-10-CM | POA: Diagnosis not present

## 2018-08-16 DIAGNOSIS — Z1211 Encounter for screening for malignant neoplasm of colon: Secondary | ICD-10-CM | POA: Diagnosis not present

## 2018-08-16 DIAGNOSIS — K6389 Other specified diseases of intestine: Secondary | ICD-10-CM | POA: Diagnosis not present

## 2018-10-08 ENCOUNTER — Telehealth: Payer: Self-pay

## 2018-10-08 ENCOUNTER — Other Ambulatory Visit: Payer: Self-pay | Admitting: Hematology and Oncology

## 2018-10-08 NOTE — Telephone Encounter (Signed)
Move her appt to next Tuesday with labs I will see her first and discuss alternative

## 2018-10-08 NOTE — Telephone Encounter (Signed)
Pt running out of anagrelide.  Has 5 pills left.  Takes 1 daily.  Was previously on hydroxurea.   Sees you on 4/3. Please advse

## 2018-10-08 NOTE — Telephone Encounter (Signed)
I can see her at 1115 or 1145

## 2018-10-08 NOTE — Telephone Encounter (Signed)
Scheduled for 1045 labs and you at 1115.  Pt aware

## 2018-10-08 NOTE — Telephone Encounter (Signed)
What time next Tues can you see her?  Your schedule is blocked off.

## 2018-10-12 ENCOUNTER — Other Ambulatory Visit: Payer: Self-pay

## 2018-10-12 ENCOUNTER — Inpatient Hospital Stay (HOSPITAL_BASED_OUTPATIENT_CLINIC_OR_DEPARTMENT_OTHER): Payer: Federal, State, Local not specified - PPO | Admitting: Hematology and Oncology

## 2018-10-12 ENCOUNTER — Inpatient Hospital Stay: Payer: Federal, State, Local not specified - PPO | Attending: Hematology and Oncology

## 2018-10-12 VITALS — BP 154/89 | HR 60 | Temp 97.7°F | Resp 18 | Ht 70.0 in | Wt 192.2 lb

## 2018-10-12 DIAGNOSIS — E538 Deficiency of other specified B group vitamins: Secondary | ICD-10-CM | POA: Diagnosis not present

## 2018-10-12 DIAGNOSIS — Z7982 Long term (current) use of aspirin: Secondary | ICD-10-CM | POA: Insufficient documentation

## 2018-10-12 DIAGNOSIS — D630 Anemia in neoplastic disease: Secondary | ICD-10-CM

## 2018-10-12 DIAGNOSIS — D473 Essential (hemorrhagic) thrombocythemia: Secondary | ICD-10-CM

## 2018-10-12 DIAGNOSIS — D649 Anemia, unspecified: Secondary | ICD-10-CM | POA: Diagnosis not present

## 2018-10-12 DIAGNOSIS — Z79899 Other long term (current) drug therapy: Secondary | ICD-10-CM | POA: Diagnosis not present

## 2018-10-12 DIAGNOSIS — D539 Nutritional anemia, unspecified: Secondary | ICD-10-CM

## 2018-10-12 LAB — CBC WITH DIFFERENTIAL/PLATELET
Abs Immature Granulocytes: 0.12 10*3/uL — ABNORMAL HIGH (ref 0.00–0.07)
Basophils Absolute: 0.1 10*3/uL (ref 0.0–0.1)
Basophils Relative: 1 %
Eosinophils Absolute: 0 10*3/uL (ref 0.0–0.5)
Eosinophils Relative: 1 %
HCT: 35.5 % — ABNORMAL LOW (ref 36.0–46.0)
Hemoglobin: 10.8 g/dL — ABNORMAL LOW (ref 12.0–15.0)
Immature Granulocytes: 2 %
Lymphocytes Relative: 31 %
Lymphs Abs: 1.8 10*3/uL (ref 0.7–4.0)
MCH: 29.7 pg (ref 26.0–34.0)
MCHC: 30.4 g/dL (ref 30.0–36.0)
MCV: 97.5 fL (ref 80.0–100.0)
Monocytes Absolute: 0.4 10*3/uL (ref 0.1–1.0)
Monocytes Relative: 8 %
Neutro Abs: 3.2 10*3/uL (ref 1.7–7.7)
Neutrophils Relative %: 57 %
Platelets: 452 10*3/uL — ABNORMAL HIGH (ref 150–400)
RBC: 3.64 MIL/uL — ABNORMAL LOW (ref 3.87–5.11)
RDW: 19.1 % — ABNORMAL HIGH (ref 11.5–15.5)
WBC: 5.6 10*3/uL (ref 4.0–10.5)
nRBC: 0.9 % — ABNORMAL HIGH (ref 0.0–0.2)

## 2018-10-12 LAB — VITAMIN B12: Vitamin B-12: 697 pg/mL (ref 180–914)

## 2018-10-13 ENCOUNTER — Telehealth: Payer: Self-pay

## 2018-10-13 ENCOUNTER — Encounter: Payer: Self-pay | Admitting: Hematology and Oncology

## 2018-10-13 NOTE — Assessment & Plan Note (Signed)
She was found to have vitamin B12 deficiency recently Her repeat serum vitamin B12 was adequate She will continue oral replacement therapy

## 2018-10-13 NOTE — Telephone Encounter (Signed)
Called and given below message. She verbalized understanding.  She is unable to get Anagrelide Rx at [facility] and has taken her last tablet.

## 2018-10-13 NOTE — Assessment & Plan Note (Signed)
She is about to run out of her prescription anagrelide. She is not able to get her medication refilled due to back order Currently, she is not symptomatic Due to her anemia, I am not enthusiastic to start her back on hydroxyurea I will continue to help her get her medication refilled If she is not able to get anagrelide within the next 2 weeks, I will bring her back, recheck her blood work and consider starting her on hydroxyurea

## 2018-10-13 NOTE — Progress Notes (Signed)
[place] OFFICE PROGRESS NOTE  Patient Care Team: Darcus Austin, MD (Inactive) as PCP - General (Family Medicine)  ASSESSMENT & PLAN:  Essential thrombocythemia She is about to run out of her prescription anagrelide. She is not able to get her medication refilled due to back order Currently, she is not symptomatic Due to her anemia, I am not enthusiastic to start her back on hydroxyurea I will continue to help her get her medication refilled If she is not able to get anagrelide within the next 2 weeks, I will bring her back, recheck her blood work and consider starting her on hydroxyurea  Vitamin B12 deficiency She was found to have vitamin B12 deficiency recently Her repeat serum vitamin B12 was adequate She will continue oral replacement therapy  Anemia in neoplastic disease She has multifactorial anemia.  I plan to check her kidney function and iron studies in her next visit.   Orders Placed This Encounter  Procedures  . Ferritin    Standing Status:   Future    Standing Expiration Date:   10/13/2019  . Iron and TIBC    Standing Status:   Future    Standing Expiration Date:   11/17/2019  . CBC with Differential/Platelet    Standing Status:   Future    Standing Expiration Date:   11/17/2019  . Sedimentation rate    Standing Status:   Future    Standing Expiration Date:   11/17/2019  . Reticulocytes ([place])    Standing Status:   Future    Standing Expiration Date:   11/17/2019  . Comprehensive metabolic panel    Standing Status:   Future    Standing Expiration Date:   11/17/2019    INTERVAL HISTORY: Please see below for problem oriented charting. She returns to be seen urgently due to her medication running out The patient has been compliant taking all her medications as directed She denies recent infection, fever or chills No recent bleeding She is compliant taking vitamin B12 as instructed over the last 3 months The patient denies any recent signs or  symptoms of bleeding such as spontaneous epistaxis, hematuria or hematochezia.   SUMMARY OF ONCOLOGIC HISTORY:  I reviewed the patient's records extensive and collaborated the history with the patient. Summary of her history is as follows: This patient was originally diagnosed with myeloproliferative disorder approximately 10 years ago. According to her, she have chronic back pain. MRI showed abnormal bone marrow signal and she was subsequently referred to see an oncologist. Bone marrow biopsy dated 12/10/2004, confirm essential thrombocytosis. The patient was started on Hydroxyurea 1000 mg daily for 5 days and 1500 mg on Tuesdays and Fridays and anagrelide 0.5 mg twice daily. She has poor compliance which she attributed to major depression. She self discontinued medication for over 6 months in 2016 to remain on aspirin to prevent risk of blood clots. She has frequent sweats which she attributed to menopausal symptoms. The patient denies any recent signs or symptoms of bleeding such as spontaneous epistaxis, hematuria or hematochezia. On 02/01/2015, her treatment is switched to 0.5 mg of anagrelide daily along with aspirin In December 2017, anagrelide is increased to 1 mg daily along with aspirin From 2017-2019, she had intermittent treatment due to loss of follow-up and noncompliance  REVIEW OF SYSTEMS:   Constitutional: Denies fevers, chills or abnormal weight loss Eyes: Denies blurriness of vision Ears, nose, mouth, throat, and face: Denies mucositis or sore throat Respiratory: Denies cough, dyspnea or wheezes Cardiovascular:  Denies palpitation, chest discomfort or lower extremity swelling Gastrointestinal:  Denies nausea, heartburn or change in bowel habits Skin: Denies abnormal skin rashes Lymphatics: Denies new lymphadenopathy or easy bruising Neurological:Denies numbness, tingling or new weaknesses Behavioral/Psych: Mood is stable, no new changes  All other systems were reviewed with  the patient and are negative.  I have reviewed the past medical history, past surgical history, social history and family history with the patient and they are unchanged from previous note.  ALLERGIES:  has No Known Allergies.  MEDICATIONS:  Current Outpatient Medications  Medication Sig Dispense Refill  . ALPRAZolam (XANAX) 0.5 MG tablet Take 0.25 mg by mouth daily as needed for anxiety.    Marland Kitchen anagrelide (AGRYLIN) 1 MG capsule Take 1 capsule (1 mg total) by mouth daily. 30 capsule 3  . aspirin 81 MG tablet Take 1 tablet (81 mg total) by mouth daily. 30 tablet 0  . diphenhydramine-acetaminophen (TYLENOL PM) 25-500 MG TABS tablet Take 1 tablet by mouth at bedtime as needed.    Marland Kitchen losartan-hydrochlorothiazide (HYZAAR) 100-25 MG tablet Take 1 tablet by mouth daily.    . metoprolol (LOPRESSOR) 50 MG tablet Take 50 mg by mouth 2 (two) times daily.    . Multiple Vitamin (MULTIVITAMIN) tablet Take 1 tablet by mouth daily.    . vitamin B-12 (CYANOCOBALAMIN) 1000 MCG tablet Take 1 tablet (1,000 mcg total) by mouth daily. 30 tablet 6  . anagrelide (AGRYLIN) 1 MG capsule Take 1 capsule (1 mg total) by mouth daily. 30 capsule 3   No current facility-administered medications for this visit.     PHYSICAL EXAMINATION: ECOG PERFORMANCE STATUS: 0 - Asymptomatic  Vitals:   10/12/18 1148  BP: (!) 154/89  Pulse: 60  Resp: 18  Temp: 97.7 F (36.5 C)  SpO2: 100%   Filed Weights   10/12/18 1148  Weight: 192 lb 3.2 oz (87.2 kg)    GENERAL:alert, no distress and comfortable SKIN: skin color, texture, turgor are normal, no rashes or significant lesions NEURO: alert & oriented x 3 with fluent speech, no focal motor/sensory deficits  LABORATORY DATA:  I have reviewed the data as listed    Component Value Date/Time   NA 142 12/11/2015 1100   NA 141 11/02/2014 1308   K 4.1 12/11/2015 1100   K 3.8 11/02/2014 1308   CL 108 12/11/2015 1100   CL 106 10/06/2012 1002   CO2 30 12/11/2015 1100   CO2 24  11/02/2014 1308   GLUCOSE 101 (H) 12/11/2015 1100   GLUCOSE 99 11/02/2014 1308   GLUCOSE 104 (H) 10/06/2012 1002   BUN 16 12/11/2015 1100   BUN 9.8 11/02/2014 1308   CREATININE 1.09 (H) 12/13/2015 1102   CREATININE 0.9 11/02/2014 1308   CALCIUM 9.4 12/11/2015 1100   CALCIUM 9.2 11/02/2014 1308   PROT 7.1 11/02/2014 1308   ALBUMIN 4.0 11/02/2014 1308   AST 23 11/02/2014 1308   ALT 22 11/02/2014 1308   ALKPHOS 118 11/02/2014 1308   BILITOT 0.34 11/02/2014 1308   GFRNONAA 58 (L) 12/13/2015 1102   GFRAA >60 12/13/2015 1102    No results found for: SPEP, UPEP  Lab Results  Component Value Date   WBC 5.6 10/12/2018   NEUTROABS 3.2 10/12/2018   HGB 10.8 (L) 10/12/2018   HCT 35.5 (L) 10/12/2018   MCV 97.5 10/12/2018   PLT 452 (H) 10/12/2018      Chemistry      Component Value Date/Time   NA 142 12/11/2015 1100  NA 141 11/02/2014 1308   K 4.1 12/11/2015 1100   K 3.8 11/02/2014 1308   CL 108 12/11/2015 1100   CL 106 10/06/2012 1002   CO2 30 12/11/2015 1100   CO2 24 11/02/2014 1308   BUN 16 12/11/2015 1100   BUN 9.8 11/02/2014 1308   CREATININE 1.09 (H) 12/13/2015 1102   CREATININE 0.9 11/02/2014 1308      Component Value Date/Time   CALCIUM 9.4 12/11/2015 1100   CALCIUM 9.2 11/02/2014 1308   ALKPHOS 118 11/02/2014 1308   AST 23 11/02/2014 1308   ALT 22 11/02/2014 1308   BILITOT 0.34 11/02/2014 1308      All questions were answered. The patient knows to call the clinic with any problems, questions or concerns. No barriers to learning was detected.  I spent 15 minutes counseling the patient face to face. The total time spent in the appointment was 20 minutes and more than 50% was on counseling and review of test results  [name], MD 10/13/2018 7:47 AM

## 2018-10-13 NOTE — Telephone Encounter (Signed)
-----   Message from [name], MD sent at 10/13/2018  7:23 AM EDT ----- Regarding: B12 level is good Call her and let her know B12 is better Continue the same

## 2018-10-13 NOTE — Assessment & Plan Note (Signed)
She has multifactorial anemia.  I plan to check her kidney function and iron studies in her next visit.

## 2018-10-18 ENCOUNTER — Encounter: Payer: Self-pay | Admitting: Hematology and Oncology

## 2018-10-29 ENCOUNTER — Ambulatory Visit: Payer: Self-pay | Admitting: Hematology and Oncology

## 2018-10-29 ENCOUNTER — Other Ambulatory Visit: Payer: Self-pay

## 2018-11-12 ENCOUNTER — Other Ambulatory Visit: Payer: Self-pay | Admitting: Hematology and Oncology

## 2018-11-12 ENCOUNTER — Telehealth: Payer: Self-pay

## 2018-11-12 DIAGNOSIS — D473 Essential (hemorrhagic) thrombocythemia: Secondary | ICD-10-CM

## 2018-11-12 MED ORDER — HYDROXYUREA 500 MG PO CAPS: 500.0000 mg | ORAL_CAPSULE | Freq: Every day | ORAL | 1 refills | Status: DC

## 2018-11-12 NOTE — Telephone Encounter (Signed)
-----   Message from [name], MD sent at 11/12/2018  8:46 AM EDT ----- Regarding: anagrelide Can you all and see if she was able to get more refill? If not, when would she run out If she is running out, is she willing to be switched to Hydrea?

## 2018-11-12 NOTE — Telephone Encounter (Signed)
I sent to her pharmacy Start tomorrow I will send scheduling msg to see her in about 10 days

## 2018-11-12 NOTE — Telephone Encounter (Signed)
Called and given below message. She verbalized understanding. 

## 2018-11-12 NOTE — Telephone Encounter (Signed)
Called and given below message. She verbalized understanding.  She unable to find the Anagrelide Rx at the pharmacy. She has one tablet left. She is willing to switch to Hydrea and uses [facility] on [address].

## 2018-11-15 ENCOUNTER — Telehealth: Payer: Self-pay | Admitting: Hematology and Oncology

## 2018-11-15 NOTE — Telephone Encounter (Signed)
Left message for patient re 4/28 appointments. Schedule mailed.

## 2018-11-17 DIAGNOSIS — J705 Respiratory conditions due to smoke inhalation: Secondary | ICD-10-CM | POA: Diagnosis not present

## 2018-11-22 ENCOUNTER — Telehealth: Payer: Self-pay
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
## 2018-11-23 ENCOUNTER — Inpatient Hospital Stay
Payer: Federal, State, Local not specified - PPO | Attending: Hematology and Oncology | Admitting: Hematology and Oncology

## 2018-11-23 ENCOUNTER — Other Ambulatory Visit: Payer: Self-pay

## 2018-11-23 ENCOUNTER — Encounter: Payer: Self-pay | Admitting: Hematology and Oncology

## 2018-11-23 ENCOUNTER — Inpatient Hospital Stay: Payer: Federal, State, Local not specified - PPO

## 2018-11-23 DIAGNOSIS — N182 Chronic kidney disease, stage 2 (mild): Secondary | ICD-10-CM | POA: Diagnosis not present

## 2018-11-23 DIAGNOSIS — D539 Nutritional anemia, unspecified: Secondary | ICD-10-CM

## 2018-11-23 DIAGNOSIS — M549 Dorsalgia, unspecified: Secondary | ICD-10-CM

## 2018-11-23 DIAGNOSIS — G8929 Other chronic pain: Secondary | ICD-10-CM | POA: Insufficient documentation

## 2018-11-23 DIAGNOSIS — D473 Essential (hemorrhagic) thrombocythemia: Secondary | ICD-10-CM | POA: Diagnosis not present

## 2018-11-23 DIAGNOSIS — I129 Hypertensive chronic kidney disease with stage 1 through stage 4 chronic kidney disease, or unspecified chronic kidney disease: Secondary | ICD-10-CM | POA: Insufficient documentation

## 2018-11-23 DIAGNOSIS — Z7982 Long term (current) use of aspirin: Secondary | ICD-10-CM | POA: Insufficient documentation

## 2018-11-23 DIAGNOSIS — R42 Dizziness and giddiness: Secondary | ICD-10-CM | POA: Diagnosis not present

## 2018-11-23 DIAGNOSIS — D631 Anemia in chronic kidney disease: Secondary | ICD-10-CM | POA: Diagnosis not present

## 2018-11-23 DIAGNOSIS — Z79899 Other long term (current) drug therapy: Secondary | ICD-10-CM | POA: Diagnosis not present

## 2018-11-23 DIAGNOSIS — E538 Deficiency of other specified B group vitamins: Secondary | ICD-10-CM

## 2018-11-23 DIAGNOSIS — D630 Anemia in neoplastic disease: Secondary | ICD-10-CM | POA: Diagnosis not present

## 2018-11-23 DIAGNOSIS — I10 Essential (primary) hypertension: Secondary | ICD-10-CM

## 2018-11-23 LAB — COMPREHENSIVE METABOLIC PANEL
ALT: 14 U/L (ref 0–44)
AST: 16 U/L (ref 15–41)
Albumin: 4.3 g/dL (ref 3.5–5.0)
Alkaline Phosphatase: 81 U/L (ref 38–126)
Anion gap: 11 (ref 5–15)
BUN: 15 mg/dL (ref 6–20)
CO2: 24 mmol/L (ref 22–32)
Calcium: 9.6 mg/dL (ref 8.9–10.3)
Chloride: 104 mmol/L (ref 98–111)
Creatinine, Ser: 1.12 mg/dL — ABNORMAL HIGH (ref 0.44–1.00)
GFR calc Af Amer: >60 mL/min (ref >60)
GFR calc non Af Amer: 56 mL/min — ABNORMAL LOW (ref >60)
Glucose, Bld: 118 mg/dL — ABNORMAL HIGH (ref 70–99)
Potassium: 3.8 mmol/L (ref 3.5–5.1)
Sodium: 139 mmol/L (ref 135–145)
Total Bilirubin: 0.5 mg/dL (ref 0.3–1.2)
Total Protein: 7.7 g/dL (ref 6.5–8.1)

## 2018-11-23 LAB — CBC WITH DIFFERENTIAL/PLATELET
Abs Immature Granulocytes: 0.12 10*3/uL — ABNORMAL HIGH (ref 0.00–0.07)
Basophils Absolute: 0.1 10*3/uL (ref 0.0–0.1)
Basophils Relative: 1 %
Eosinophils Absolute: 0.1 10*3/uL (ref 0.0–0.5)
Eosinophils Relative: 1 %
HCT: 37 % (ref 36.0–46.0)
Hemoglobin: 11.9 g/dL — ABNORMAL LOW (ref 12.0–15.0)
Immature Granulocytes: 2 %
Lymphocytes Relative: 33 %
Lymphs Abs: 1.9 10*3/uL (ref 0.7–4.0)
MCH: 30.2 pg (ref 26.0–34.0)
MCHC: 32.2 g/dL (ref 30.0–36.0)
MCV: 93.9 fL (ref 80.0–100.0)
Monocytes Absolute: 0.3 10*3/uL (ref 0.1–1.0)
Monocytes Relative: 5 %
Neutro Abs: 3.4 10*3/uL (ref 1.7–7.7)
Neutrophils Relative %: 58 %
Platelets: 467 10*3/uL — ABNORMAL HIGH (ref 150–400)
RBC: 3.94 MIL/uL (ref 3.87–5.11)
RDW: 19 % — ABNORMAL HIGH (ref 11.5–15.5)
WBC: 5.9 10*3/uL (ref 4.0–10.5)
nRBC: 1.7 % — ABNORMAL HIGH (ref 0.0–0.2)

## 2018-11-23 LAB — IRON AND TIBC
Iron: 101 ug/dL (ref 41–142)
Saturation Ratios: 29 % (ref 21–57)
TIBC: 343 ug/dL (ref 236–444)
UIBC: 242 ug/dL (ref 120–384)

## 2018-11-23 LAB — FERRITIN: Ferritin: 187 ng/mL (ref 11–307)

## 2018-11-23 LAB — SEDIMENTATION RATE: Sed Rate: 20 mm/hr (ref 0–22)

## 2018-11-23 NOTE — Assessment & Plan Note (Signed)
She has some recent dizziness I suspect this is due to her blood pressure I recommend switching her diuretic therapy to take in the morning If she continues to have dizziness and low blood pressure, I recommend close follow-up with primary care doctor for medication adjustment

## 2018-11-23 NOTE — Assessment & Plan Note (Signed)
She tolerated hydroxyurea well with some dizziness which I do not believe is related to the side effects of treatment She will continue hydroxyurea due to national shortage of anagrelide I will see her back in 3 months for further follow-up

## 2018-11-23 NOTE — Progress Notes (Signed)
[place] OFFICE PROGRESS NOTE  Patient Care Team: Darcus Austin, MD (Inactive) as PCP - General (Family Medicine)  ASSESSMENT & PLAN:  Essential thrombocythemia She tolerated hydroxyurea well with some dizziness which I do not believe is related to the side effects of treatment She will continue hydroxyurea due to national shortage of anagrelide I will see her back in 3 months for further follow-up  Essential hypertension She has some recent dizziness I suspect this is due to her blood pressure I recommend switching her diuretic therapy to take in the morning If she continues to have dizziness and low blood pressure, I recommend close follow-up with primary care doctor for medication adjustment  Anemia in neoplastic disease She has multifactorial anemia. She has mild chronic kidney failure that is stable Hydroxyurea can certainly cause anemia We will observe only for now   No orders of the defined types were placed in this encounter.   INTERVAL HISTORY: Please see below for problem oriented charting. She returns for further follow-up She was not able to get supply of anagrelide She tolerated hydroxyurea well except for some mild dizziness recently The patient denies any recent signs or symptoms of bleeding such as spontaneous epistaxis, hematuria or hematochezia. She denies cough, chest pain or shortness of breath No recent infection  SUMMARY OF ONCOLOGIC HISTORY:  I reviewed the patient's records extensive and collaborated the history with the patient. Summary of her history is as follows: This patient was originally diagnosed with myeloproliferative disorder approximately 10 years ago. According to her, she have chronic back pain. MRI showed abnormal bone marrow signal and she was subsequently referred to see an oncologist. Bone marrow biopsy dated 12/10/2004, confirm essential thrombocytosis. The patient was started on Hydroxyurea 1000 mg daily for 5 days  and 1500 mg on Tuesdays and Fridays and anagrelide 0.5 mg twice daily. She has poor compliance which she attributed to major depression. She self discontinued medication for over 6 months in 2016 to remain on aspirin to prevent risk of blood clots. She has frequent sweats which she attributed to menopausal symptoms. The patient denies any recent signs or symptoms of bleeding such as spontaneous epistaxis, hematuria or hematochezia. On 02/01/2015, her treatment is switched to 0.5 mg of anagrelide daily along with aspirin In December 2017, anagrelide is increased to 1 mg daily along with aspirin From 2017-2019, she had intermittent treatment due to loss of follow-up and noncompliance And of April, anagrelide is stopped due to [employer] and she is prescribed hydroxyurea instead  REVIEW OF SYSTEMS:   Constitutional: Denies fevers, chills or abnormal weight loss Eyes: Denies blurriness of vision Ears, nose, mouth, throat, and face: Denies mucositis or sore throat Respiratory: Denies cough, dyspnea or wheezes Cardiovascular: Denies palpitation, chest discomfort or lower extremity swelling Gastrointestinal:  Denies nausea, heartburn or change in bowel habits Skin: Denies abnormal skin rashes Lymphatics: Denies new lymphadenopathy or easy bruising Neurological:Denies numbness, tingling or new weaknesses Behavioral/Psych: Mood is stable, no new changes  All other systems were reviewed with the patient and are negative.  I have reviewed the past medical history, past surgical history, social history and family history with the patient and they are unchanged from previous note.  ALLERGIES:  has No Known Allergies.  MEDICATIONS:  Current Outpatient Medications  Medication Sig Dispense Refill  . ALPRAZolam (XANAX) 0.5 MG tablet Take 0.25 mg by mouth daily as needed for anxiety.    Marland Kitchen anagrelide (AGRYLIN) 1 MG capsule Take 1 capsule (1 mg  total) by mouth daily. 30 capsule 3  . anagrelide  (AGRYLIN) 1 MG capsule Take 1 capsule (1 mg total) by mouth daily. 30 capsule 3  . aspirin 81 MG tablet Take 1 tablet (81 mg total) by mouth daily. 30 tablet 0  . diphenhydramine-acetaminophen (TYLENOL PM) 25-500 MG TABS tablet Take 1 tablet by mouth at bedtime as needed.    . hydroxyurea (HYDREA) 500 MG capsule Take 1 capsule (500 mg total) by mouth daily. May take with food to minimize GI side effects. 30 capsule 1  . losartan-hydrochlorothiazide (HYZAAR) 100-25 MG tablet Take 1 tablet by mouth daily.    . metoprolol (LOPRESSOR) 50 MG tablet Take 50 mg by mouth 2 (two) times daily.    . Multiple Vitamin (MULTIVITAMIN) tablet Take 1 tablet by mouth daily.    . vitamin B-12 (CYANOCOBALAMIN) 1000 MCG tablet Take 1 tablet (1,000 mcg total) by mouth daily. 30 tablet 6   No current facility-administered medications for this visit.     PHYSICAL EXAMINATION: ECOG PERFORMANCE STATUS: 1 - Symptomatic but completely ambulatory  Vitals:   11/23/18 1222  BP: 115/71  Pulse: 68  Resp: 18  Temp: 98.2 F (36.8 C)  SpO2: 100%   Filed Weights   11/23/18 1222  Weight: 188 lb 12.8 oz (85.6 kg)    GENERAL:alert, no distress and comfortable Musculoskeletal:no cyanosis of digits and no clubbing  NEURO: alert & oriented x 3 with fluent speech, no focal motor/sensory deficits  LABORATORY DATA:  I have reviewed the data as listed    Component Value Date/Time   NA 139 11/23/2018 1148   NA 141 11/02/2014 1308   K 3.8 11/23/2018 1148   K 3.8 11/02/2014 1308   CL 104 11/23/2018 1148   CL 106 10/06/2012 1002   CO2 24 11/23/2018 1148   CO2 24 11/02/2014 1308   GLUCOSE 118 (H) 11/23/2018 1148   GLUCOSE 99 11/02/2014 1308   GLUCOSE 104 (H) 10/06/2012 1002   BUN 15 11/23/2018 1148   BUN 9.8 11/02/2014 1308   CREATININE 1.12 (H) 11/23/2018 1148   CREATININE 0.9 11/02/2014 1308   CALCIUM 9.6 11/23/2018 1148   CALCIUM 9.2 11/02/2014 1308   PROT 7.7 11/23/2018 1148   PROT 7.1 11/02/2014 1308    ALBUMIN 4.3 11/23/2018 1148   ALBUMIN 4.0 11/02/2014 1308   AST 16 11/23/2018 1148   AST 23 11/02/2014 1308   ALT 14 11/23/2018 1148   ALT 22 11/02/2014 1308   ALKPHOS 81 11/23/2018 1148   ALKPHOS 118 11/02/2014 1308   BILITOT 0.5 11/23/2018 1148   BILITOT 0.34 11/02/2014 1308   GFRNONAA 56 (L) 11/23/2018 1148   GFRAA >60 11/23/2018 1148    No results found for: SPEP, UPEP  Lab Results  Component Value Date   WBC 5.9 11/23/2018   NEUTROABS 3.4 11/23/2018   HGB 11.9 (L) 11/23/2018   HCT 37.0 11/23/2018   MCV 93.9 11/23/2018   PLT 467 (H) 11/23/2018      Chemistry      Component Value Date/Time   NA 139 11/23/2018 1148   NA 141 11/02/2014 1308   K 3.8 11/23/2018 1148   K 3.8 11/02/2014 1308   CL 104 11/23/2018 1148   CL 106 10/06/2012 1002   CO2 24 11/23/2018 1148   CO2 24 11/02/2014 1308   BUN 15 11/23/2018 1148   BUN 9.8 11/02/2014 1308   CREATININE 1.12 (H) 11/23/2018 1148   CREATININE 0.9 11/02/2014 1308  Component Value Date/Time   CALCIUM 9.6 11/23/2018 1148   CALCIUM 9.2 11/02/2014 1308   ALKPHOS 81 11/23/2018 1148   ALKPHOS 118 11/02/2014 1308   AST 16 11/23/2018 1148   AST 23 11/02/2014 1308   ALT 14 11/23/2018 1148   ALT 22 11/02/2014 1308   BILITOT 0.5 11/23/2018 1148   BILITOT 0.34 11/02/2014 1308      All questions were answered. The patient knows to call the clinic with any problems, questions or concerns. No barriers to learning was detected.  I spent 15 minutes counseling the patient face to face. The total time spent in the appointment was 20 minutes and more than 50% was on counseling and review of test results  [name], MD 11/23/2018 1:03 PM

## 2018-11-23 NOTE — Assessment & Plan Note (Signed)
She has multifactorial anemia. She has mild chronic kidney failure that is stable Hydroxyurea can certainly cause anemia We will observe only for now

## 2018-11-24 ENCOUNTER — Telehealth: Payer: Self-pay | Admitting: Hematology and Oncology

## 2018-11-24 NOTE — Telephone Encounter (Signed)
Scheduled 44mth f/u per sch msg

## 2018-12-08 DIAGNOSIS — Z6828 Body mass index (BMI) 28.0-28.9, adult: Secondary | ICD-10-CM | POA: Diagnosis not present

## 2018-12-08 DIAGNOSIS — Z20828 Contact with and (suspected) exposure to other viral communicable diseases: Secondary | ICD-10-CM | POA: Diagnosis not present

## 2018-12-18 ENCOUNTER — Encounter (HOSPITAL_BASED_OUTPATIENT_CLINIC_OR_DEPARTMENT_OTHER): Payer: Self-pay

## 2018-12-18 ENCOUNTER — Emergency Department (HOSPITAL_BASED_OUTPATIENT_CLINIC_OR_DEPARTMENT_OTHER)
Admission: EM | Admit: 2018-12-18 | Discharge: 2018-12-18 | Disposition: A | Discharge status: Home / Self Care | Payer: Federal, State, Local not specified - PPO | Attending: Emergency Medicine | Admitting: Emergency Medicine

## 2018-12-18 ENCOUNTER — Other Ambulatory Visit: Payer: Self-pay

## 2018-12-18 ENCOUNTER — Emergency Department (HOSPITAL_BASED_OUTPATIENT_CLINIC_OR_DEPARTMENT_OTHER): Payer: Federal, State, Local not specified - PPO

## 2018-12-18 DIAGNOSIS — Z79899 Other long term (current) drug therapy: Secondary | ICD-10-CM | POA: Insufficient documentation

## 2018-12-18 DIAGNOSIS — I10 Essential (primary) hypertension: Secondary | ICD-10-CM | POA: Diagnosis not present

## 2018-12-18 DIAGNOSIS — M25572 Pain in left ankle and joints of left foot: Secondary | ICD-10-CM | POA: Insufficient documentation

## 2018-12-18 DIAGNOSIS — M7989 Other specified soft tissue disorders: Secondary | ICD-10-CM | POA: Diagnosis not present

## 2018-12-18 MED ORDER — DICLOFENAC SODIUM 1 % TD GEL: 2.0000 g | Freq: Four times a day (QID) | TRANSDERMAL | 0 refills | Status: DC

## 2018-12-18 NOTE — ED Triage Notes (Signed)
Pt c/o pain to L foot and ankle since Wednesday. Pt denies injury.

## 2018-12-18 NOTE — ED Provider Notes (Signed)
[facility] EMERGENCY DEPARTMENT Provider Note   CSN: [id number] Arrival date & time: 12/18/18  1311    History   Chief Complaint Chief Complaint  Patient presents with  . Foot Pain    HPI [name] is a 53 y.o. female.     The history is provided by the patient and medical records. No language interpreter was used.  Foot Pain    [name] is a 53 y.o. female who presents to the Emergency Department complaining of foot/ankle pain. She complains of foot and ankle pain and swelling that began about three days ago. No reports of trauma but she has been walking dogs. Pain is located in the anterior, medial and lateral ankle and shoots to her distal leg at times. Pain is worse with weight bearing, better with putting her leg up. No prior similar symptoms. She denies any fevers, chest pain, calf pain, difficulty breathing. She does have a history of hypertension and thrombocytothemia. Past Medical History:  Diagnosis Date  . Anxiety   . Depression   . Essential thrombocythemia ([place])   . Family history of adverse reaction to anesthesia    mother- slow to wake up   . GERD (gastroesophageal reflux disease)    hx of   . Headache   . Heart murmur    20 years ago   . Hypertension 11/10/14  . PTSD (post-traumatic stress disorder)     Patient Active Problem List   Diagnosis Date Noted  . Vitamin B12 deficiency 06/30/2018  . Noncompliance with medication regimen 03/26/2018  . Preventive measure 01/24/2016  . Rotator cuff tear 12/13/2015  . Essential hypertension 11/10/2014  . Anemia in neoplastic disease 11/02/2014  . GAD (generalized anxiety disorder) 04/22/2013  . Suicidal ideation 04/21/2013  . MDD (major depressive disorder), single episode, severe , no psychosis ([place]) 04/21/2013  . Essential thrombocythemia ([place]) 09/25/2011  . Deficiency anemia 09/25/2011    Past Surgical History:  Procedure Laterality Date  . CESAREAN SECTION    . SHOULDER  ARTHROSCOPY WITH SUBACROMIAL DECOMPRESSION AND OPEN ROTATOR C Left 12/13/2015   Procedure: LEFT SHOULDER ARTHROSCOPY WITH SUBACROMIAL DECOMPRESSION AND MINI ROTATOR CUFF REPAIR;  Surgeon: Susa Day, MD;  Location: [facility];  Service: Orthopedics;  Laterality: Left;  block     OB History   No obstetric history on file.      Home Medications    Prior to Admission medications   Medication Sig Start Date End Date Taking? Authorizing Provider  ALPRAZolam Duanne Moron) 0.5 MG tablet Take 0.25 mg by mouth daily as needed for anxiety.    [provider]  anagrelide (AGRYLIN) 1 MG capsule Take 1 capsule (1 mg total) by mouth daily. 03/25/18   [name], MD  anagrelide (AGRYLIN) 1 MG capsule Take 1 capsule (1 mg total) by mouth daily. 06/29/18   [name], MD  aspirin 81 MG tablet Take 1 tablet (81 mg total) by mouth daily. 04/25/13   Nena Polio T, PA-C  diclofenac sodium (VOLTAREN) 1 % GEL Apply 2 g topically 4 (four) times daily. 12/18/18   Quintella Reichert, MD  diphenhydramine-acetaminophen (TYLENOL PM) 25-500 MG TABS tablet Take 1 tablet by mouth at bedtime as needed.    [provider]  hydroxyurea (HYDREA) 500 MG capsule Take 1 capsule (500 mg total) by mouth daily. May take with food to minimize GI side effects. 11/12/18   [name], MD  losartan-hydrochlorothiazide (HYZAAR) 100-25 MG tablet Take 1 tablet by mouth daily.  10/17/15   [provider]  metoprolol (LOPRESSOR) 50 MG tablet Take 50 mg by mouth 2 (two) times daily.    [provider]  Multiple Vitamin (MULTIVITAMIN) tablet Take 1 tablet by mouth daily.    [provider]  vitamin B-12 (CYANOCOBALAMIN) 1000 MCG tablet Take 1 tablet (1,000 mcg total) by mouth daily. 06/30/18   [name], MD    Family History Family History  Problem Relation Age of Onset  . Depression Mother   . Alcohol abuse Father   . Schizophrenia Father   . Depression Brother   . Anxiety disorder Brother   .  Post-traumatic stress disorder Brother   . Anxiety disorder Brother   . Anxiety disorder Sister     Social History Social History   Tobacco Use  . Smoking status: Never Smoker  . Smokeless tobacco: Never Used  Substance Use Topics  . Alcohol use: Not Currently    Alcohol/week: 1.0 standard drinks    Types: 1 Shots of liquor per week  . Drug use: No     Allergies   Patient has no known allergies.   Review of Systems Review of Systems  All other systems reviewed and are negative.    Physical Exam Updated Vital Signs BP 121/73 (BP Location: Right Arm)   Pulse 70   Temp 98.2 F (36.8 C) (Oral)   Resp 20   Ht 5\' 10"  (1.778 m)   Wt 86.2 kg   SpO2 94%   BMI 27.26 kg/m   Physical Exam Vitals signs and nursing note reviewed.  Constitutional:      Appearance: She is well-developed.  HENT:     Head: Normocephalic and atraumatic.  Cardiovascular:     Rate and Rhythm: Normal rate and regular rhythm.  Pulmonary:     Effort: Pulmonary effort is normal. No respiratory distress.  Musculoskeletal:        General: No tenderness.     Comments: Trace swelling to left ankle without erythema.  There is mild tenderness to palpation to medial left ankle.  Pain with flexion/extension at the ankle. No knee, calf tenderness.  2+ DP pulses bilaterally.    Skin:    General: Skin is warm and dry.  Neurological:     Mental Status: She is alert and oriented to person, place, and time.  Psychiatric:        Mood and Affect: Mood normal.        Behavior: Behavior normal.      ED Treatments / Results  Labs (all labs ordered are listed, but only abnormal results are displayed) Labs Reviewed - No data to display  EKG None  Radiology Dg Ankle Complete Left  Result Date: 12/18/2018 CLINICAL DATA:  Pain and swelling EXAM: LEFT ANKLE COMPLETE - 3+ VIEW COMPARISON:  March 25, 2018 FINDINGS: Frontal, oblique, and lateral views obtained. There is no fracture or joint effusion. There  is no appreciable joint space narrowing or erosion. There are posterior and inferior calcaneal spurs. There is spurring in the dorsal midfoot. Ankle mortise appears intact. IMPRESSION: Osteoarthritic change in the dorsal midfoot. There are calcaneal spurs. There is no appreciable joint space narrowing or erosion. No evident fracture. Ankle mortise appears intact. Electronically Signed   By: Lowella Grip III M.D.   On: 12/18/2018 14:41    Procedures Procedures (including critical care time)  Medications Ordered in ED Medications - No data to display   Initial Impression / Assessment and Plan / ED Course  I have reviewed the triage vital signs and the nursing notes.  Pertinent labs & imaging results that were available during my care of the patient were reviewed by me and considered in my medical decision making (see chart for details).       Patient presents for evaluation of atraumatic left ankle pain. She is non-toxic appearing on evaluation and well perfused. No evidence of acute fracture, dislocation. Exam is not consistent with gouty or septic arthritis. Counseled patient on home care for ankle pain. Discussed weight-bearing as tolerated. Discussed continuing acetaminophen according to label instructions. Will prescribe Voltaren gel. Discussed outpatient follow-up and return precautions.  Final Clinical Impressions(s) / ED Diagnoses   Final diagnoses:  Acute left ankle pain    ED Discharge Orders         Ordered    diclofenac sodium (VOLTAREN) 1 % GEL  4 times daily     12/18/18 1452           Quintella Reichert, MD 12/18/18 1454

## 2018-12-24 DIAGNOSIS — M25572 Pain in left ankle and joints of left foot: Secondary | ICD-10-CM | POA: Diagnosis not present

## 2019-01-16 ENCOUNTER — Other Ambulatory Visit: Payer: Self-pay | Admitting: Hematology and Oncology

## 2019-01-17 ENCOUNTER — Other Ambulatory Visit: Payer: Self-pay | Admitting: Hematology and Oncology

## 2019-01-17 ENCOUNTER — Other Ambulatory Visit: Payer: Self-pay

## 2019-01-17 MED ORDER — ANAGRELIDE HCL 1 MG PO CAPS: 1.0000 mg | ORAL_CAPSULE | Freq: Every day | ORAL | 3 refills | Status: DC

## 2019-01-20 DIAGNOSIS — M25572 Pain in left ankle and joints of left foot: Secondary | ICD-10-CM | POA: Diagnosis not present

## 2019-01-20 DIAGNOSIS — M79672 Pain in left foot: Secondary | ICD-10-CM | POA: Diagnosis not present

## 2019-01-20 DIAGNOSIS — M19072 Primary osteoarthritis, left ankle and foot: Secondary | ICD-10-CM | POA: Diagnosis not present

## 2019-02-11 DIAGNOSIS — Z20828 Contact with and (suspected) exposure to other viral communicable diseases: Secondary | ICD-10-CM | POA: Diagnosis not present

## 2019-02-22 ENCOUNTER — Other Ambulatory Visit: Payer: Federal, State, Local not specified - PPO

## 2019-02-22 ENCOUNTER — Ambulatory Visit: Payer: Federal, State, Local not specified - PPO | Admitting: Hematology and Oncology

## 2019-03-03 ENCOUNTER — Other Ambulatory Visit: Payer: Self-pay

## 2019-03-03 ENCOUNTER — Inpatient Hospital Stay: Payer: Federal, State, Local not specified - PPO

## 2019-03-03 ENCOUNTER — Other Ambulatory Visit: Payer: Self-pay | Admitting: Hematology and Oncology

## 2019-03-03 ENCOUNTER — Inpatient Hospital Stay
Payer: Federal, State, Local not specified - PPO | Attending: Hematology and Oncology | Admitting: Hematology and Oncology

## 2019-03-03 VITALS — BP 112/68 | HR 63 | Temp 98.0°F | Resp 18 | Ht 70.0 in | Wt 191.8 lb

## 2019-03-03 DIAGNOSIS — G8929 Other chronic pain: Secondary | ICD-10-CM | POA: Insufficient documentation

## 2019-03-03 DIAGNOSIS — Z79899 Other long term (current) drug therapy: Secondary | ICD-10-CM | POA: Diagnosis not present

## 2019-03-03 DIAGNOSIS — M549 Dorsalgia, unspecified: Secondary | ICD-10-CM | POA: Diagnosis not present

## 2019-03-03 DIAGNOSIS — D473 Essential (hemorrhagic) thrombocythemia: Secondary | ICD-10-CM

## 2019-03-03 DIAGNOSIS — Z7982 Long term (current) use of aspirin: Secondary | ICD-10-CM | POA: Diagnosis not present

## 2019-03-03 DIAGNOSIS — Z791 Long term (current) use of non-steroidal anti-inflammatories (NSAID): Secondary | ICD-10-CM | POA: Diagnosis not present

## 2019-03-03 LAB — CBC WITH DIFFERENTIAL/PLATELET
Abs Immature Granulocytes: 0.15 10*3/uL — ABNORMAL HIGH (ref 0.00–0.07)
Basophils Absolute: 0.1 10*3/uL (ref 0.0–0.1)
Basophils Relative: 1 %
Eosinophils Absolute: 0.1 10*3/uL (ref 0.0–0.5)
Eosinophils Relative: 1 %
HCT: 35 % — ABNORMAL LOW (ref 36.0–46.0)
Hemoglobin: 11 g/dL — ABNORMAL LOW (ref 12.0–15.0)
Immature Granulocytes: 2 %
Lymphocytes Relative: 32 %
Lymphs Abs: 2.1 10*3/uL (ref 0.7–4.0)
MCH: 31.3 pg (ref 26.0–34.0)
MCHC: 31.4 g/dL (ref 30.0–36.0)
MCV: 99.4 fL (ref 80.0–100.0)
Monocytes Absolute: 0.6 10*3/uL (ref 0.1–1.0)
Monocytes Relative: 9 %
Neutro Abs: 3.8 10*3/uL (ref 1.7–7.7)
Neutrophils Relative %: 55 %
Platelets: 499 10*3/uL — ABNORMAL HIGH (ref 150–400)
RBC: 3.52 MIL/uL — ABNORMAL LOW (ref 3.87–5.11)
RDW: 18.5 % — ABNORMAL HIGH (ref 11.5–15.5)
WBC: 6.7 10*3/uL (ref 4.0–10.5)
nRBC: 1.5 % — ABNORMAL HIGH (ref 0.0–0.2)

## 2019-03-03 MED ORDER — ANAGRELIDE HCL 1 MG PO CAPS: 1.0000 mg | ORAL_CAPSULE | Freq: Every day | ORAL | 3 refills | Status: DC

## 2019-03-03 MED ORDER — HYDROXYUREA 500 MG PO CAPS: ORAL_CAPSULE | ORAL | 9 refills | Status: DC

## 2019-03-04 ENCOUNTER — Encounter: Payer: Self-pay | Admitting: Hematology and Oncology

## 2019-03-04 NOTE — Progress Notes (Signed)
[facility] OFFICE PROGRESS NOTE  Patient Care Team: [name], MD as PCP - General (Family Medicine)  ASSESSMENT & PLAN:  Essential thrombocythemia I am concerned about her worsening thrombocytosis She stated she has been compliant taking medications as directed I recommend addition of hydroxyurea 3 times a week I will see her back next month for further follow-up She agreed with the plan of care She will continue aspirin therapy   No orders of the defined types were placed in this encounter.   INTERVAL HISTORY: Please see below for problem oriented charting. She returns for further follow-up She feels well She denies missing doses No recent infection, fever or chills No recent bleeding  SUMMARY OF ONCOLOGIC HISTORY:  I reviewed the patient's records extensive and collaborated the history with the patient. Summary of her history is as follows: This patient was originally diagnosed with myeloproliferative disorder approximately 10 years ago. According to her, she have chronic back pain. MRI showed abnormal bone marrow signal and she was subsequently referred to see an oncologist. Bone marrow biopsy dated 12/10/2004, confirm essential thrombocytosis. The patient was started on Hydroxyurea 1000 mg daily for 5 days and 1500 mg on Tuesdays and Fridays and anagrelide 0.5 mg twice daily. She has poor compliance which she attributed to major depression. She self discontinued medication for over 6 months in 2016 to remain on aspirin to prevent risk of blood clots. She has frequent sweats which she attributed to menopausal symptoms. The patient denies any recent signs or symptoms of bleeding such as spontaneous epistaxis, hematuria or hematochezia. On 02/01/2015, her treatment is switched to 0.5 mg of anagrelide daily along with aspirin In December 2017, anagrelide is increased to 1 mg daily along with aspirin From 2017-2019, she had intermittent treatment due to loss of  follow-up and noncompliance And of April, anagrelide is stopped due to [employer] and she is prescribed hydroxyurea instead 03/03/2019: Her treatment is modified by introducing hydroxyurea 3 times a week along with anagrelide 1 mg daily  REVIEW OF SYSTEMS:   Constitutional: Denies fevers, chills or abnormal weight loss Eyes: Denies blurriness of vision Ears, nose, mouth, throat, and face: Denies mucositis or sore throat Respiratory: Denies cough, dyspnea or wheezes Cardiovascular: Denies palpitation, chest discomfort or lower extremity swelling Gastrointestinal:  Denies nausea, heartburn or change in bowel habits Skin: Denies abnormal skin rashes Lymphatics: Denies new lymphadenopathy or easy bruising Neurological:Denies numbness, tingling or new weaknesses Behavioral/Psych: Mood is stable, no new changes  All other systems were reviewed with the patient and are negative.  I have reviewed the past medical history, past surgical history, social history and family history with the patient and they are unchanged from previous note.  ALLERGIES:  has No Known Allergies.  MEDICATIONS:  Current Outpatient Medications  Medication Sig Dispense Refill  . ALPRAZolam (XANAX) 0.5 MG tablet Take 0.25 mg by mouth daily as needed for anxiety.    Marland Kitchen anagrelide (AGRYLIN) 1 MG capsule Take 1 capsule (1 mg total) by mouth daily. 30 capsule 3  . aspirin 81 MG tablet Take 1 tablet (81 mg total) by mouth daily. 30 tablet 0  . diclofenac sodium (VOLTAREN) 1 % GEL Apply 2 g topically 4 (four) times daily. 1 Tube 0  . diphenhydramine-acetaminophen (TYLENOL PM) 25-500 MG TABS tablet Take 1 tablet by mouth at bedtime as needed.    . hydroxyurea (HYDREA) 500 MG capsule Take 1 on Mondays, Wednesdays and Fridays only, by mouth 12 capsule 9  .  losartan-hydrochlorothiazide (HYZAAR) 100-25 MG tablet Take 1 tablet by mouth daily.    . metoprolol (LOPRESSOR) 50 MG tablet Take 50 mg by mouth 2 (two) times daily.    .  Multiple Vitamin (MULTIVITAMIN) tablet Take 1 tablet by mouth daily.    . vitamin B-12 (CYANOCOBALAMIN) 1000 MCG tablet Take 1 tablet (1,000 mcg total) by mouth daily. 30 tablet 6   No current facility-administered medications for this visit.     PHYSICAL EXAMINATION: ECOG PERFORMANCE STATUS: 0 - Asymptomatic  Vitals:   03/03/19 1035  BP: 112/68  Pulse: 63  Resp: 18  Temp: 98 F (36.7 C)  SpO2: 100%   Filed Weights   03/03/19 1035  Weight: 191 lb 12.8 oz (87 kg)    GENERAL:alert, no distress and comfortable NEURO: alert & oriented x 3 with fluent speech, no focal motor/sensory deficits  LABORATORY DATA:  I have reviewed the data as listed    Component Value Date/Time   NA 139 11/23/2018 1148   NA 141 11/02/2014 1308   K 3.8 11/23/2018 1148   K 3.8 11/02/2014 1308   CL 104 11/23/2018 1148   CL 106 10/06/2012 1002   CO2 24 11/23/2018 1148   CO2 24 11/02/2014 1308   GLUCOSE 118 (H) 11/23/2018 1148   GLUCOSE 99 11/02/2014 1308   GLUCOSE 104 (H) 10/06/2012 1002   BUN 15 11/23/2018 1148   BUN 9.8 11/02/2014 1308   CREATININE 1.12 (H) 11/23/2018 1148   CREATININE 0.9 11/02/2014 1308   CALCIUM 9.6 11/23/2018 1148   CALCIUM 9.2 11/02/2014 1308   PROT 7.7 11/23/2018 1148   PROT 7.1 11/02/2014 1308   ALBUMIN 4.3 11/23/2018 1148   ALBUMIN 4.0 11/02/2014 1308   AST 16 11/23/2018 1148   AST 23 11/02/2014 1308   ALT 14 11/23/2018 1148   ALT 22 11/02/2014 1308   ALKPHOS 81 11/23/2018 1148   ALKPHOS 118 11/02/2014 1308   BILITOT 0.5 11/23/2018 1148   BILITOT 0.34 11/02/2014 1308   GFRNONAA 56 (L) 11/23/2018 1148   GFRAA >60 11/23/2018 1148    No results found for: SPEP, UPEP  Lab Results  Component Value Date   WBC 6.7 03/03/2019   NEUTROABS 3.8 03/03/2019   HGB 11.0 (L) 03/03/2019   HCT 35.0 (L) 03/03/2019   MCV 99.4 03/03/2019   PLT 499 (H) 03/03/2019      Chemistry      Component Value Date/Time   NA 139 11/23/2018 1148   NA 141 11/02/2014 1308   K  3.8 11/23/2018 1148   K 3.8 11/02/2014 1308   CL 104 11/23/2018 1148   CL 106 10/06/2012 1002   CO2 24 11/23/2018 1148   CO2 24 11/02/2014 1308   BUN 15 11/23/2018 1148   BUN 9.8 11/02/2014 1308   CREATININE 1.12 (H) 11/23/2018 1148   CREATININE 0.9 11/02/2014 1308      Component Value Date/Time   CALCIUM 9.6 11/23/2018 1148   CALCIUM 9.2 11/02/2014 1308   ALKPHOS 81 11/23/2018 1148   ALKPHOS 118 11/02/2014 1308   AST 16 11/23/2018 1148   AST 23 11/02/2014 1308   ALT 14 11/23/2018 1148   ALT 22 11/02/2014 1308   BILITOT 0.5 11/23/2018 1148   BILITOT 0.34 11/02/2014 1308      All questions were answered. The patient knows to call the clinic with any problems, questions or concerns. No barriers to learning was detected.  I spent 10 minutes counseling the patient face to face. The  total time spent in the appointment was 15 minutes and more than 50% was on counseling and review of test results  [name], MD 03/04/2019 3:07 PM

## 2019-03-04 NOTE — Assessment & Plan Note (Signed)
I am concerned about her worsening thrombocytosis She stated she has been compliant taking medications as directed I recommend addition of hydroxyurea 3 times a week I will see her back next month for further follow-up She agreed with the plan of care She will continue aspirin therapy

## 2019-03-23 ENCOUNTER — Telehealth: Payer: Self-pay | Admitting: Hematology and Oncology

## 2019-03-23 NOTE — Telephone Encounter (Signed)
I talk with patient regarding schedule  

## 2019-04-08 ENCOUNTER — Inpatient Hospital Stay
Payer: Federal, State, Local not specified - PPO | Attending: Hematology and Oncology | Admitting: Hematology and Oncology

## 2019-04-08 ENCOUNTER — Encounter: Payer: Self-pay | Admitting: Hematology and Oncology

## 2019-04-08 ENCOUNTER — Inpatient Hospital Stay: Payer: Federal, State, Local not specified - PPO

## 2019-04-08 ENCOUNTER — Other Ambulatory Visit: Payer: Self-pay

## 2019-04-08 DIAGNOSIS — Z791 Long term (current) use of non-steroidal anti-inflammatories (NSAID): Secondary | ICD-10-CM | POA: Insufficient documentation

## 2019-04-08 DIAGNOSIS — D630 Anemia in neoplastic disease: Secondary | ICD-10-CM

## 2019-04-08 DIAGNOSIS — N182 Chronic kidney disease, stage 2 (mild): Secondary | ICD-10-CM | POA: Diagnosis not present

## 2019-04-08 DIAGNOSIS — Z7982 Long term (current) use of aspirin: Secondary | ICD-10-CM | POA: Insufficient documentation

## 2019-04-08 DIAGNOSIS — Z79899 Other long term (current) drug therapy: Secondary | ICD-10-CM | POA: Diagnosis not present

## 2019-04-08 DIAGNOSIS — D473 Essential (hemorrhagic) thrombocythemia: Secondary | ICD-10-CM | POA: Insufficient documentation

## 2019-04-08 DIAGNOSIS — D649 Anemia, unspecified: Secondary | ICD-10-CM | POA: Diagnosis not present

## 2019-04-08 DIAGNOSIS — M549 Dorsalgia, unspecified: Secondary | ICD-10-CM | POA: Insufficient documentation

## 2019-04-08 LAB — CBC WITH DIFFERENTIAL/PLATELET
Abs Immature Granulocytes: 0.14 10*3/uL — ABNORMAL HIGH (ref 0.00–0.07)
Basophils Absolute: 0.1 10*3/uL (ref 0.0–0.1)
Basophils Relative: 2 %
Eosinophils Absolute: 0 10*3/uL (ref 0.0–0.5)
Eosinophils Relative: 1 %
HCT: 34.8 % — ABNORMAL LOW (ref 36.0–46.0)
Hemoglobin: 11.1 g/dL — ABNORMAL LOW (ref 12.0–15.0)
Immature Granulocytes: 3 %
Lymphocytes Relative: 38 %
Lymphs Abs: 1.8 10*3/uL (ref 0.7–4.0)
MCH: 31.6 pg (ref 26.0–34.0)
MCHC: 31.9 g/dL (ref 30.0–36.0)
MCV: 99.1 fL (ref 80.0–100.0)
Monocytes Absolute: 0.3 10*3/uL (ref 0.1–1.0)
Monocytes Relative: 7 %
Neutro Abs: 2.4 10*3/uL (ref 1.7–7.7)
Neutrophils Relative %: 49 %
Platelets: 466 10*3/uL — ABNORMAL HIGH (ref 150–400)
RBC: 3.51 MIL/uL — ABNORMAL LOW (ref 3.87–5.11)
RDW: 17.8 % — ABNORMAL HIGH (ref 11.5–15.5)
WBC: 4.7 10*3/uL (ref 4.0–10.5)
nRBC: 3.2 % — ABNORMAL HIGH (ref 0.0–0.2)

## 2019-04-08 NOTE — Assessment & Plan Note (Signed)
With recent addition of hydroxyurea, her platelet count is better controlled She has no signs of worsening anemia She stated she has been compliant taking medications as directed I recommend she continues taking hydroxyurea 3 times a week along with anagrelide daily She will continue aspirin therapy I will see her again in 3 months for further follow-up We discussed influenza vaccination but the patient declined

## 2019-04-08 NOTE — Progress Notes (Signed)
[facility] OFFICE PROGRESS NOTE  Patient Care Team: [name], MD as PCP - General (Family Medicine)  ASSESSMENT & PLAN:  Essential thrombocythemia With recent addition of hydroxyurea, her platelet count is better controlled She has no signs of worsening anemia She stated she has been compliant taking medications as directed I recommend she continues taking hydroxyurea 3 times a week along with anagrelide daily She will continue aspirin therapy I will see her again in 3 months for further follow-up We discussed influenza vaccination but the patient declined  Anemia in neoplastic disease She has multifactorial anemia. She has mild chronic kidney failure that is stable We will observe only for now   No orders of the defined types were placed in this encounter.   INTERVAL HISTORY: Please see below for problem oriented charting. She returns for further follow-up She denies recent missing doses No recent infection, fever or chills No bleeding.  SUMMARY OF ONCOLOGIC HISTORY:  I reviewed the patient's records extensive and collaborated the history with the patient. Summary of her history is as follows: This patient was originally diagnosed with myeloproliferative disorder approximately 10 years ago. According to her, she have chronic back pain. MRI showed abnormal bone marrow signal and she was subsequently referred to see an oncologist. Bone marrow biopsy dated 12/10/2004, confirm essential thrombocytosis. The patient was started on Hydroxyurea 1000 mg daily for 5 days and 1500 mg on Tuesdays and Fridays and anagrelide 0.5 mg twice daily. She has poor compliance which she attributed to major depression. She self discontinued medication for over 6 months in 2016 to remain on aspirin to prevent risk of blood clots. She has frequent sweats which she attributed to menopausal symptoms. The patient denies any recent signs or symptoms of bleeding such as spontaneous  epistaxis, hematuria or hematochezia. On 02/01/2015, her treatment is switched to 0.5 mg of anagrelide daily along with aspirin In December 2017, anagrelide is increased to 1 mg daily along with aspirin From 2017-2019, she had intermittent treatment due to loss of follow-up and noncompliance And of April, anagrelide is stopped due to [employer] and she is prescribed hydroxyurea instead 03/03/2019: Her treatment is modified by introducing hydroxyurea 3 times a week along with anagrelide 1 mg daily   REVIEW OF SYSTEMS:   Constitutional: Denies fevers, chills or abnormal weight loss Eyes: Denies blurriness of vision Ears, nose, mouth, throat, and face: Denies mucositis or sore throat Respiratory: Denies cough, dyspnea or wheezes Cardiovascular: Denies palpitation, chest discomfort or lower extremity swelling Gastrointestinal:  Denies nausea, heartburn or change in bowel habits Skin: Denies abnormal skin rashes Lymphatics: Denies new lymphadenopathy or easy bruising Neurological:Denies numbness, tingling or new weaknesses Behavioral/Psych: Mood is stable, no new changes  All other systems were reviewed with the patient and are negative.  I have reviewed the past medical history, past surgical history, social history and family history with the patient and they are unchanged from previous note.  ALLERGIES:  has No Known Allergies.  MEDICATIONS:  Current Outpatient Medications  Medication Sig Dispense Refill  . ALPRAZolam (XANAX) 0.5 MG tablet Take 0.25 mg by mouth daily as needed for anxiety.    Marland Kitchen anagrelide (AGRYLIN) 1 MG capsule Take 1 capsule (1 mg total) by mouth daily. 30 capsule 3  . aspirin 81 MG tablet Take 1 tablet (81 mg total) by mouth daily. 30 tablet 0  . diclofenac sodium (VOLTAREN) 1 % GEL Apply 2 g topically 4 (four) times daily. 1 Tube 0  .  diphenhydramine-acetaminophen (TYLENOL PM) 25-500 MG TABS tablet Take 1 tablet by mouth at bedtime as needed.    . hydroxyurea  (HYDREA) 500 MG capsule Take 1 on Mondays, Wednesdays and Fridays only, by mouth 12 capsule 9  . losartan-hydrochlorothiazide (HYZAAR) 100-25 MG tablet Take 1 tablet by mouth daily.    . metoprolol (LOPRESSOR) 50 MG tablet Take 50 mg by mouth 2 (two) times daily.    . Multiple Vitamin (MULTIVITAMIN) tablet Take 1 tablet by mouth daily.    . vitamin B-12 (CYANOCOBALAMIN) 1000 MCG tablet Take 1 tablet (1,000 mcg total) by mouth daily. 30 tablet 6   No current facility-administered medications for this visit.     PHYSICAL EXAMINATION: ECOG PERFORMANCE STATUS: 0 - Asymptomatic  Vitals:   04/08/19 1305  BP: 115/76  Pulse: 63  Resp: 18  Temp: 97.8 F (36.6 C)  SpO2: 100%   Filed Weights   04/08/19 1305  Weight: 197 lb 3.2 oz (89.4 kg)    GENERAL:alert, no distress and comfortable NEURO: alert & oriented x 3 with fluent speech, no focal motor/sensory deficits  LABORATORY DATA:  I have reviewed the data as listed    Component Value Date/Time   NA 139 11/23/2018 1148   NA 141 11/02/2014 1308   K 3.8 11/23/2018 1148   K 3.8 11/02/2014 1308   CL 104 11/23/2018 1148   CL 106 10/06/2012 1002   CO2 24 11/23/2018 1148   CO2 24 11/02/2014 1308   GLUCOSE 118 (H) 11/23/2018 1148   GLUCOSE 99 11/02/2014 1308   GLUCOSE 104 (H) 10/06/2012 1002   BUN 15 11/23/2018 1148   BUN 9.8 11/02/2014 1308   CREATININE 1.12 (H) 11/23/2018 1148   CREATININE 0.9 11/02/2014 1308   CALCIUM 9.6 11/23/2018 1148   CALCIUM 9.2 11/02/2014 1308   PROT 7.7 11/23/2018 1148   PROT 7.1 11/02/2014 1308   ALBUMIN 4.3 11/23/2018 1148   ALBUMIN 4.0 11/02/2014 1308   AST 16 11/23/2018 1148   AST 23 11/02/2014 1308   ALT 14 11/23/2018 1148   ALT 22 11/02/2014 1308   ALKPHOS 81 11/23/2018 1148   ALKPHOS 118 11/02/2014 1308   BILITOT 0.5 11/23/2018 1148   BILITOT 0.34 11/02/2014 1308   GFRNONAA 56 (L) 11/23/2018 1148   GFRAA >60 11/23/2018 1148    No results found for: SPEP, UPEP  Lab Results  Component  Value Date   WBC 4.7 04/08/2019   NEUTROABS 2.4 04/08/2019   HGB 11.1 (L) 04/08/2019   HCT 34.8 (L) 04/08/2019   MCV 99.1 04/08/2019   PLT 466 (H) 04/08/2019      Chemistry      Component Value Date/Time   NA 139 11/23/2018 1148   NA 141 11/02/2014 1308   K 3.8 11/23/2018 1148   K 3.8 11/02/2014 1308   CL 104 11/23/2018 1148   CL 106 10/06/2012 1002   CO2 24 11/23/2018 1148   CO2 24 11/02/2014 1308   BUN 15 11/23/2018 1148   BUN 9.8 11/02/2014 1308   CREATININE 1.12 (H) 11/23/2018 1148   CREATININE 0.9 11/02/2014 1308      Component Value Date/Time   CALCIUM 9.6 11/23/2018 1148   CALCIUM 9.2 11/02/2014 1308   ALKPHOS 81 11/23/2018 1148   ALKPHOS 118 11/02/2014 1308   AST 16 11/23/2018 1148   AST 23 11/02/2014 1308   ALT 14 11/23/2018 1148   ALT 22 11/02/2014 1308   BILITOT 0.5 11/23/2018 1148   BILITOT 0.34 11/02/2014 1308  All questions were answered. The patient knows to call the clinic with any problems, questions or concerns. No barriers to learning was detected.  I spent 10 minutes counseling the patient face to face. The total time spent in the appointment was 15 minutes and more than 50% was on counseling and review of test results  [name], MD 04/08/2019 1:33 PM

## 2019-04-08 NOTE — Assessment & Plan Note (Signed)
She has multifactorial anemia. She has mild chronic kidney failure that is stable We will observe only for now

## 2019-04-11 ENCOUNTER — Telehealth: Payer: Self-pay | Admitting: Hematology and Oncology

## 2019-04-11 NOTE — Telephone Encounter (Signed)
I talk with patient regarding schedule  

## 2019-05-13 DIAGNOSIS — Z20828 Contact with and (suspected) exposure to other viral communicable diseases: Secondary | ICD-10-CM | POA: Diagnosis not present

## 2019-05-13 DIAGNOSIS — J069 Acute upper respiratory infection, unspecified: Secondary | ICD-10-CM | POA: Diagnosis not present

## 2019-05-22 ENCOUNTER — Other Ambulatory Visit: Payer: Self-pay | Admitting: Hematology and Oncology

## 2019-06-30 DIAGNOSIS — Z20828 Contact with and (suspected) exposure to other viral communicable diseases: Secondary | ICD-10-CM | POA: Diagnosis not present

## 2019-07-08 ENCOUNTER — Encounter: Payer: Self-pay | Admitting: Hematology and Oncology

## 2019-07-08 ENCOUNTER — Other Ambulatory Visit: Payer: Self-pay

## 2019-07-08 ENCOUNTER — Inpatient Hospital Stay (HOSPITAL_BASED_OUTPATIENT_CLINIC_OR_DEPARTMENT_OTHER): Payer: Federal, State, Local not specified - PPO | Admitting: Hematology and Oncology

## 2019-07-08 ENCOUNTER — Ambulatory Visit: Payer: Federal, State, Local not specified - PPO | Admitting: Hematology and Oncology

## 2019-07-08 ENCOUNTER — Other Ambulatory Visit: Payer: Federal, State, Local not specified - PPO

## 2019-07-08 ENCOUNTER — Inpatient Hospital Stay: Payer: Federal, State, Local not specified - PPO | Attending: Hematology and Oncology

## 2019-07-08 VITALS — BP 116/77 | HR 79 | Temp 97.8°F | Resp 18 | Ht 70.0 in | Wt 196.3 lb

## 2019-07-08 DIAGNOSIS — F329 Major depressive disorder, single episode, unspecified: Secondary | ICD-10-CM | POA: Insufficient documentation

## 2019-07-08 DIAGNOSIS — M549 Dorsalgia, unspecified: Secondary | ICD-10-CM | POA: Insufficient documentation

## 2019-07-08 DIAGNOSIS — Z23 Encounter for immunization: Secondary | ICD-10-CM

## 2019-07-08 DIAGNOSIS — D473 Essential (hemorrhagic) thrombocythemia: Secondary | ICD-10-CM | POA: Diagnosis not present

## 2019-07-08 DIAGNOSIS — Z791 Long term (current) use of non-steroidal anti-inflammatories (NSAID): Secondary | ICD-10-CM | POA: Diagnosis not present

## 2019-07-08 DIAGNOSIS — Z7982 Long term (current) use of aspirin: Secondary | ICD-10-CM | POA: Insufficient documentation

## 2019-07-08 DIAGNOSIS — Z79899 Other long term (current) drug therapy: Secondary | ICD-10-CM | POA: Diagnosis not present

## 2019-07-08 DIAGNOSIS — G8929 Other chronic pain: Secondary | ICD-10-CM | POA: Diagnosis not present

## 2019-07-08 LAB — CBC WITH DIFFERENTIAL/PLATELET
Abs Immature Granulocytes: 0.13 10*3/uL — ABNORMAL HIGH (ref 0.00–0.07)
Basophils Absolute: 0.1 10*3/uL (ref 0.0–0.1)
Basophils Relative: 1 %
Eosinophils Absolute: 0 10*3/uL (ref 0.0–0.5)
Eosinophils Relative: 1 %
HCT: 36 % (ref 36.0–46.0)
Hemoglobin: 11.5 g/dL — ABNORMAL LOW (ref 12.0–15.0)
Immature Granulocytes: 2 %
Lymphocytes Relative: 40 %
Lymphs Abs: 2.4 10*3/uL (ref 0.7–4.0)
MCH: 30.7 pg (ref 26.0–34.0)
MCHC: 31.9 g/dL (ref 30.0–36.0)
MCV: 96 fL (ref 80.0–100.0)
Monocytes Absolute: 0.5 10*3/uL (ref 0.1–1.0)
Monocytes Relative: 8 %
Neutro Abs: 2.9 10*3/uL (ref 1.7–7.7)
Neutrophils Relative %: 48 %
Platelets: 446 10*3/uL — ABNORMAL HIGH (ref 150–400)
RBC: 3.75 MIL/uL — ABNORMAL LOW (ref 3.87–5.11)
RDW: 18.6 % — ABNORMAL HIGH (ref 11.5–15.5)
WBC: 6 10*3/uL (ref 4.0–10.5)
nRBC: 3.3 % — ABNORMAL HIGH (ref 0.0–0.2)

## 2019-07-08 MED ORDER — INFLUENZA VAC SPLIT QUAD 0.5 ML IM SUSY
0.5000 mL | PREFILLED_SYRINGE | Freq: Once | INTRAMUSCULAR | Status: AC
  Administered 2019-07-08: 12:00:00 0.5 mL via INTRAMUSCULAR

## 2019-07-08 MED ORDER — INFLUENZA VAC SPLIT QUAD 0.5 ML IM SUSY
PREFILLED_SYRINGE | INTRAMUSCULAR | Status: AC
  Filled 2019-07-08: qty 0.5

## 2019-07-08 NOTE — Progress Notes (Signed)
[place] OFFICE PROGRESS NOTE  Patient Care Team: [name], MD as PCP - General (Family Medicine)  ASSESSMENT & PLAN:  Essential thrombocythemia With recent addition of hydroxyurea, her platelet count is better controlled She has no signs of worsening anemia She stated she has been compliant taking medications as directed I recommend she continues taking hydroxyurea 3 times a week along with anagrelide daily She will continue aspirin therapy I will see her again in 4 months for further follow-up Previously, we discussed influenza vaccination but she did not receive it She is in agreement to receive influenza vaccination today   No orders of the defined types were placed in this encounter.   INTERVAL HISTORY: Please see below for problem oriented charting. She returns for further follow-up She missed several doses of anagrelide due to difficulties getting refills recently Otherwise, she tolerated treatment well No recent infection, fever or chills No recent bleeding  SUMMARY OF ONCOLOGIC HISTORY:  I reviewed the patient's records extensive and collaborated the history with the patient. Summary of her history is as follows: This patient was originally diagnosed with myeloproliferative disorder approximately 10 years ago. According to her, she have chronic back pain. MRI showed abnormal bone marrow signal and she was subsequently referred to see an oncologist. Bone marrow biopsy dated 12/10/2004, confirm essential thrombocytosis. The patient was started on Hydroxyurea 1000 mg daily for 5 days and 1500 mg on Tuesdays and Fridays and anagrelide 0.5 mg twice daily. She has poor compliance which she attributed to major depression. She self discontinued medication for over 6 months in 2016 to remain on aspirin to prevent risk of blood clots. She has frequent sweats which she attributed to menopausal symptoms. The patient denies any recent signs or symptoms of bleeding  such as spontaneous epistaxis, hematuria or hematochezia. On 02/01/2015, her treatment is switched to 0.5 mg of anagrelide daily along with aspirin In December 2017, anagrelide is increased to 1 mg daily along with aspirin From 2017-2019, she had intermittent treatment due to loss of follow-up and noncompliance And of April, anagrelide is stopped due to [employer] and she is prescribed hydroxyurea instead 03/03/2019: Her treatment is modified by introducing hydroxyurea 3 times a week along with anagrelide 1 mg daily  REVIEW OF SYSTEMS:   Constitutional: Denies fevers, chills or abnormal weight loss Eyes: Denies blurriness of vision Ears, nose, mouth, throat, and face: Denies mucositis or sore throat Respiratory: Denies cough, dyspnea or wheezes Cardiovascular: Denies palpitation, chest discomfort or lower extremity swelling Gastrointestinal:  Denies nausea, heartburn or change in bowel habits Skin: Denies abnormal skin rashes Lymphatics: Denies new lymphadenopathy or easy bruising Neurological:Denies numbness, tingling or new weaknesses Behavioral/Psych: Mood is stable, no new changes  All other systems were reviewed with the patient and are negative.  I have reviewed the past medical history, past surgical history, social history and family history with the patient and they are unchanged from previous note.  ALLERGIES:  has No Known Allergies.  MEDICATIONS:  Current Outpatient Medications  Medication Sig Dispense Refill  . ALPRAZolam (XANAX) 0.5 MG tablet Take 0.25 mg by mouth daily as needed for anxiety.    Marland Kitchen anagrelide (AGRYLIN) 1 MG capsule TAKE 1 CAPSULE(1 MG) BY MOUTH DAILY 30 capsule 3  . aspirin 81 MG tablet Take 1 tablet (81 mg total) by mouth daily. 30 tablet 0  . diclofenac sodium (VOLTAREN) 1 % GEL Apply 2 g topically 4 (four) times daily. 1 Tube 0  . diphenhydramine-acetaminophen (TYLENOL  PM) 25-500 MG TABS tablet Take 1 tablet by mouth at bedtime as needed.    .  hydroxyurea (HYDREA) 500 MG capsule Take 1 on Mondays, Wednesdays and Fridays only, by mouth 12 capsule 9  . losartan-hydrochlorothiazide (HYZAAR) 100-25 MG tablet Take 1 tablet by mouth daily.    . metoprolol (LOPRESSOR) 50 MG tablet Take 50 mg by mouth 2 (two) times daily.    . Multiple Vitamin (MULTIVITAMIN) tablet Take 1 tablet by mouth daily.    . vitamin B-12 (CYANOCOBALAMIN) 1000 MCG tablet Take 1 tablet (1,000 mcg total) by mouth daily. 30 tablet 6   Current Facility-Administered Medications  Medication Dose Route Frequency Provider Last Rate Last Admin  . influenza vac split quadrivalent PF (FLUARIX) injection 0.5 mL  0.5 mL Intramuscular Once Alvy Bimler, Lazariah Savard, MD        PHYSICAL EXAMINATION: ECOG PERFORMANCE STATUS: 0 - Asymptomatic  Vitals:   07/08/19 1132  BP: 116/77  Pulse: 79  Resp: 18  Temp: 97.8 F (36.6 C)  SpO2: 100%   Filed Weights   07/08/19 1132  Weight: 196 lb 4.8 oz (89 kg)    GENERAL:alert, no distress and comfortable NEURO: alert & oriented x 3 with fluent speech, no focal motor/sensory deficits  LABORATORY DATA:  I have reviewed the data as listed    Component Value Date/Time   NA 139 11/23/2018 1148   NA 141 11/02/2014 1308   K 3.8 11/23/2018 1148   K 3.8 11/02/2014 1308   CL 104 11/23/2018 1148   CL 106 10/06/2012 1002   CO2 24 11/23/2018 1148   CO2 24 11/02/2014 1308   GLUCOSE 118 (H) 11/23/2018 1148   GLUCOSE 99 11/02/2014 1308   GLUCOSE 104 (H) 10/06/2012 1002   BUN 15 11/23/2018 1148   BUN 9.8 11/02/2014 1308   CREATININE 1.12 (H) 11/23/2018 1148   CREATININE 0.9 11/02/2014 1308   CALCIUM 9.6 11/23/2018 1148   CALCIUM 9.2 11/02/2014 1308   PROT 7.7 11/23/2018 1148   PROT 7.1 11/02/2014 1308   ALBUMIN 4.3 11/23/2018 1148   ALBUMIN 4.0 11/02/2014 1308   AST 16 11/23/2018 1148   AST 23 11/02/2014 1308   ALT 14 11/23/2018 1148   ALT 22 11/02/2014 1308   ALKPHOS 81 11/23/2018 1148   ALKPHOS 118 11/02/2014 1308   BILITOT 0.5  11/23/2018 1148   BILITOT 0.34 11/02/2014 1308   GFRNONAA 56 (L) 11/23/2018 1148   GFRAA >60 11/23/2018 1148    No results found for: SPEP, UPEP  Lab Results  Component Value Date   WBC 6.0 07/08/2019   NEUTROABS 2.9 07/08/2019   HGB 11.5 (L) 07/08/2019   HCT 36.0 07/08/2019   MCV 96.0 07/08/2019   PLT 446 (H) 07/08/2019      Chemistry      Component Value Date/Time   NA 139 11/23/2018 1148   NA 141 11/02/2014 1308   K 3.8 11/23/2018 1148   K 3.8 11/02/2014 1308   CL 104 11/23/2018 1148   CL 106 10/06/2012 1002   CO2 24 11/23/2018 1148   CO2 24 11/02/2014 1308   BUN 15 11/23/2018 1148   BUN 9.8 11/02/2014 1308   CREATININE 1.12 (H) 11/23/2018 1148   CREATININE 0.9 11/02/2014 1308      Component Value Date/Time   CALCIUM 9.6 11/23/2018 1148   CALCIUM 9.2 11/02/2014 1308   ALKPHOS 81 11/23/2018 1148   ALKPHOS 118 11/02/2014 1308   AST 16 11/23/2018 1148   AST 23 11/02/2014  1308   ALT 14 11/23/2018 1148   ALT 22 11/02/2014 1308   BILITOT 0.5 11/23/2018 1148   BILITOT 0.34 11/02/2014 1308      All questions were answered. The patient knows to call the clinic with any problems, questions or concerns. No barriers to learning was detected.  I spent 8 minutes counseling the patient face to face. The total time spent in the appointment was 10 minutes and more than 50% was on counseling and review of test results  [name], MD 07/08/2019 11:34 AM

## 2019-07-08 NOTE — Assessment & Plan Note (Addendum)
With recent addition of hydroxyurea, her platelet count is better controlled She has no signs of worsening anemia She stated she has been compliant taking medications as directed I recommend she continues taking hydroxyurea 3 times a week along with anagrelide daily She will continue aspirin therapy I will see her again in 4 months for further follow-up Previously, we discussed influenza vaccination but she did not receive it She is in agreement to receive influenza vaccination today

## 2019-07-09 ENCOUNTER — Telehealth: Payer: Self-pay | Admitting: Hematology and Oncology

## 2019-07-09 NOTE — Telephone Encounter (Signed)
Scheduled appt per 12/11 sch message - mailed reminder letter with appt date and time

## 2019-08-04 DIAGNOSIS — Z6828 Body mass index (BMI) 28.0-28.9, adult: Secondary | ICD-10-CM | POA: Diagnosis not present

## 2019-08-04 DIAGNOSIS — Z20822 Contact with and (suspected) exposure to covid-19: Secondary | ICD-10-CM | POA: Diagnosis not present

## 2019-10-13 ENCOUNTER — Other Ambulatory Visit: Payer: Self-pay | Admitting: Hematology and Oncology

## 2019-10-14 DIAGNOSIS — M546 Pain in thoracic spine: Secondary | ICD-10-CM | POA: Diagnosis not present

## 2019-10-26 ENCOUNTER — Telehealth: Payer: Self-pay | Admitting: Hematology and Oncology

## 2019-10-26 NOTE — Telephone Encounter (Signed)
Called pt per 3/31 sch message - no answer  - left message for patient to call back to reschedule.

## 2019-11-08 ENCOUNTER — Other Ambulatory Visit: Payer: Federal, State, Local not specified - PPO

## 2019-11-08 ENCOUNTER — Inpatient Hospital Stay: Payer: Federal, State, Local not specified - PPO | Attending: Hematology and Oncology

## 2019-11-08 ENCOUNTER — Inpatient Hospital Stay (HOSPITAL_BASED_OUTPATIENT_CLINIC_OR_DEPARTMENT_OTHER): Payer: Federal, State, Local not specified - PPO | Admitting: Hematology and Oncology

## 2019-11-08 ENCOUNTER — Other Ambulatory Visit: Payer: Self-pay

## 2019-11-08 ENCOUNTER — Encounter: Payer: Self-pay | Admitting: Hematology and Oncology

## 2019-11-08 ENCOUNTER — Ambulatory Visit: Payer: Federal, State, Local not specified - PPO | Admitting: Hematology and Oncology

## 2019-11-08 DIAGNOSIS — N182 Chronic kidney disease, stage 2 (mild): Secondary | ICD-10-CM | POA: Insufficient documentation

## 2019-11-08 DIAGNOSIS — D473 Essential (hemorrhagic) thrombocythemia: Secondary | ICD-10-CM | POA: Diagnosis not present

## 2019-11-08 DIAGNOSIS — Z7982 Long term (current) use of aspirin: Secondary | ICD-10-CM | POA: Diagnosis not present

## 2019-11-08 DIAGNOSIS — Z791 Long term (current) use of non-steroidal anti-inflammatories (NSAID): Secondary | ICD-10-CM | POA: Insufficient documentation

## 2019-11-08 DIAGNOSIS — F329 Major depressive disorder, single episode, unspecified: Secondary | ICD-10-CM | POA: Diagnosis not present

## 2019-11-08 DIAGNOSIS — D649 Anemia, unspecified: Secondary | ICD-10-CM | POA: Insufficient documentation

## 2019-11-08 DIAGNOSIS — Z79899 Other long term (current) drug therapy: Secondary | ICD-10-CM | POA: Diagnosis not present

## 2019-11-08 DIAGNOSIS — D630 Anemia in neoplastic disease: Secondary | ICD-10-CM

## 2019-11-08 LAB — CBC WITH DIFFERENTIAL/PLATELET
Abs Immature Granulocytes: 0.12 10*3/uL — ABNORMAL HIGH (ref 0.00–0.07)
Basophils Absolute: 0.1 10*3/uL (ref 0.0–0.1)
Basophils Relative: 1 %
Eosinophils Absolute: 0 10*3/uL (ref 0.0–0.5)
Eosinophils Relative: 1 %
HCT: 35.4 % — ABNORMAL LOW (ref 36.0–46.0)
Hemoglobin: 11 g/dL — ABNORMAL LOW (ref 12.0–15.0)
Immature Granulocytes: 2 %
Lymphocytes Relative: 33 %
Lymphs Abs: 2 10*3/uL (ref 0.7–4.0)
MCH: 30.4 pg (ref 26.0–34.0)
MCHC: 31.1 g/dL (ref 30.0–36.0)
MCV: 97.8 fL (ref 80.0–100.0)
Monocytes Absolute: 0.5 10*3/uL (ref 0.1–1.0)
Monocytes Relative: 8 %
Neutro Abs: 3.4 10*3/uL (ref 1.7–7.7)
Neutrophils Relative %: 55 %
Platelets: 431 10*3/uL — ABNORMAL HIGH (ref 150–400)
RBC: 3.62 MIL/uL — ABNORMAL LOW (ref 3.87–5.11)
RDW: 18.7 % — ABNORMAL HIGH (ref 11.5–15.5)
WBC: 6.1 10*3/uL (ref 4.0–10.5)
nRBC: 0.7 % — ABNORMAL HIGH (ref 0.0–0.2)

## 2019-11-08 NOTE — Assessment & Plan Note (Signed)
With recent addition of hydroxyurea, her platelet count is better controlled She has no signs of worsening anemia She stated she has been compliant taking medications as directed I recommend she continues taking hydroxyurea 3 times a week along with anagrelide daily She will continue aspirin therapy I will see her again in 4 months for further follow-up

## 2019-11-08 NOTE — Progress Notes (Signed)
[place] OFFICE PROGRESS NOTE  Patient Care Team: Wendy Small, MD as PCP - General (Family Medicine)  ASSESSMENT & PLAN:  Essential thrombocythemia With recent addition of hydroxyurea, her platelet count is better controlled She has no signs of worsening anemia She stated she has been compliant taking medications as directed I recommend she continues taking hydroxyurea 3 times a week along with anagrelide daily She will continue aspirin therapy I will see her again in 4 months for further follow-up  Anemia in neoplastic disease She has multifactorial anemia. She has mild chronic kidney failure that is stable We will observe only for now   No orders of the defined types were placed in this encounter.   All questions were answered. The patient knows to call the clinic with any problems, questions or concerns. The total time spent in the appointment was 15 minutes encounter with patients including review of chart and various tests results, discussions about plan of care and coordination of care plan   Wendy Lark, MD 11/08/2019 9:41 AM  INTERVAL HISTORY: Please see below for problem oriented charting. She returns for further follow-up for essential thrombocytosis She is compliant taking medications as directed She did not have problems getting her medications refilled No recent infection, fever or chills She tolerated Covid vaccination without difficulties The patient denies any recent signs or symptoms of bleeding such as spontaneous epistaxis, hematuria or hematochezia.  SUMMARY OF ONCOLOGIC HISTORY:  I reviewed the patient's records extensive and collaborated the history with the patient. Summary of her history is as follows: This patient was originally diagnosed with myeloproliferative disorder approximately 10 years ago. According to her, she have chronic back pain. MRI showed abnormal bone marrow signal and she was subsequently referred to see an  oncologist. Bone marrow biopsy dated 12/10/2004, confirm essential thrombocytosis. The patient was started on Hydroxyurea 1000 mg daily for 5 days and 1500 mg on Tuesdays and Fridays and anagrelide 0.5 mg twice daily. She has poor compliance which she attributed to major depression. She self discontinued medication for over 6 months in 2016 to remain on aspirin to prevent risk of blood clots. She has frequent sweats which she attributed to menopausal symptoms. The patient denies any recent signs or symptoms of bleeding such as spontaneous epistaxis, hematuria or hematochezia. On 02/01/2015, her treatment is switched to 0.5 mg of anagrelide daily along with aspirin In December 2017, anagrelide is increased to 1 mg daily along with aspirin From 2017-2019, she had intermittent treatment due to loss of follow-up and noncompliance And of April, anagrelide is stopped due to [employer] and she is prescribed hydroxyurea instead 03/03/2019: Her treatment is modified by introducing hydroxyurea 3 times a week along with anagrelide 1 mg daily  REVIEW OF SYSTEMS:   Constitutional: Denies fevers, chills or abnormal weight loss Eyes: Denies blurriness of vision Ears, nose, mouth, throat, and face: Denies mucositis or sore throat Respiratory: Denies cough, dyspnea or wheezes Cardiovascular: Denies palpitation, chest discomfort or lower extremity swelling Gastrointestinal:  Denies nausea, heartburn or change in bowel habits Skin: Denies abnormal skin rashes Lymphatics: Denies new lymphadenopathy or easy bruising Neurological:Denies numbness, tingling or new weaknesses Behavioral/Psych: Mood is stable, no new changes  All other systems were reviewed with the patient and are negative.  I have reviewed the past medical history, past surgical history, social history and family history with the patient and they are unchanged from previous note.  ALLERGIES:  has No Known Allergies.  MEDICATIONS:  Current  Outpatient Medications  Medication Sig Dispense Refill  . ALPRAZolam (XANAX) 0.5 MG tablet Take 0.25 mg by mouth daily as needed for anxiety.    Marland Kitchen anagrelide (AGRYLIN) 1 MG capsule TAKE 1 CAPSULE(1 MG) BY MOUTH DAILY 30 capsule 3  . aspirin 81 MG tablet Take 1 tablet (81 mg total) by mouth daily. 30 tablet 0  . diclofenac sodium (VOLTAREN) 1 % GEL Apply 2 g topically 4 (four) times daily. 1 Tube 0  . diphenhydramine-acetaminophen (TYLENOL PM) 25-500 MG TABS tablet Take 1 tablet by mouth at bedtime as needed.    . hydroxyurea (HYDREA) 500 MG capsule Take 1 on Mondays, Wednesdays and Fridays only, by mouth 12 capsule 9  . losartan-hydrochlorothiazide (HYZAAR) 100-25 MG tablet Take 1 tablet by mouth daily.    . metoprolol (LOPRESSOR) 50 MG tablet Take 50 mg by mouth 2 (two) times daily.    . Multiple Vitamin (MULTIVITAMIN) tablet Take 1 tablet by mouth daily.    . vitamin B-12 (CYANOCOBALAMIN) 1000 MCG tablet Take 1 tablet (1,000 mcg total) by mouth daily. 30 tablet 6   No current facility-administered medications for this visit.    PHYSICAL EXAMINATION: ECOG PERFORMANCE STATUS: 0 - Asymptomatic  Vitals:   11/08/19 0907  BP: 115/67  Pulse: (!) 55  Resp: 18  Temp: 98.2 F (36.8 C)  SpO2: 100%   Filed Weights   11/08/19 0907  Weight: 198 lb 6.4 oz (90 kg)    GENERAL:alert, no distress and comfortable  LABORATORY DATA:  I have reviewed the data as listed    Component Value Date/Time   NA 139 11/23/2018 1148   NA 141 11/02/2014 1308   K 3.8 11/23/2018 1148   K 3.8 11/02/2014 1308   CL 104 11/23/2018 1148   CL 106 10/06/2012 1002   CO2 24 11/23/2018 1148   CO2 24 11/02/2014 1308   GLUCOSE 118 (H) 11/23/2018 1148   GLUCOSE 99 11/02/2014 1308   GLUCOSE 104 (H) 10/06/2012 1002   BUN 15 11/23/2018 1148   BUN 9.8 11/02/2014 1308   CREATININE 1.12 (H) 11/23/2018 1148   CREATININE 0.9 11/02/2014 1308   CALCIUM 9.6 11/23/2018 1148   CALCIUM 9.2 11/02/2014 1308   PROT 7.7  11/23/2018 1148   PROT 7.1 11/02/2014 1308   ALBUMIN 4.3 11/23/2018 1148   ALBUMIN 4.0 11/02/2014 1308   AST 16 11/23/2018 1148   AST 23 11/02/2014 1308   ALT 14 11/23/2018 1148   ALT 22 11/02/2014 1308   ALKPHOS 81 11/23/2018 1148   ALKPHOS 118 11/02/2014 1308   BILITOT 0.5 11/23/2018 1148   BILITOT 0.34 11/02/2014 1308   GFRNONAA 56 (L) 11/23/2018 1148   GFRAA >60 11/23/2018 1148    No results found for: SPEP, UPEP  Lab Results  Component Value Date   WBC 6.1 11/08/2019   NEUTROABS 3.4 11/08/2019   HGB 11.0 (L) 11/08/2019   HCT 35.4 (L) 11/08/2019   MCV 97.8 11/08/2019   PLT 431 (H) 11/08/2019      Chemistry      Component Value Date/Time   NA 139 11/23/2018 1148   NA 141 11/02/2014 1308   K 3.8 11/23/2018 1148   K 3.8 11/02/2014 1308   CL 104 11/23/2018 1148   CL 106 10/06/2012 1002   CO2 24 11/23/2018 1148   CO2 24 11/02/2014 1308   BUN 15 11/23/2018 1148   BUN 9.8 11/02/2014 1308   CREATININE 1.12 (H) 11/23/2018 1148   CREATININE 0.9 11/02/2014 1308  Component Value Date/Time   CALCIUM 9.6 11/23/2018 1148   CALCIUM 9.2 11/02/2014 1308   ALKPHOS 81 11/23/2018 1148   ALKPHOS 118 11/02/2014 1308   AST 16 11/23/2018 1148   AST 23 11/02/2014 1308   ALT 14 11/23/2018 1148   ALT 22 11/02/2014 1308   BILITOT 0.5 11/23/2018 1148   BILITOT 0.34 11/02/2014 1308

## 2019-11-08 NOTE — Assessment & Plan Note (Signed)
She has multifactorial anemia. She has mild chronic kidney failure that is stable We will observe only for now

## 2019-11-09 ENCOUNTER — Telehealth: Payer: Self-pay | Admitting: Hematology and Oncology

## 2019-11-09 NOTE — Telephone Encounter (Signed)
Scheduled appts per 4/13 sch msg. Left voicemail with new appt date and time. Sent msg to HIM to mail reminder letter/calendar.

## 2020-01-12 ENCOUNTER — Other Ambulatory Visit: Payer: Self-pay | Admitting: Hematology and Oncology

## 2020-01-12 MED ORDER — HYDROXYUREA 500 MG PO CAPS: ORAL_CAPSULE | ORAL | 9 refills | Status: DC

## 2020-01-20 DIAGNOSIS — M25562 Pain in left knee: Secondary | ICD-10-CM | POA: Diagnosis not present

## 2020-01-20 DIAGNOSIS — M25561 Pain in right knee: Secondary | ICD-10-CM | POA: Diagnosis not present

## 2020-01-20 DIAGNOSIS — M1712 Unilateral primary osteoarthritis, left knee: Secondary | ICD-10-CM | POA: Diagnosis not present

## 2020-03-12 ENCOUNTER — Telehealth: Payer: Self-pay | Admitting: Hematology and Oncology

## 2020-03-12 ENCOUNTER — Inpatient Hospital Stay: Payer: Federal, State, Local not specified - PPO

## 2020-03-12 ENCOUNTER — Inpatient Hospital Stay: Payer: Federal, State, Local not specified - PPO | Admitting: Hematology and Oncology

## 2020-03-12 NOTE — Telephone Encounter (Signed)
Called pt per 8/16 sch message - no answer. Left message for patient to call back to reschedule apt.

## 2020-03-29 ENCOUNTER — Inpatient Hospital Stay: Payer: Federal, State, Local not specified - PPO | Admitting: Hematology and Oncology

## 2020-03-29 ENCOUNTER — Encounter: Payer: Self-pay | Admitting: Hematology and Oncology

## 2020-03-29 ENCOUNTER — Inpatient Hospital Stay: Payer: Federal, State, Local not specified - PPO | Attending: Hematology and Oncology

## 2020-05-01 DIAGNOSIS — I10 Essential (primary) hypertension: Secondary | ICD-10-CM | POA: Diagnosis not present

## 2020-05-01 DIAGNOSIS — Z23 Encounter for immunization: Secondary | ICD-10-CM | POA: Diagnosis not present

## 2020-05-21 ENCOUNTER — Other Ambulatory Visit: Payer: Self-pay | Admitting: Hematology and Oncology

## 2020-05-24 ENCOUNTER — Encounter: Payer: Self-pay | Admitting: Hematology and Oncology

## 2020-05-31 ENCOUNTER — Other Ambulatory Visit: Payer: Self-pay

## 2020-05-31 ENCOUNTER — Inpatient Hospital Stay: Payer: Federal, State, Local not specified - PPO | Attending: Hematology and Oncology

## 2020-05-31 ENCOUNTER — Encounter: Payer: Self-pay | Admitting: Hematology and Oncology

## 2020-05-31 ENCOUNTER — Inpatient Hospital Stay (HOSPITAL_BASED_OUTPATIENT_CLINIC_OR_DEPARTMENT_OTHER): Payer: Federal, State, Local not specified - PPO | Admitting: Hematology and Oncology

## 2020-05-31 DIAGNOSIS — Z79899 Other long term (current) drug therapy: Secondary | ICD-10-CM | POA: Diagnosis not present

## 2020-05-31 DIAGNOSIS — D473 Essential (hemorrhagic) thrombocythemia: Secondary | ICD-10-CM | POA: Diagnosis not present

## 2020-05-31 DIAGNOSIS — D630 Anemia in neoplastic disease: Secondary | ICD-10-CM | POA: Diagnosis not present

## 2020-05-31 DIAGNOSIS — N182 Chronic kidney disease, stage 2 (mild): Secondary | ICD-10-CM | POA: Insufficient documentation

## 2020-05-31 DIAGNOSIS — D75839 Thrombocytosis, unspecified: Secondary | ICD-10-CM | POA: Insufficient documentation

## 2020-05-31 DIAGNOSIS — Z7982 Long term (current) use of aspirin: Secondary | ICD-10-CM | POA: Diagnosis not present

## 2020-05-31 DIAGNOSIS — F329 Major depressive disorder, single episode, unspecified: Secondary | ICD-10-CM | POA: Insufficient documentation

## 2020-05-31 DIAGNOSIS — I10 Essential (primary) hypertension: Secondary | ICD-10-CM | POA: Diagnosis not present

## 2020-05-31 DIAGNOSIS — F419 Anxiety disorder, unspecified: Secondary | ICD-10-CM | POA: Diagnosis not present

## 2020-05-31 DIAGNOSIS — D649 Anemia, unspecified: Secondary | ICD-10-CM | POA: Diagnosis not present

## 2020-05-31 LAB — CBC WITH DIFFERENTIAL/PLATELET
Abs Immature Granulocytes: 0.09 10*3/uL — ABNORMAL HIGH (ref 0.00–0.07)
Basophils Absolute: 0.1 10*3/uL (ref 0.0–0.1)
Basophils Relative: 1 %
Eosinophils Absolute: 0 10*3/uL (ref 0.0–0.5)
Eosinophils Relative: 1 %
HCT: 31.7 % — ABNORMAL LOW (ref 36.0–46.0)
Hemoglobin: 10 g/dL — ABNORMAL LOW (ref 12.0–15.0)
Immature Granulocytes: 2 %
Lymphocytes Relative: 35 %
Lymphs Abs: 1.8 10*3/uL (ref 0.7–4.0)
MCH: 30.5 pg (ref 26.0–34.0)
MCHC: 31.5 g/dL (ref 30.0–36.0)
MCV: 96.6 fL (ref 80.0–100.0)
Monocytes Absolute: 0.5 10*3/uL (ref 0.1–1.0)
Monocytes Relative: 10 %
Neutro Abs: 2.7 10*3/uL (ref 1.7–7.7)
Neutrophils Relative %: 51 %
Platelets: 353 10*3/uL (ref 150–400)
RBC: 3.28 MIL/uL — ABNORMAL LOW (ref 3.87–5.11)
RDW: 18.6 % — ABNORMAL HIGH (ref 11.5–15.5)
WBC: 5.1 10*3/uL (ref 4.0–10.5)
nRBC: 1.6 % — ABNORMAL HIGH (ref 0.0–0.2)

## 2020-05-31 MED ORDER — HYDROXYUREA 500 MG PO CAPS: ORAL_CAPSULE | ORAL | 1 refills | Status: DC

## 2020-05-31 MED ORDER — ANAGRELIDE HCL 1 MG PO CAPS: ORAL_CAPSULE | ORAL | 1 refills | Status: DC

## 2020-05-31 NOTE — Assessment & Plan Note (Signed)
With recent addition of hydroxyurea, her platelet count is better controlled She has no signs of worsening anemia She stated she has been compliant taking medications as directed I recommend she continues taking hydroxyurea 3 times a week along with anagrelide daily She will continue aspirin therapy I will see her again in 3 months for further follow-up

## 2020-05-31 NOTE — Assessment & Plan Note (Signed)
Her blood pressure is profoundly elevated  She attributed that to anxiety She stated she is compliant taking her blood pressure medication as directed by her primary care doctor

## 2020-05-31 NOTE — Progress Notes (Signed)
[place] OFFICE PROGRESS NOTE  Patient Care Team: [name], MD as PCP - General (Family Medicine)  ASSESSMENT & PLAN:  Essential thrombocythemia With recent addition of hydroxyurea, her platelet count is better controlled She has no signs of worsening anemia She stated she has been compliant taking medications as directed I recommend she continues taking hydroxyurea 3 times a week along with anagrelide daily She will continue aspirin therapy I will see her again in 3 months for further follow-up  Anemia in neoplastic disease She has multifactorial anemia. She has mild chronic kidney failure that is stable We will observe only for now  Essential hypertension Her blood pressure is profoundly elevated  She attributed that to anxiety She stated she is compliant taking her blood pressure medication as directed by her primary care doctor   No orders of the defined types were placed in this encounter.   All questions were answered. The patient knows to call the clinic with any problems, questions or concerns. The total time spent in the appointment was 20 minutes encounter with patients including review of chart and various tests results, discussions about plan of care and coordination of care plan   [name], MD 05/31/2020 12:49 PM  INTERVAL HISTORY: Please see below for problem oriented charting. She returns for further follow-up She missed her appointment recently No recent infection, fever or chills She stated she is compliant taking her medication as prescribed She is taking aspirin daily  SUMMARY OF ONCOLOGIC HISTORY:  I reviewed the patient's records extensive and collaborated the history with the patient. Summary of her history is as follows: This patient was originally diagnosed with myeloproliferative disorder approximately 10 years ago. According to her, she have chronic back pain. MRI showed abnormal bone marrow signal and she was subsequently  referred to see an oncologist. Bone marrow biopsy dated 12/10/2004, confirm essential thrombocytosis. The patient was started on Hydroxyurea 1000 mg daily for 5 days and 1500 mg on Tuesdays and Fridays and anagrelide 0.5 mg twice daily. She has poor compliance which she attributed to major depression. She self discontinued medication for over 6 months in 2016 to remain on aspirin to prevent risk of blood clots. She has frequent sweats which she attributed to menopausal symptoms. The patient denies any recent signs or symptoms of bleeding such as spontaneous epistaxis, hematuria or hematochezia. On 02/01/2015, her treatment is switched to 0.5 mg of anagrelide daily along with aspirin In December 2017, anagrelide is increased to 1 mg daily along with aspirin From 2017-2019, she had intermittent treatment due to loss of follow-up and noncompliance And of April, anagrelide is stopped due to [employer] and she is prescribed hydroxyurea instead 03/03/2019: Her treatment is modified by introducing hydroxyurea 3 times a week along with anagrelide 1 mg daily   REVIEW OF SYSTEMS:   Constitutional: Denies fevers, chills or abnormal weight loss Eyes: Denies blurriness of vision Ears, nose, mouth, throat, and face: Denies mucositis or sore throat Respiratory: Denies cough, dyspnea or wheezes Cardiovascular: Denies palpitation, chest discomfort or lower extremity swelling Gastrointestinal:  Denies nausea, heartburn or change in bowel habits Skin: Denies abnormal skin rashes Lymphatics: Denies new lymphadenopathy or easy bruising Neurological:Denies numbness, tingling or new weaknesses Behavioral/Psych: Mood is stable, no new changes  All other systems were reviewed with the patient and are negative.  I have reviewed the past medical history, past surgical history, social history and family history with the patient and they are unchanged from previous note.  ALLERGIES:  has No Known  Allergies.  MEDICATIONS:  Current Outpatient Medications  Medication Sig Dispense Refill  . ALPRAZolam (XANAX) 0.5 MG tablet Take 0.25 mg by mouth daily as needed for anxiety.    Marland Kitchen anagrelide (AGRYLIN) 1 MG capsule TAKE 1 CAPSULE(1 MG) BY MOUTH DAILY 90 capsule 1  . aspirin 81 MG tablet Take 1 tablet (81 mg total) by mouth daily. 30 tablet 0  . diclofenac sodium (VOLTAREN) 1 % GEL Apply 2 g topically 4 (four) times daily. 1 Tube 0  . diphenhydramine-acetaminophen (TYLENOL PM) 25-500 MG TABS tablet Take 1 tablet by mouth at bedtime as needed.    . hydroxyurea (HYDREA) 500 MG capsule Take 1 on Mondays, Wednesdays and Fridays only, by mouth 36 capsule 1  . losartan-hydrochlorothiazide (HYZAAR) 100-25 MG tablet Take 1 tablet by mouth daily.    . metoprolol (LOPRESSOR) 50 MG tablet Take 50 mg by mouth 2 (two) times daily.    . Multiple Vitamin (MULTIVITAMIN) tablet Take 1 tablet by mouth daily.    . vitamin B-12 (CYANOCOBALAMIN) 1000 MCG tablet Take 1 tablet (1,000 mcg total) by mouth daily. 30 tablet 6   No current facility-administered medications for this visit.    PHYSICAL EXAMINATION: ECOG PERFORMANCE STATUS: 0 - Asymptomatic  Vitals:   05/31/20 1141  BP: (!) 166/100  Pulse: (!) 58  Resp: 18  Temp: 97.6 F (36.4 C)  SpO2: 100%   Filed Weights   05/31/20 1141  Weight: 203 lb 6.4 oz (92.3 kg)    GENERAL:alert, no distress and comfortable Musculoskeletal:no cyanosis of digits and no clubbing  NEURO: alert & oriented x 3 with fluent speech, no focal motor/sensory deficits  LABORATORY DATA:  I have reviewed the data as listed    Component Value Date/Time   NA 139 11/23/2018 1148   NA 141 11/02/2014 1308   K 3.8 11/23/2018 1148   K 3.8 11/02/2014 1308   CL 104 11/23/2018 1148   CL 106 10/06/2012 1002   CO2 24 11/23/2018 1148   CO2 24 11/02/2014 1308   GLUCOSE 118 (H) 11/23/2018 1148   GLUCOSE 99 11/02/2014 1308   GLUCOSE 104 (H) 10/06/2012 1002   BUN 15 11/23/2018  1148   BUN 9.8 11/02/2014 1308   CREATININE 1.12 (H) 11/23/2018 1148   CREATININE 0.9 11/02/2014 1308   CALCIUM 9.6 11/23/2018 1148   CALCIUM 9.2 11/02/2014 1308   PROT 7.7 11/23/2018 1148   PROT 7.1 11/02/2014 1308   ALBUMIN 4.3 11/23/2018 1148   ALBUMIN 4.0 11/02/2014 1308   AST 16 11/23/2018 1148   AST 23 11/02/2014 1308   ALT 14 11/23/2018 1148   ALT 22 11/02/2014 1308   ALKPHOS 81 11/23/2018 1148   ALKPHOS 118 11/02/2014 1308   BILITOT 0.5 11/23/2018 1148   BILITOT 0.34 11/02/2014 1308   GFRNONAA 56 (L) 11/23/2018 1148   GFRAA >60 11/23/2018 1148    No results found for: SPEP, UPEP  Lab Results  Component Value Date   WBC 5.1 05/31/2020   NEUTROABS 2.7 05/31/2020   HGB 10.0 (L) 05/31/2020   HCT 31.7 (L) 05/31/2020   MCV 96.6 05/31/2020   PLT 353 05/31/2020      Chemistry      Component Value Date/Time   NA 139 11/23/2018 1148   NA 141 11/02/2014 1308   K 3.8 11/23/2018 1148   K 3.8 11/02/2014 1308   CL 104 11/23/2018 1148   CL 106 10/06/2012 1002   CO2 24 11/23/2018  1148   CO2 24 11/02/2014 1308   BUN 15 11/23/2018 1148   BUN 9.8 11/02/2014 1308   CREATININE 1.12 (H) 11/23/2018 1148   CREATININE 0.9 11/02/2014 1308      Component Value Date/Time   CALCIUM 9.6 11/23/2018 1148   CALCIUM 9.2 11/02/2014 1308   ALKPHOS 81 11/23/2018 1148   ALKPHOS 118 11/02/2014 1308   AST 16 11/23/2018 1148   AST 23 11/02/2014 1308   ALT 14 11/23/2018 1148   ALT 22 11/02/2014 1308   BILITOT 0.5 11/23/2018 1148   BILITOT 0.34 11/02/2014 1308

## 2020-05-31 NOTE — Assessment & Plan Note (Signed)
She has multifactorial anemia. She has mild chronic kidney failure that is stable We will observe only for now

## 2020-06-27 ENCOUNTER — Other Ambulatory Visit: Payer: Self-pay | Admitting: Hematology and Oncology

## 2020-08-31 ENCOUNTER — Encounter: Payer: Self-pay | Admitting: Hematology and Oncology

## 2020-08-31 ENCOUNTER — Inpatient Hospital Stay
Payer: Federal, State, Local not specified - PPO | Attending: Hematology and Oncology | Admitting: Hematology and Oncology

## 2020-08-31 ENCOUNTER — Inpatient Hospital Stay: Payer: Federal, State, Local not specified - PPO

## 2020-08-31 ENCOUNTER — Telehealth: Payer: Self-pay | Admitting: Hematology and Oncology

## 2020-08-31 ENCOUNTER — Other Ambulatory Visit: Payer: Self-pay

## 2020-08-31 DIAGNOSIS — D649 Anemia, unspecified: Secondary | ICD-10-CM | POA: Diagnosis not present

## 2020-08-31 DIAGNOSIS — Z7982 Long term (current) use of aspirin: Secondary | ICD-10-CM | POA: Diagnosis not present

## 2020-08-31 DIAGNOSIS — D630 Anemia in neoplastic disease: Secondary | ICD-10-CM

## 2020-08-31 DIAGNOSIS — F329 Major depressive disorder, single episode, unspecified: Secondary | ICD-10-CM | POA: Insufficient documentation

## 2020-08-31 DIAGNOSIS — N182 Chronic kidney disease, stage 2 (mild): Secondary | ICD-10-CM | POA: Insufficient documentation

## 2020-08-31 DIAGNOSIS — Z79899 Other long term (current) drug therapy: Secondary | ICD-10-CM | POA: Diagnosis not present

## 2020-08-31 DIAGNOSIS — D473 Essential (hemorrhagic) thrombocythemia: Secondary | ICD-10-CM

## 2020-08-31 DIAGNOSIS — I10 Essential (primary) hypertension: Secondary | ICD-10-CM | POA: Diagnosis not present

## 2020-08-31 LAB — CBC WITH DIFFERENTIAL/PLATELET
Abs Immature Granulocytes: 0.06 10*3/uL (ref 0.00–0.07)
Basophils Absolute: 0.1 10*3/uL (ref 0.0–0.1)
Basophils Relative: 1 %
Eosinophils Absolute: 0 10*3/uL (ref 0.0–0.5)
Eosinophils Relative: 1 %
HCT: 36.1 % (ref 36.0–46.0)
Hemoglobin: 11.3 g/dL — ABNORMAL LOW (ref 12.0–15.0)
Immature Granulocytes: 1 %
Lymphocytes Relative: 39 %
Lymphs Abs: 2.1 10*3/uL (ref 0.7–4.0)
MCH: 30.5 pg (ref 26.0–34.0)
MCHC: 31.3 g/dL (ref 30.0–36.0)
MCV: 97.6 fL (ref 80.0–100.0)
Monocytes Absolute: 0.5 10*3/uL (ref 0.1–1.0)
Monocytes Relative: 9 %
Neutro Abs: 2.6 10*3/uL (ref 1.7–7.7)
Neutrophils Relative %: 49 %
Platelets: 480 10*3/uL — ABNORMAL HIGH (ref 150–400)
RBC: 3.7 MIL/uL — ABNORMAL LOW (ref 3.87–5.11)
RDW: 19.8 % — ABNORMAL HIGH (ref 11.5–15.5)
WBC: 5.2 10*3/uL (ref 4.0–10.5)
nRBC: 1.1 % — ABNORMAL HIGH (ref 0.0–0.2)

## 2020-08-31 MED ORDER — HYDROXYUREA 500 MG PO CAPS: ORAL_CAPSULE | ORAL | 1 refills | Status: DC

## 2020-08-31 NOTE — Assessment & Plan Note (Signed)
Her blood pressure is intermittently elevated  She stated she is compliant taking her blood pressure medication as directed by her primary care doctor

## 2020-08-31 NOTE — Telephone Encounter (Signed)
Scheduled appts per 2/2 los. Gave pt a print out of AVS

## 2020-08-31 NOTE — Assessment & Plan Note (Signed)
She has multifactorial anemia. She has mild chronic kidney failure that is stable We will observe only for now

## 2020-08-31 NOTE — Progress Notes (Signed)
[place] OFFICE PROGRESS NOTE  Patient Care Team: [name], MD as PCP - General (Family Medicine)  ASSESSMENT & PLAN:  Essential thrombocythemia With recent addition of hydroxyurea, her platelet count is better controlled She denies missing doses Her platelet count is trending up She has no signs of worsening anemia She stated she has been compliant taking medications as directed I recommend changing the dose of hydroxyurea. She will skip taking hydroxyurea on Mondays, Wednesdays and Fridays but to take one hydroxyurea daily for the rest of the week, along with anagrelide daily and aspirin  I will see her again in 3 months for further follow-up  Anemia in neoplastic disease She has multifactorial anemia. She has mild chronic kidney failure that is stable We will observe only for now  Essential hypertension Her blood pressure is intermittently elevated  She stated she is compliant taking her blood pressure medication as directed by her primary care doctor   No orders of the defined types were placed in this encounter.   All questions were answered. The patient knows to call the clinic with any problems, questions or concerns. The total time spent in the appointment was 20 minutes encounter with patients including review of chart and various tests results, discussions about plan of care and coordination of care plan   [name], MD 08/31/2020 12:22 PM  INTERVAL HISTORY: Please see below for problem oriented charting. She returns for further follow-up She is compliant taking her medications as directed She has noted that her blood pressure is intermittently elevated The patient denies any recent signs or symptoms of bleeding such as spontaneous epistaxis, hematuria or hematochezia.   SUMMARY OF ONCOLOGIC HISTORY:  I reviewed the patient's records extensive and collaborated the history with the patient. Summary of her history is as follows: This patient  was originally diagnosed with myeloproliferative disorder approximately 10 years ago. According to her, she have chronic back pain. MRI showed abnormal bone marrow signal and she was subsequently referred to see an oncologist. Bone marrow biopsy dated 12/10/2004, confirm essential thrombocytosis. The patient was started on Hydroxyurea 1000 mg daily for 5 days and 1500 mg on Tuesdays and Fridays and anagrelide 0.5 mg twice daily. She has poor compliance which she attributed to major depression. She self discontinued medication for over 6 months in 2016 to remain on aspirin to prevent risk of blood clots. She has frequent sweats which she attributed to menopausal symptoms. The patient denies any recent signs or symptoms of bleeding such as spontaneous epistaxis, hematuria or hematochezia. On 02/01/2015, her treatment is switched to 0.5 mg of anagrelide daily along with aspirin In December 2017, anagrelide is increased to 1 mg daily along with aspirin From 2017-2019, she had intermittent treatment due to loss of follow-up and noncompliance And of April, anagrelide is stopped due to [employer] and she is prescribed hydroxyurea instead 03/03/2019: Her treatment is modified by introducing hydroxyurea 3 times a week along with anagrelide 1 mg daily On 08/31/2020, her treatment is modified by changing hydroxyurea to 4 times a week along with anagrelide 1 mg daily  REVIEW OF SYSTEMS:   Constitutional: Denies fevers, chills or abnormal weight loss Eyes: Denies blurriness of vision Ears, nose, mouth, throat, and face: Denies mucositis or sore throat Respiratory: Denies cough, dyspnea or wheezes Cardiovascular: Denies palpitation, chest discomfort or lower extremity swelling Gastrointestinal:  Denies nausea, heartburn or change in bowel habits Skin: Denies abnormal skin rashes Lymphatics: Denies new lymphadenopathy or easy bruising Neurological:Denies  numbness, tingling or new  weaknesses Behavioral/Psych: Mood is stable, no new changes  All other systems were reviewed with the patient and are negative.  I have reviewed the past medical history, past surgical history, social history and family history with the patient and they are unchanged from previous note.  ALLERGIES:  has No Known Allergies.  MEDICATIONS:  Current Outpatient Medications  Medication Sig Dispense Refill  . ALPRAZolam (XANAX) 0.5 MG tablet Take 0.25 mg by mouth daily as needed for anxiety.    Marland Kitchen anagrelide (AGRYLIN) 1 MG capsule TAKE 1 CAPSULE(1 MG) BY MOUTH DAILY 90 capsule 1  . aspirin 81 MG tablet Take 1 tablet (81 mg total) by mouth daily. 30 tablet 0  . diclofenac sodium (VOLTAREN) 1 % GEL Apply 2 g topically 4 (four) times daily. 1 Tube 0  . diphenhydramine-acetaminophen (TYLENOL PM) 25-500 MG TABS tablet Take 1 tablet by mouth at bedtime as needed.    . hydroxyurea (HYDREA) 500 MG capsule Take 1 every day except on Mondays, Wednesdays and Fridays, do not take 50 capsule 1  . losartan-hydrochlorothiazide (HYZAAR) 100-25 MG tablet Take 1 tablet by mouth daily.    . metoprolol (LOPRESSOR) 50 MG tablet Take 50 mg by mouth 2 (two) times daily.    . Multiple Vitamin (MULTIVITAMIN) tablet Take 1 tablet by mouth daily.    . vitamin B-12 (CYANOCOBALAMIN) 1000 MCG tablet Take 1 tablet (1,000 mcg total) by mouth daily. 30 tablet 6   No current facility-administered medications for this visit.    PHYSICAL EXAMINATION: ECOG PERFORMANCE STATUS: 1 - Symptomatic but completely ambulatory  Vitals:   08/31/20 1128  BP: 132/77  Pulse: 66  Resp: 18  Temp: (!) 97.4 F (36.3 C)  SpO2: 100%   Filed Weights   08/31/20 1128  Weight: 195 lb 9.6 oz (88.7 kg)    GENERAL:alert, no distress and comfortable NEURO: alert & oriented x 3 with fluent speech, no focal motor/sensory deficits  LABORATORY DATA:  I have reviewed the data as listed    Component Value Date/Time   NA 139 11/23/2018 1148   NA  141 11/02/2014 1308   K 3.8 11/23/2018 1148   K 3.8 11/02/2014 1308   CL 104 11/23/2018 1148   CL 106 10/06/2012 1002   CO2 24 11/23/2018 1148   CO2 24 11/02/2014 1308   GLUCOSE 118 (H) 11/23/2018 1148   GLUCOSE 99 11/02/2014 1308   GLUCOSE 104 (H) 10/06/2012 1002   BUN 15 11/23/2018 1148   BUN 9.8 11/02/2014 1308   CREATININE 1.12 (H) 11/23/2018 1148   CREATININE 0.9 11/02/2014 1308   CALCIUM 9.6 11/23/2018 1148   CALCIUM 9.2 11/02/2014 1308   PROT 7.7 11/23/2018 1148   PROT 7.1 11/02/2014 1308   ALBUMIN 4.3 11/23/2018 1148   ALBUMIN 4.0 11/02/2014 1308   AST 16 11/23/2018 1148   AST 23 11/02/2014 1308   ALT 14 11/23/2018 1148   ALT 22 11/02/2014 1308   ALKPHOS 81 11/23/2018 1148   ALKPHOS 118 11/02/2014 1308   BILITOT 0.5 11/23/2018 1148   BILITOT 0.34 11/02/2014 1308   GFRNONAA 56 (L) 11/23/2018 1148   GFRAA >60 11/23/2018 1148    No results found for: SPEP, UPEP  Lab Results  Component Value Date   WBC 5.2 08/31/2020   NEUTROABS 2.6 08/31/2020   HGB 11.3 (L) 08/31/2020   HCT 36.1 08/31/2020   MCV 97.6 08/31/2020   PLT 480 (H) 08/31/2020      Chemistry  Component Value Date/Time   NA 139 11/23/2018 1148   NA 141 11/02/2014 1308   K 3.8 11/23/2018 1148   K 3.8 11/02/2014 1308   CL 104 11/23/2018 1148   CL 106 10/06/2012 1002   CO2 24 11/23/2018 1148   CO2 24 11/02/2014 1308   BUN 15 11/23/2018 1148   BUN 9.8 11/02/2014 1308   CREATININE 1.12 (H) 11/23/2018 1148   CREATININE 0.9 11/02/2014 1308      Component Value Date/Time   CALCIUM 9.6 11/23/2018 1148   CALCIUM 9.2 11/02/2014 1308   ALKPHOS 81 11/23/2018 1148   ALKPHOS 118 11/02/2014 1308   AST 16 11/23/2018 1148   AST 23 11/02/2014 1308   ALT 14 11/23/2018 1148   ALT 22 11/02/2014 1308   BILITOT 0.5 11/23/2018 1148   BILITOT 0.34 11/02/2014 1308

## 2020-08-31 NOTE — Assessment & Plan Note (Signed)
With recent addition of hydroxyurea, her platelet count is better controlled She denies missing doses Her platelet count is trending up She has no signs of worsening anemia She stated she has been compliant taking medications as directed I recommend changing the dose of hydroxyurea. She will skip taking hydroxyurea on Mondays, Wednesdays and Fridays but to take one hydroxyurea daily for the rest of the week, along with anagrelide daily and aspirin  I will see her again in 3 months for further follow-up

## 2020-10-05 DIAGNOSIS — Z6828 Body mass index (BMI) 28.0-28.9, adult: Secondary | ICD-10-CM | POA: Diagnosis not present

## 2020-10-05 DIAGNOSIS — Z01419 Encounter for gynecological examination (general) (routine) without abnormal findings: Secondary | ICD-10-CM | POA: Diagnosis not present

## 2020-10-05 DIAGNOSIS — Z124 Encounter for screening for malignant neoplasm of cervix: Secondary | ICD-10-CM | POA: Diagnosis not present

## 2020-10-05 DIAGNOSIS — Z113 Encounter for screening for infections with a predominantly sexual mode of transmission: Secondary | ICD-10-CM | POA: Diagnosis not present

## 2020-10-05 DIAGNOSIS — Z01411 Encounter for gynecological examination (general) (routine) with abnormal findings: Secondary | ICD-10-CM | POA: Diagnosis not present

## 2020-10-05 DIAGNOSIS — Z1231 Encounter for screening mammogram for malignant neoplasm of breast: Secondary | ICD-10-CM | POA: Diagnosis not present

## 2020-11-28 ENCOUNTER — Encounter: Payer: Self-pay | Admitting: Hematology and Oncology

## 2020-11-28 ENCOUNTER — Inpatient Hospital Stay
Payer: Federal, State, Local not specified - PPO | Attending: Hematology and Oncology | Admitting: Hematology and Oncology

## 2020-11-28 ENCOUNTER — Inpatient Hospital Stay: Payer: Federal, State, Local not specified - PPO

## 2020-11-28 ENCOUNTER — Other Ambulatory Visit: Payer: Self-pay

## 2020-11-28 DIAGNOSIS — E538 Deficiency of other specified B group vitamins: Secondary | ICD-10-CM | POA: Diagnosis not present

## 2020-11-28 DIAGNOSIS — N182 Chronic kidney disease, stage 2 (mild): Secondary | ICD-10-CM | POA: Diagnosis not present

## 2020-11-28 DIAGNOSIS — D630 Anemia in neoplastic disease: Secondary | ICD-10-CM | POA: Diagnosis not present

## 2020-11-28 DIAGNOSIS — Z7982 Long term (current) use of aspirin: Secondary | ICD-10-CM | POA: Insufficient documentation

## 2020-11-28 DIAGNOSIS — D473 Essential (hemorrhagic) thrombocythemia: Secondary | ICD-10-CM | POA: Diagnosis not present

## 2020-11-28 DIAGNOSIS — Z79899 Other long term (current) drug therapy: Secondary | ICD-10-CM | POA: Diagnosis not present

## 2020-11-28 DIAGNOSIS — D649 Anemia, unspecified: Secondary | ICD-10-CM | POA: Insufficient documentation

## 2020-11-28 LAB — CBC WITH DIFFERENTIAL/PLATELET
Abs Immature Granulocytes: 0.04 10*3/uL (ref 0.00–0.07)
Basophils Absolute: 0.1 10*3/uL (ref 0.0–0.1)
Basophils Relative: 1 %
Eosinophils Absolute: 0 10*3/uL (ref 0.0–0.5)
Eosinophils Relative: 1 %
HCT: 35.5 % — ABNORMAL LOW (ref 36.0–46.0)
Hemoglobin: 11.4 g/dL — ABNORMAL LOW (ref 12.0–15.0)
Immature Granulocytes: 1 %
Lymphocytes Relative: 40 %
Lymphs Abs: 2.1 10*3/uL (ref 0.7–4.0)
MCH: 31.5 pg (ref 26.0–34.0)
MCHC: 32.1 g/dL (ref 30.0–36.0)
MCV: 98.1 fL (ref 80.0–100.0)
Monocytes Absolute: 0.4 10*3/uL (ref 0.1–1.0)
Monocytes Relative: 8 %
Neutro Abs: 2.6 10*3/uL (ref 1.7–7.7)
Neutrophils Relative %: 49 %
Platelets: 445 10*3/uL — ABNORMAL HIGH (ref 150–400)
RBC: 3.62 MIL/uL — ABNORMAL LOW (ref 3.87–5.11)
RDW: 18.2 % — ABNORMAL HIGH (ref 11.5–15.5)
WBC: 5.2 10*3/uL (ref 4.0–10.5)
nRBC: 0.6 % — ABNORMAL HIGH (ref 0.0–0.2)

## 2020-11-28 MED ORDER — ANAGRELIDE HCL 1 MG PO CAPS: 1.0000 mg | ORAL_CAPSULE | Freq: Every day | ORAL | 1 refills | Status: DC

## 2020-11-28 NOTE — Assessment & Plan Note (Signed)
She has multifactorial anemia. She has mild chronic kidney failure that is stable We will observe only for now

## 2020-11-28 NOTE — Assessment & Plan Note (Signed)
I verify she is taking oral vitamin B12 supplement She will continue this

## 2020-11-28 NOTE — Progress Notes (Signed)
[place] OFFICE PROGRESS NOTE  Patient Care Team: [name], MD as PCP - General (Family Medicine)  ASSESSMENT & PLAN:  Essential thrombocythemia With recent addition of hydroxyurea, her platelet count is better controlled She denies missing doses Her platelet count is better She has no signs of worsening anemia She stated she has been compliant taking medications as directed I recommend changing the dose of hydroxyurea. She will skip taking hydroxyurea on Mondays, Wednesdays and Fridays but to take one hydroxyurea daily for the rest of the week, along with anagrelide daily and aspirin  I will see her again in 3 months for further follow-up  Anemia in neoplastic disease She has multifactorial anemia. She has mild chronic kidney failure that is stable We will observe only for now  Vitamin B12 deficiency I verify she is taking oral vitamin B12 supplement She will continue this   No orders of the defined types were placed in this encounter.   All questions were answered. The patient knows to call the clinic with any problems, questions or concerns. The total time spent in the appointment was 20 minutes encounter with patients including review of chart and various tests results, discussions about plan of care and coordination of care plan   [name], MD 11/28/2020 12:27 PM  INTERVAL HISTORY: Please see below for problem oriented charting. She returns for further follow-up She is doing well Denies recent bleeding or infection Her appetite is fair She is taking her medications correctly  SUMMARY OF ONCOLOGIC HISTORY:  I reviewed the patient's records extensive and collaborated the history with the patient. Summary of her history is as follows: This patient was originally diagnosed with myeloproliferative disorder approximately 10 years ago. According to her, she have chronic back pain. MRI showed abnormal bone marrow signal and she was subsequently referred  to see an oncologist. Bone marrow biopsy dated 12/10/2004, confirm essential thrombocytosis. The patient was started on Hydroxyurea 1000 mg daily for 5 days and 1500 mg on Tuesdays and Fridays and anagrelide 0.5 mg twice daily. She has poor compliance which she attributed to major depression. She self discontinued medication for over 6 months in 2016 to remain on aspirin to prevent risk of blood clots. She has frequent sweats which she attributed to menopausal symptoms. The patient denies any recent signs or symptoms of bleeding such as spontaneous epistaxis, hematuria or hematochezia. On 02/01/2015, her treatment is switched to 0.5 mg of anagrelide daily along with aspirin In December 2017, anagrelide is increased to 1 mg daily along with aspirin From 2017-2019, she had intermittent treatment due to loss of follow-up and noncompliance And of April, anagrelide is stopped due to [employer] and she is prescribed hydroxyurea instead 03/03/2019: Her treatment is modified by introducing hydroxyurea 3 times a week along with anagrelide 1 mg daily On 08/31/2020, her treatment is modified by changing hydroxyurea to 4 times a week along with anagrelide 1 mg daily  REVIEW OF SYSTEMS:   Constitutional: Denies fevers, chills or abnormal weight loss Eyes: Denies blurriness of vision Ears, nose, mouth, throat, and face: Denies mucositis or sore throat Respiratory: Denies cough, dyspnea or wheezes Cardiovascular: Denies palpitation, chest discomfort or lower extremity swelling Gastrointestinal:  Denies nausea, heartburn or change in bowel habits Skin: Denies abnormal skin rashes Lymphatics: Denies new lymphadenopathy or easy bruising Neurological:Denies numbness, tingling or new weaknesses Behavioral/Psych: Mood is stable, no new changes  All other systems were reviewed with the patient and are negative.  I have reviewed  the past medical history, past surgical history, social history and family history  with the patient and they are unchanged from previous note.  ALLERGIES:  has No Known Allergies.  MEDICATIONS:  Current Outpatient Medications  Medication Sig Dispense Refill  . ALPRAZolam (XANAX) 0.5 MG tablet Take 0.25 mg by mouth daily as needed for anxiety.    Marland Kitchen anagrelide (AGRYLIN) 1 MG capsule Take 1 capsule (1 mg total) by mouth daily. 90 capsule 1  . aspirin 81 MG tablet Take 1 tablet (81 mg total) by mouth daily. 30 tablet 0  . diclofenac sodium (VOLTAREN) 1 % GEL Apply 2 g topically 4 (four) times daily. 1 Tube 0  . diphenhydramine-acetaminophen (TYLENOL PM) 25-500 MG TABS tablet Take 1 tablet by mouth at bedtime as needed.    . hydroxyurea (HYDREA) 500 MG capsule Take 1 every day except on Mondays, Wednesdays and Fridays, do not take 50 capsule 1  . losartan-hydrochlorothiazide (HYZAAR) 100-25 MG tablet Take 1 tablet by mouth daily.    . metoprolol (LOPRESSOR) 50 MG tablet Take 50 mg by mouth 2 (two) times daily.    . Multiple Vitamin (MULTIVITAMIN) tablet Take 1 tablet by mouth daily.    . vitamin B-12 (CYANOCOBALAMIN) 1000 MCG tablet Take 1 tablet (1,000 mcg total) by mouth daily. 30 tablet 6   No current facility-administered medications for this visit.    PHYSICAL EXAMINATION: ECOG PERFORMANCE STATUS: 0 - Asymptomatic  Vitals:   11/28/20 1055  BP: 117/74  Pulse: 62  Resp: 20  Temp: (!) 97.5 F (36.4 C)  SpO2: 100%   Filed Weights   11/28/20 1055  Weight: 192 lb (87.1 kg)    GENERAL:alert, no distress and comfortable NEURO: alert & oriented x 3 with fluent speech, no focal motor/sensory deficits  LABORATORY DATA:  I have reviewed the data as listed    Component Value Date/Time   NA 139 11/23/2018 1148   NA 141 11/02/2014 1308   K 3.8 11/23/2018 1148   K 3.8 11/02/2014 1308   CL 104 11/23/2018 1148   CL 106 10/06/2012 1002   CO2 24 11/23/2018 1148   CO2 24 11/02/2014 1308   GLUCOSE 118 (H) 11/23/2018 1148   GLUCOSE 99 11/02/2014 1308   GLUCOSE 104  (H) 10/06/2012 1002   BUN 15 11/23/2018 1148   BUN 9.8 11/02/2014 1308   CREATININE 1.12 (H) 11/23/2018 1148   CREATININE 0.9 11/02/2014 1308   CALCIUM 9.6 11/23/2018 1148   CALCIUM 9.2 11/02/2014 1308   PROT 7.7 11/23/2018 1148   PROT 7.1 11/02/2014 1308   ALBUMIN 4.3 11/23/2018 1148   ALBUMIN 4.0 11/02/2014 1308   AST 16 11/23/2018 1148   AST 23 11/02/2014 1308   ALT 14 11/23/2018 1148   ALT 22 11/02/2014 1308   ALKPHOS 81 11/23/2018 1148   ALKPHOS 118 11/02/2014 1308   BILITOT 0.5 11/23/2018 1148   BILITOT 0.34 11/02/2014 1308   GFRNONAA 56 (L) 11/23/2018 1148   GFRAA >60 11/23/2018 1148    No results found for: SPEP, UPEP  Lab Results  Component Value Date   WBC 5.2 11/28/2020   NEUTROABS 2.6 11/28/2020   HGB 11.4 (L) 11/28/2020   HCT 35.5 (L) 11/28/2020   MCV 98.1 11/28/2020   PLT 445 (H) 11/28/2020      Chemistry      Component Value Date/Time   NA 139 11/23/2018 1148   NA 141 11/02/2014 1308   K 3.8 11/23/2018 1148   K 3.8 11/02/2014  1308   CL 104 11/23/2018 1148   CL 106 10/06/2012 1002   CO2 24 11/23/2018 1148   CO2 24 11/02/2014 1308   BUN 15 11/23/2018 1148   BUN 9.8 11/02/2014 1308   CREATININE 1.12 (H) 11/23/2018 1148   CREATININE 0.9 11/02/2014 1308      Component Value Date/Time   CALCIUM 9.6 11/23/2018 1148   CALCIUM 9.2 11/02/2014 1308   ALKPHOS 81 11/23/2018 1148   ALKPHOS 118 11/02/2014 1308   AST 16 11/23/2018 1148   AST 23 11/02/2014 1308   ALT 14 11/23/2018 1148   ALT 22 11/02/2014 1308   BILITOT 0.5 11/23/2018 1148   BILITOT 0.34 11/02/2014 1308

## 2020-11-28 NOTE — Assessment & Plan Note (Signed)
With recent addition of hydroxyurea, her platelet count is better controlled She denies missing doses Her platelet count is better She has no signs of worsening anemia She stated she has been compliant taking medications as directed I recommend changing the dose of hydroxyurea. She will skip taking hydroxyurea on Mondays, Wednesdays and Fridays but to take one hydroxyurea daily for the rest of the week, along with anagrelide daily and aspirin  I will see her again in 3 months for further follow-up

## 2021-02-03 DIAGNOSIS — Z20822 Contact with and (suspected) exposure to covid-19: Secondary | ICD-10-CM | POA: Diagnosis not present

## 2021-02-22 DIAGNOSIS — M545 Low back pain, unspecified: Secondary | ICD-10-CM | POA: Diagnosis not present

## 2021-02-22 DIAGNOSIS — S339XXA Sprain of unspecified parts of lumbar spine and pelvis, initial encounter: Secondary | ICD-10-CM | POA: Diagnosis not present

## 2021-02-22 DIAGNOSIS — R8281 Pyuria: Secondary | ICD-10-CM | POA: Diagnosis not present

## 2021-02-25 DIAGNOSIS — M533 Sacrococcygeal disorders, not elsewhere classified: Secondary | ICD-10-CM | POA: Diagnosis not present

## 2021-02-25 DIAGNOSIS — M5136 Other intervertebral disc degeneration, lumbar region: Secondary | ICD-10-CM | POA: Diagnosis not present

## 2021-02-25 DIAGNOSIS — M545 Low back pain, unspecified: Secondary | ICD-10-CM | POA: Diagnosis not present

## 2021-02-28 ENCOUNTER — Other Ambulatory Visit: Payer: Self-pay

## 2021-02-28 ENCOUNTER — Inpatient Hospital Stay (HOSPITAL_BASED_OUTPATIENT_CLINIC_OR_DEPARTMENT_OTHER): Payer: Federal, State, Local not specified - PPO | Admitting: Hematology and Oncology

## 2021-02-28 ENCOUNTER — Inpatient Hospital Stay: Payer: Federal, State, Local not specified - PPO | Attending: Hematology and Oncology

## 2021-02-28 ENCOUNTER — Encounter: Payer: Self-pay | Admitting: Hematology and Oncology

## 2021-02-28 DIAGNOSIS — D473 Essential (hemorrhagic) thrombocythemia: Secondary | ICD-10-CM | POA: Diagnosis not present

## 2021-02-28 DIAGNOSIS — Z79899 Other long term (current) drug therapy: Secondary | ICD-10-CM | POA: Insufficient documentation

## 2021-02-28 DIAGNOSIS — N182 Chronic kidney disease, stage 2 (mild): Secondary | ICD-10-CM | POA: Insufficient documentation

## 2021-02-28 DIAGNOSIS — D649 Anemia, unspecified: Secondary | ICD-10-CM | POA: Insufficient documentation

## 2021-02-28 DIAGNOSIS — Z7982 Long term (current) use of aspirin: Secondary | ICD-10-CM | POA: Insufficient documentation

## 2021-02-28 DIAGNOSIS — D630 Anemia in neoplastic disease: Secondary | ICD-10-CM | POA: Diagnosis not present

## 2021-02-28 DIAGNOSIS — E538 Deficiency of other specified B group vitamins: Secondary | ICD-10-CM

## 2021-02-28 LAB — CBC WITH DIFFERENTIAL/PLATELET
Abs Immature Granulocytes: 0.12 10*3/uL — ABNORMAL HIGH (ref 0.00–0.07)
Basophils Absolute: 0 10*3/uL (ref 0.0–0.1)
Basophils Relative: 0 %
Eosinophils Absolute: 0 10*3/uL (ref 0.0–0.5)
Eosinophils Relative: 0 %
HCT: 35.9 % — ABNORMAL LOW (ref 36.0–46.0)
Hemoglobin: 11.8 g/dL — ABNORMAL LOW (ref 12.0–15.0)
Immature Granulocytes: 1 %
Lymphocytes Relative: 21 %
Lymphs Abs: 2.5 10*3/uL (ref 0.7–4.0)
MCH: 31.8 pg (ref 26.0–34.0)
MCHC: 32.9 g/dL (ref 30.0–36.0)
MCV: 96.8 fL (ref 80.0–100.0)
Monocytes Absolute: 0.7 10*3/uL (ref 0.1–1.0)
Monocytes Relative: 6 %
Neutro Abs: 8.5 10*3/uL — ABNORMAL HIGH (ref 1.7–7.7)
Neutrophils Relative %: 72 %
Platelets: 577 10*3/uL — ABNORMAL HIGH (ref 150–400)
RBC: 3.71 MIL/uL — ABNORMAL LOW (ref 3.87–5.11)
RDW: 16.3 % — ABNORMAL HIGH (ref 11.5–15.5)
WBC: 11.8 10*3/uL — ABNORMAL HIGH (ref 4.0–10.5)
nRBC: 0 % (ref 0.0–0.2)

## 2021-02-28 MED ORDER — HYDROXYUREA 500 MG PO CAPS: ORAL_CAPSULE | ORAL | 1 refills | Status: DC

## 2021-02-28 NOTE — Assessment & Plan Note (Signed)
She has history of vitamin B12 deficiency I will check B12 level in her next visit

## 2021-02-28 NOTE — Progress Notes (Signed)
[place] OFFICE PROGRESS NOTE  Patient Care Team: [name], MD as PCP - General (Family Medicine)  ASSESSMENT & PLAN:  Essential thrombocythemia [facility]) She has slight worsening thrombocytosis Her mild leukocytosis could be due to recent steroids I recommend changing the hydroxyurea dose to 500 mg daily except Saturday and Sunday she does not take hydroxyurea I plan to see her again in 3 months I reinforced the importance of compliance If she has difficulties getting her prescription filled in the future, she is instructed to call me She will continue aspirin therapy to prevent risk of blood clots  Anemia in neoplastic disease She has multifactorial anemia. She has mild chronic kidney failure that is stable We will observe only for now  Vitamin B12 deficiency She has history of vitamin B12 deficiency I will check B12 level in her next visit  Orders Placed This Encounter  Procedures   Vitamin B12    Standing Status:   Standing    Number of Occurrences:   1    Standing Expiration Date:   02/28/2022    All questions were answered. The patient knows to call the clinic with any problems, questions or concerns. The total time spent in the appointment was 20 minutes encounter with patients including review of chart and various tests results, discussions about plan of care and coordination of care plan   [name], MD 02/28/2021 12:13 PM  INTERVAL HISTORY: Please see below for problem oriented charting. She stated she is undergoing a lot of stress She is currently taking prednisone She missed taking anagrelide for a month due to difficulties getting refills around June No recent bleeding  SUMMARY OF ONCOLOGIC HISTORY:  I reviewed the patient's records extensive and collaborated the history with the patient. Summary of her history is as follows: This patient was originally diagnosed with myeloproliferative disorder approximately 10 years ago. According to her,  she have chronic back pain. MRI showed abnormal bone marrow signal and she was subsequently referred to see an oncologist. Bone marrow biopsy dated 12/10/2004, confirm essential thrombocytosis. The patient was started on Hydroxyurea 1000 mg daily for 5 days and 1500 mg on Tuesdays and Fridays and anagrelide 0.5 mg twice daily. She has poor compliance which she attributed to major depression. She self discontinued medication for over 6 months in 2016 to remain on aspirin to prevent risk of blood clots. She has frequent sweats which she attributed to menopausal symptoms. The patient denies any recent signs or symptoms of bleeding such as spontaneous epistaxis, hematuria or hematochezia. On 02/01/2015, her treatment is switched to 0.5 mg of anagrelide daily along with aspirin In December 2017, anagrelide is increased to 1 mg daily along with aspirin From 2017-2019, she had intermittent treatment due to loss of follow-up and noncompliance And of April, anagrelide is stopped due to [employer] and she is prescribed hydroxyurea instead 03/03/2019: Her treatment is modified by introducing hydroxyurea 3 times a week along with anagrelide 1 mg daily On 08/31/2020, her treatment is modified by changing hydroxyurea to 4 times a week along with anagrelide 1 mg daily On 02/28/2021, her treatment is modified by changing hydroxyurea to daily 500 mg from Monday to Friday and skip Saturday and Sundays and to continue anagrelide at 1 mg daily  REVIEW OF SYSTEMS:   Constitutional: Denies fevers, chills or abnormal weight loss Eyes: Denies blurriness of vision Ears, nose, mouth, throat, and face: Denies mucositis or sore throat Respiratory: Denies cough, dyspnea or wheezes Cardiovascular: Denies  palpitation, chest discomfort or lower extremity swelling Gastrointestinal:  Denies nausea, heartburn or change in bowel habits Skin: Denies abnormal skin rashes Lymphatics: Denies new lymphadenopathy or easy  bruising Neurological:Denies numbness, tingling or new weaknesses Behavioral/Psych: Mood is stable, no new changes  All other systems were reviewed with the patient and are negative.  I have reviewed the past medical history, past surgical history, social history and family history with the patient and they are unchanged from previous note.  ALLERGIES:  has No Known Allergies.  MEDICATIONS:  Current Outpatient Medications  Medication Sig Dispense Refill   ALPRAZolam (XANAX) 0.5 MG tablet Take 0.25 mg by mouth daily as needed for anxiety.     anagrelide (AGRYLIN) 1 MG capsule Take 1 capsule (1 mg total) by mouth daily. 90 capsule 1   aspirin 81 MG tablet Take 1 tablet (81 mg total) by mouth daily. 30 tablet 0   diclofenac sodium (VOLTAREN) 1 % GEL Apply 2 g topically 4 (four) times daily. 1 Tube 0   diphenhydramine-acetaminophen (TYLENOL PM) 25-500 MG TABS tablet Take 1 tablet by mouth at bedtime as needed.     hydroxyurea (HYDREA) 500 MG capsule Take 1 every day except on Saturdays and Sundays, do not take 100 capsule 1   losartan-hydrochlorothiazide (HYZAAR) 100-25 MG tablet Take 1 tablet by mouth daily.     metoprolol (LOPRESSOR) 50 MG tablet Take 50 mg by mouth 2 (two) times daily.     Multiple Vitamin (MULTIVITAMIN) tablet Take 1 tablet by mouth daily.     vitamin B-12 (CYANOCOBALAMIN) 1000 MCG tablet Take 1 tablet (1,000 mcg total) by mouth daily. 30 tablet 6   No current facility-administered medications for this visit.    PHYSICAL EXAMINATION: ECOG PERFORMANCE STATUS: 1 - Symptomatic but completely ambulatory  Vitals:   02/28/21 1135  BP: (!) 133/95  Pulse: (!) 57  Resp: 18  Temp: (!) 97.4 F (36.3 C)  SpO2: 100%   Filed Weights   02/28/21 1135  Weight: 189 lb 6.4 oz (85.9 kg)    GENERAL:alert, no distress and comfortable SKIN: skin color, texture, turgor are normal, no rashes or significant lesions EYES: normal, Conjunctiva are pink and non-injected, sclera  clear OROPHARYNX:no exudate, no erythema and lips, buccal mucosa, and tongue normal  NECK: supple, thyroid normal size, non-tender, without nodularity LYMPH:  no palpable lymphadenopathy in the cervical, axillary or inguinal LUNGS: clear to auscultation and percussion with normal breathing effort HEART: regular rate & rhythm and no murmurs and no lower extremity edema ABDOMEN:abdomen soft, non-tender and normal bowel sounds Musculoskeletal:no cyanosis of digits and no clubbing  NEURO: alert & oriented x 3 with fluent speech, no focal motor/sensory deficits  LABORATORY DATA:  I have reviewed the data as listed    Component Value Date/Time   NA 139 11/23/2018 1148   NA 141 11/02/2014 1308   K 3.8 11/23/2018 1148   K 3.8 11/02/2014 1308   CL 104 11/23/2018 1148   CL 106 10/06/2012 1002   CO2 24 11/23/2018 1148   CO2 24 11/02/2014 1308   GLUCOSE 118 (H) 11/23/2018 1148   GLUCOSE 99 11/02/2014 1308   GLUCOSE 104 (H) 10/06/2012 1002   BUN 15 11/23/2018 1148   BUN 9.8 11/02/2014 1308   CREATININE 1.12 (H) 11/23/2018 1148   CREATININE 0.9 11/02/2014 1308   CALCIUM 9.6 11/23/2018 1148   CALCIUM 9.2 11/02/2014 1308   PROT 7.7 11/23/2018 1148   PROT 7.1 11/02/2014 1308   ALBUMIN 4.3 11/23/2018  1148   ALBUMIN 4.0 11/02/2014 1308   AST 16 11/23/2018 1148   AST 23 11/02/2014 1308   ALT 14 11/23/2018 1148   ALT 22 11/02/2014 1308   ALKPHOS 81 11/23/2018 1148   ALKPHOS 118 11/02/2014 1308   BILITOT 0.5 11/23/2018 1148   BILITOT 0.34 11/02/2014 1308   GFRNONAA 56 (L) 11/23/2018 1148   GFRAA >60 11/23/2018 1148    No results found for: SPEP, UPEP  Lab Results  Component Value Date   WBC 11.8 (H) 02/28/2021   NEUTROABS 8.5 (H) 02/28/2021   HGB 11.8 (L) 02/28/2021   HCT 35.9 (L) 02/28/2021   MCV 96.8 02/28/2021   PLT 577 (H) 02/28/2021      Chemistry      Component Value Date/Time   NA 139 11/23/2018 1148   NA 141 11/02/2014 1308   K 3.8 11/23/2018 1148   K 3.8 11/02/2014  1308   CL 104 11/23/2018 1148   CL 106 10/06/2012 1002   CO2 24 11/23/2018 1148   CO2 24 11/02/2014 1308   BUN 15 11/23/2018 1148   BUN 9.8 11/02/2014 1308   CREATININE 1.12 (H) 11/23/2018 1148   CREATININE 0.9 11/02/2014 1308      Component Value Date/Time   CALCIUM 9.6 11/23/2018 1148   CALCIUM 9.2 11/02/2014 1308   ALKPHOS 81 11/23/2018 1148   ALKPHOS 118 11/02/2014 1308   AST 16 11/23/2018 1148   AST 23 11/02/2014 1308   ALT 14 11/23/2018 1148   ALT 22 11/02/2014 1308   BILITOT 0.5 11/23/2018 1148   BILITOT 0.34 11/02/2014 1308

## 2021-02-28 NOTE — Assessment & Plan Note (Signed)
She has slight worsening thrombocytosis Her mild leukocytosis could be due to recent steroids I recommend changing the hydroxyurea dose to 500 mg daily except Saturday and Sunday she does not take hydroxyurea I plan to see her again in 3 months I reinforced the importance of compliance If she has difficulties getting her prescription filled in the future, she is instructed to call me She will continue aspirin therapy to prevent risk of blood clots

## 2021-02-28 NOTE — Assessment & Plan Note (Signed)
She has multifactorial anemia. She has mild chronic kidney failure that is stable We will observe only for now

## 2021-03-01 ENCOUNTER — Telehealth: Payer: Self-pay | Admitting: Hematology and Oncology

## 2021-03-01 NOTE — Telephone Encounter (Signed)
Scheduled per 8/4 sch msg. Called and spoke with pt confirmed 11/4 appts

## 2021-03-06 ENCOUNTER — Encounter: Payer: Self-pay | Admitting: Hematology and Oncology

## 2021-03-06 DIAGNOSIS — Z20822 Contact with and (suspected) exposure to covid-19: Secondary | ICD-10-CM | POA: Diagnosis not present

## 2021-03-07 ENCOUNTER — Telehealth: Payer: Self-pay

## 2021-03-07 DIAGNOSIS — U071 COVID-19: Secondary | ICD-10-CM | POA: Diagnosis not present

## 2021-03-07 NOTE — Telephone Encounter (Signed)
-----   Message from [name], MD sent at 03/07/2021 10:45 AM EDT ----- Pls tell her not to send mychart msg for not feeling well Call her; did she get checked for Covid yet?>

## 2021-03-07 NOTE — Telephone Encounter (Signed)
Called back and given below message. She verbalized understanding and will go to urgent care now.

## 2021-03-07 NOTE — Telephone Encounter (Signed)
Called and given below message. She verbalized understanding. She did a home covid test on Tuesday and Wednesday, both negative. Yesterday she went to [facility] and did another covid test, she is still waiting on results. Fever today 100.5, it was higher and she took tylenol. She is taking cough syrup as needed. She has a cough and hoarseness. She is drinking lots of fluids. Denies UTI symptoms.

## 2021-03-07 NOTE — Telephone Encounter (Signed)
She called back and left a message. She went to urgent care and she is positive for covid. They are sending a Rx for Paxlovid to pharmacy.  FYI

## 2021-03-07 NOTE — Telephone Encounter (Signed)
Sounds like Covid She needs to go to urgent care

## 2021-03-10 DIAGNOSIS — U071 COVID-19: Secondary | ICD-10-CM | POA: Diagnosis not present

## 2021-03-19 DIAGNOSIS — R399 Unspecified symptoms and signs involving the genitourinary system: Secondary | ICD-10-CM | POA: Diagnosis not present

## 2021-03-19 DIAGNOSIS — I10 Essential (primary) hypertension: Secondary | ICD-10-CM | POA: Diagnosis not present

## 2021-03-19 DIAGNOSIS — G43109 Migraine with aura, not intractable, without status migrainosus: Secondary | ICD-10-CM | POA: Diagnosis not present

## 2021-05-14 ENCOUNTER — Other Ambulatory Visit (HOSPITAL_COMMUNITY): Payer: Self-pay

## 2021-05-14 ENCOUNTER — Other Ambulatory Visit: Payer: Self-pay

## 2021-05-14 ENCOUNTER — Telehealth: Payer: Self-pay

## 2021-05-14 MED ORDER — ANAGRELIDE HCL 1 MG PO CAPS
1.0000 mg | ORAL_CAPSULE | Freq: Every day | ORAL | 1 refills | Status: DC
  Filled 2021-05-14 – 2021-05-23 (×2): qty 90, 90d supply, fill #0
  Filled 2021-09-10 – 2021-09-26 (×2): qty 90, 90d supply, fill #1

## 2021-05-14 NOTE — Telephone Encounter (Signed)
Returned her call. [facility] only has 5 anagrelide pills. She ask that Rx be sent to [facility] outpatient pharmacy. Rx sent and she is aware.

## 2021-05-15 ENCOUNTER — Other Ambulatory Visit (HOSPITAL_COMMUNITY): Payer: Self-pay

## 2021-05-23 ENCOUNTER — Other Ambulatory Visit (HOSPITAL_COMMUNITY): Payer: Self-pay

## 2021-05-27 ENCOUNTER — Inpatient Hospital Stay: Payer: Federal, State, Local not specified - PPO | Admitting: Hematology and Oncology

## 2021-05-27 ENCOUNTER — Inpatient Hospital Stay: Payer: Federal, State, Local not specified - PPO

## 2021-05-31 ENCOUNTER — Inpatient Hospital Stay: Payer: Federal, State, Local not specified - PPO | Admitting: Hematology and Oncology

## 2021-05-31 ENCOUNTER — Inpatient Hospital Stay: Payer: Federal, State, Local not specified - PPO

## 2021-06-07 ENCOUNTER — Inpatient Hospital Stay: Payer: Federal, State, Local not specified - PPO | Attending: Hematology and Oncology

## 2021-06-07 ENCOUNTER — Other Ambulatory Visit: Payer: Self-pay

## 2021-06-07 ENCOUNTER — Inpatient Hospital Stay (HOSPITAL_BASED_OUTPATIENT_CLINIC_OR_DEPARTMENT_OTHER): Payer: Federal, State, Local not specified - PPO | Admitting: Hematology and Oncology

## 2021-06-07 ENCOUNTER — Other Ambulatory Visit: Payer: Federal, State, Local not specified - PPO

## 2021-06-07 ENCOUNTER — Ambulatory Visit: Payer: Federal, State, Local not specified - PPO | Admitting: Hematology and Oncology

## 2021-06-07 ENCOUNTER — Encounter: Payer: Self-pay | Admitting: Hematology and Oncology

## 2021-06-07 VITALS — BP 119/81 | HR 61 | Resp 18 | Ht 70.0 in | Wt 181.4 lb

## 2021-06-07 DIAGNOSIS — D473 Essential (hemorrhagic) thrombocythemia: Secondary | ICD-10-CM

## 2021-06-07 DIAGNOSIS — E538 Deficiency of other specified B group vitamins: Secondary | ICD-10-CM

## 2021-06-07 DIAGNOSIS — Z79899 Other long term (current) drug therapy: Secondary | ICD-10-CM | POA: Diagnosis not present

## 2021-06-07 DIAGNOSIS — Z7982 Long term (current) use of aspirin: Secondary | ICD-10-CM | POA: Diagnosis not present

## 2021-06-07 LAB — CBC WITH DIFFERENTIAL/PLATELET
Abs Immature Granulocytes: 0.03 10*3/uL (ref 0.00–0.07)
Basophils Absolute: 0.1 10*3/uL (ref 0.0–0.1)
Basophils Relative: 1 %
Eosinophils Absolute: 0 10*3/uL (ref 0.0–0.5)
Eosinophils Relative: 1 %
HCT: 33.9 % — ABNORMAL LOW (ref 36.0–46.0)
Hemoglobin: 10.9 g/dL — ABNORMAL LOW (ref 12.0–15.0)
Immature Granulocytes: 1 %
Lymphocytes Relative: 36 %
Lymphs Abs: 2 10*3/uL (ref 0.7–4.0)
MCH: 31.8 pg (ref 26.0–34.0)
MCHC: 32.2 g/dL (ref 30.0–36.0)
MCV: 98.8 fL (ref 80.0–100.0)
Monocytes Absolute: 0.4 10*3/uL (ref 0.1–1.0)
Monocytes Relative: 7 %
Neutro Abs: 3.1 10*3/uL (ref 1.7–7.7)
Neutrophils Relative %: 54 %
Platelets: 532 10*3/uL — ABNORMAL HIGH (ref 150–400)
RBC: 3.43 MIL/uL — ABNORMAL LOW (ref 3.87–5.11)
RDW: 15.9 % — ABNORMAL HIGH (ref 11.5–15.5)
WBC: 5.7 10*3/uL (ref 4.0–10.5)
nRBC: 0.4 % — ABNORMAL HIGH (ref 0.0–0.2)

## 2021-06-07 LAB — VITAMIN B12: Vitamin B-12: 1095 pg/mL — ABNORMAL HIGH (ref 180–914)

## 2021-06-07 MED ORDER — HYDROXYUREA 500 MG PO CAPS: ORAL_CAPSULE | ORAL | 1 refills | Status: DC

## 2021-06-07 NOTE — Assessment & Plan Note (Signed)
The patient only missed anagrelide for 1 week due to prescription ran out Since we increased the dose of hydroxyurea from the last visit, we have better control of her platelet count However, her platelet count is still well over 500 and there is high risk of thrombosis We discussed the risk and benefits of adding additional hydroxyurea so that she will be taking 500 mg daily and just skip Sunday instead of skipping over the weekend and she is in agreement to proceed with additional pill I plan to see her again in 3 months I reinforced the importance of compliance If she has difficulties getting her prescription filled in the future, she is instructed to call me She will continue aspirin therapy to prevent risk of blood clots

## 2021-06-07 NOTE — Progress Notes (Signed)
[place] OFFICE PROGRESS NOTE  Patient Care Team: [name], MD (Inactive) as PCP - General (Family Medicine)  ASSESSMENT & PLAN:  Essential thrombocythemia [facility]) The patient only missed anagrelide for 1 week due to prescription ran out Since we increased the dose of hydroxyurea from the last visit, we have better control of her platelet count However, her platelet count is still well over 500 and there is high risk of thrombosis We discussed the risk and benefits of adding additional hydroxyurea so that she will be taking 500 mg daily and just skip Sunday instead of skipping over the weekend and she is in agreement to proceed with additional pill I plan to see her again in 3 months I reinforced the importance of compliance If she has difficulties getting her prescription filled in the future, she is instructed to call me She will continue aspirin therapy to prevent risk of blood clots  Orders Placed This Encounter  Procedures   CBC with Differential/Platelet    Standing Status:   Standing    Number of Occurrences:   22    Standing Expiration Date:   06/07/2022    All questions were answered. The patient knows to call the clinic with any problems, questions or concerns. The total time spent in the appointment was 20 minutes encounter with patients including review of chart and various tests results, discussions about plan of care and coordination of care plan   [name], MD 06/07/2021 4:16 PM  INTERVAL HISTORY: Please see below for problem oriented charting. she returns for treatment follow-up She missed 1 dose of anagrelide due to prescription ran out No recent infection, fever or chills She complains of fatigue  REVIEW OF SYSTEMS:   Constitutional: Denies fevers, chills or abnormal weight loss Eyes: Denies blurriness of vision Ears, nose, mouth, throat, and face: Denies mucositis or sore throat Respiratory: Denies cough, dyspnea or  wheezes Cardiovascular: Denies palpitation, chest discomfort or lower extremity swelling Gastrointestinal:  Denies nausea, heartburn or change in bowel habits Skin: Denies abnormal skin rashes Lymphatics: Denies new lymphadenopathy or easy bruising Neurological:Denies numbness, tingling or new weaknesses Behavioral/Psych: Mood is stable, no new changes  All other systems were reviewed with the patient and are negative.  I have reviewed the past medical history, past surgical history, social history and family history with the patient and they are unchanged from previous note.  ALLERGIES:  has No Known Allergies.  MEDICATIONS:  Current Outpatient Medications  Medication Sig Dispense Refill   ALPRAZolam (XANAX) 0.5 MG tablet Take 0.25 mg by mouth daily as needed for anxiety.     anagrelide (AGRYLIN) 1 MG capsule Take 1 capsule (1 mg total) by mouth daily. 90 capsule 1   aspirin 81 MG tablet Take 1 tablet (81 mg total) by mouth daily. 30 tablet 0   diclofenac sodium (VOLTAREN) 1 % GEL Apply 2 g topically 4 (four) times daily. 1 Tube 0   diphenhydramine-acetaminophen (TYLENOL PM) 25-500 MG TABS tablet Take 1 tablet by mouth at bedtime as needed.     hydroxyurea (HYDREA) 500 MG capsule Take 1 every day except on Sundays, do not take 100 capsule 1   losartan-hydrochlorothiazide (HYZAAR) 100-25 MG tablet Take 1 tablet by mouth daily.     metoprolol (LOPRESSOR) 50 MG tablet Take 50 mg by mouth 2 (two) times daily.     Multiple Vitamin (MULTIVITAMIN) tablet Take 1 tablet by mouth daily.     vitamin B-12 (CYANOCOBALAMIN) 1000 MCG tablet Take  1 tablet (1,000 mcg total) by mouth daily. 30 tablet 6   No current facility-administered medications for this visit.    SUMMARY OF ONCOLOGIC HISTORY:  I reviewed the patient's records extensive and collaborated the history with the patient. Summary of her history is as follows: This patient was originally diagnosed with myeloproliferative disorder  approximately 10 years ago. According to her, she have chronic back pain. MRI showed abnormal bone marrow signal and she was subsequently referred to see an oncologist. Bone marrow biopsy dated 12/10/2004, confirm essential thrombocytosis. The patient was started on Hydroxyurea 1000 mg daily for 5 days and 1500 mg on Tuesdays and Fridays and anagrelide 0.5 mg twice daily. She has poor compliance which she attributed to major depression. She self discontinued medication for over 6 months in 2016 to remain on aspirin to prevent risk of blood clots. She has frequent sweats which she attributed to menopausal symptoms. The patient denies any recent signs or symptoms of bleeding such as spontaneous epistaxis, hematuria or hematochezia. On 02/01/2015, her treatment is switched to 0.5 mg of anagrelide daily along with aspirin In December 2017, anagrelide is increased to 1 mg daily along with aspirin From 2017-2019, she had intermittent treatment due to loss of follow-up and noncompliance And of April, anagrelide is stopped due to national shortage and she is prescribed hydroxyurea instead 03/03/2019: Her treatment is modified by introducing hydroxyurea 3 times a week along with anagrelide 1 mg daily On 08/31/2020, her treatment is modified by changing hydroxyurea to 4 times a week along with anagrelide 1 mg daily On 02/28/2021, her treatment is modified by changing hydroxyurea to daily 500 mg from Monday to Friday and skip Saturday and Sundays and to continue anagrelide at 1 mg daily On June 07, 2021, her treatment is modified by changing hydroxyurea to daily 500 mg except skip on Sundays and to continue anagrelide at 1 mg daily  PHYSICAL EXAMINATION: ECOG PERFORMANCE STATUS: 1 - Symptomatic but completely ambulatory  Vitals:   06/07/21 1227  BP: 119/81  Pulse: 61  Resp: 18  SpO2: 100%   Filed Weights   06/07/21 1227  Weight: 181 lb 6.4 oz (82.3 kg)    GENERAL:alert, no distress and  comfortable NEURO: alert & oriented x 3 with fluent speech, no focal motor/sensory deficits  LABORATORY DATA:  I have reviewed the data as listed    Component Value Date/Time   NA 139 11/23/2018 1148   NA 141 11/02/2014 1308   K 3.8 11/23/2018 1148   K 3.8 11/02/2014 1308   CL 104 11/23/2018 1148   CL 106 10/06/2012 1002   CO2 24 11/23/2018 1148   CO2 24 11/02/2014 1308   GLUCOSE 118 (H) 11/23/2018 1148   GLUCOSE 99 11/02/2014 1308   GLUCOSE 104 (H) 10/06/2012 1002   BUN 15 11/23/2018 1148   BUN 9.8 11/02/2014 1308   CREATININE 1.12 (H) 11/23/2018 1148   CREATININE 0.9 11/02/2014 1308   CALCIUM 9.6 11/23/2018 1148   CALCIUM 9.2 11/02/2014 1308   PROT 7.7 11/23/2018 1148   PROT 7.1 11/02/2014 1308   ALBUMIN 4.3 11/23/2018 1148   ALBUMIN 4.0 11/02/2014 1308   AST 16 11/23/2018 1148   AST 23 11/02/2014 1308   ALT 14 11/23/2018 1148   ALT 22 11/02/2014 1308   ALKPHOS 81 11/23/2018 1148   ALKPHOS 118 11/02/2014 1308   BILITOT 0.5 11/23/2018 1148   BILITOT 0.34 11/02/2014 1308   GFRNONAA 56 (L) 11/23/2018 1148   GFRAA >60 11/23/2018  1148    No results found for: SPEP, UPEP  Lab Results  Component Value Date   WBC 5.7 06/07/2021   NEUTROABS 3.1 06/07/2021   HGB 10.9 (L) 06/07/2021   HCT 33.9 (L) 06/07/2021   MCV 98.8 06/07/2021   PLT 532 (H) 06/07/2021      Chemistry      Component Value Date/Time   NA 139 11/23/2018 1148   NA 141 11/02/2014 1308   K 3.8 11/23/2018 1148   K 3.8 11/02/2014 1308   CL 104 11/23/2018 1148   CL 106 10/06/2012 1002   CO2 24 11/23/2018 1148   CO2 24 11/02/2014 1308   BUN 15 11/23/2018 1148   BUN 9.8 11/02/2014 1308   CREATININE 1.12 (H) 11/23/2018 1148   CREATININE 0.9 11/02/2014 1308      Component Value Date/Time   CALCIUM 9.6 11/23/2018 1148   CALCIUM 9.2 11/02/2014 1308   ALKPHOS 81 11/23/2018 1148   ALKPHOS 118 11/02/2014 1308   AST 16 11/23/2018 1148   AST 23 11/02/2014 1308   ALT 14 11/23/2018 1148   ALT 22  11/02/2014 1308   BILITOT 0.5 11/23/2018 1148   BILITOT 0.34 11/02/2014 1308

## 2021-09-10 ENCOUNTER — Telehealth: Payer: Self-pay | Admitting: Hematology and Oncology

## 2021-09-10 ENCOUNTER — Other Ambulatory Visit (HOSPITAL_COMMUNITY): Payer: Self-pay

## 2021-09-10 NOTE — Telephone Encounter (Signed)
Per 2/14 inbasket,pt req to r/s, left msg

## 2021-09-12 ENCOUNTER — Other Ambulatory Visit: Payer: Federal, State, Local not specified - PPO

## 2021-09-12 ENCOUNTER — Ambulatory Visit: Payer: Federal, State, Local not specified - PPO | Admitting: Hematology and Oncology

## 2021-09-16 ENCOUNTER — Other Ambulatory Visit (HOSPITAL_COMMUNITY): Payer: Self-pay

## 2021-09-18 ENCOUNTER — Other Ambulatory Visit (HOSPITAL_COMMUNITY): Payer: Self-pay

## 2021-09-20 ENCOUNTER — Other Ambulatory Visit (HOSPITAL_COMMUNITY): Payer: Self-pay

## 2021-09-26 ENCOUNTER — Ambulatory Visit: Payer: Federal, State, Local not specified - PPO | Admitting: Hematology and Oncology

## 2021-09-26 ENCOUNTER — Other Ambulatory Visit (HOSPITAL_COMMUNITY): Payer: Self-pay

## 2021-09-26 ENCOUNTER — Other Ambulatory Visit: Payer: Federal, State, Local not specified - PPO

## 2021-10-11 ENCOUNTER — Telehealth: Payer: Self-pay

## 2021-10-11 ENCOUNTER — Ambulatory Visit: Payer: Federal, State, Local not specified - PPO | Admitting: Hematology and Oncology

## 2021-10-11 ENCOUNTER — Inpatient Hospital Stay: Payer: Federal, State, Local not specified - PPO

## 2021-10-11 NOTE — Telephone Encounter (Signed)
Called regarding call to after hours to cancel today's appts. Appt canceled. She has been exposed to covid and will do a covid test. She will call back to reschedule appts. ?

## 2022-01-02 ENCOUNTER — Other Ambulatory Visit: Payer: Self-pay | Admitting: Hematology and Oncology

## 2022-01-03 ENCOUNTER — Other Ambulatory Visit (HOSPITAL_COMMUNITY): Payer: Self-pay

## 2022-01-03 ENCOUNTER — Telehealth: Payer: Self-pay

## 2022-01-03 NOTE — Telephone Encounter (Signed)
Patient called to schedule follow-up visit and labs with Dr. Alvy Bimler in order to refill medications. Patient aware of the appointment dates and times.

## 2022-01-06 ENCOUNTER — Inpatient Hospital Stay (HOSPITAL_BASED_OUTPATIENT_CLINIC_OR_DEPARTMENT_OTHER): Payer: Federal, State, Local not specified - PPO | Admitting: Hematology and Oncology

## 2022-01-06 ENCOUNTER — Other Ambulatory Visit (HOSPITAL_COMMUNITY): Payer: Self-pay

## 2022-01-06 ENCOUNTER — Other Ambulatory Visit: Payer: Self-pay

## 2022-01-06 ENCOUNTER — Inpatient Hospital Stay: Payer: Federal, State, Local not specified - PPO | Attending: Hematology and Oncology

## 2022-01-06 ENCOUNTER — Encounter: Payer: Self-pay | Admitting: Hematology and Oncology

## 2022-01-06 DIAGNOSIS — D473 Essential (hemorrhagic) thrombocythemia: Secondary | ICD-10-CM | POA: Insufficient documentation

## 2022-01-06 DIAGNOSIS — Z91148 Patient's other noncompliance with medication regimen for other reason: Secondary | ICD-10-CM | POA: Diagnosis not present

## 2022-01-06 DIAGNOSIS — D630 Anemia in neoplastic disease: Secondary | ICD-10-CM | POA: Diagnosis not present

## 2022-01-06 DIAGNOSIS — Z79899 Other long term (current) drug therapy: Secondary | ICD-10-CM | POA: Diagnosis not present

## 2022-01-06 DIAGNOSIS — D649 Anemia, unspecified: Secondary | ICD-10-CM | POA: Diagnosis not present

## 2022-01-06 DIAGNOSIS — N182 Chronic kidney disease, stage 2 (mild): Secondary | ICD-10-CM | POA: Insufficient documentation

## 2022-01-06 LAB — CBC WITH DIFFERENTIAL/PLATELET
Abs Immature Granulocytes: 0.02 10*3/uL (ref 0.00–0.07)
Basophils Absolute: 0 10*3/uL (ref 0.0–0.1)
Basophils Relative: 1 %
Eosinophils Absolute: 0 10*3/uL (ref 0.0–0.5)
Eosinophils Relative: 0 %
HCT: 35.5 % — ABNORMAL LOW (ref 36.0–46.0)
Hemoglobin: 11.9 g/dL — ABNORMAL LOW (ref 12.0–15.0)
Immature Granulocytes: 0 %
Lymphocytes Relative: 50 %
Lymphs Abs: 2.3 10*3/uL (ref 0.7–4.0)
MCH: 33.3 pg (ref 26.0–34.0)
MCHC: 33.5 g/dL (ref 30.0–36.0)
MCV: 99.4 fL (ref 80.0–100.0)
Monocytes Absolute: 0.4 10*3/uL (ref 0.1–1.0)
Monocytes Relative: 8 %
Neutro Abs: 1.9 10*3/uL (ref 1.7–7.7)
Neutrophils Relative %: 41 %
Platelets: 442 10*3/uL — ABNORMAL HIGH (ref 150–400)
RBC: 3.57 MIL/uL — ABNORMAL LOW (ref 3.87–5.11)
RDW: 14.9 % (ref 11.5–15.5)
WBC: 4.6 10*3/uL (ref 4.0–10.5)
nRBC: 0 % (ref 0.0–0.2)

## 2022-01-06 MED ORDER — HYDROXYUREA 500 MG PO CAPS
ORAL_CAPSULE | ORAL | 1 refills | Status: DC
  Filled 2022-01-06: qty 22, 22d supply, fill #0
  Filled 2022-01-06: qty 50, 62d supply, fill #0

## 2022-01-06 MED ORDER — ANAGRELIDE HCL 1 MG PO CAPS
1.0000 mg | ORAL_CAPSULE | Freq: Every day | ORAL | 1 refills | Status: DC
  Filled 2022-01-06: qty 90, 90d supply, fill #0
  Filled 2022-04-10: qty 90, 90d supply, fill #1

## 2022-01-06 NOTE — Assessment & Plan Note (Signed)
She has been missing her appointment intermittently over the last few years We discussed the importance of blood count checked every 3 months

## 2022-01-06 NOTE — Assessment & Plan Note (Signed)
She has multifactorial anemia. She has mild chronic kidney failure that is stable We will observe only for now

## 2022-01-06 NOTE — Assessment & Plan Note (Signed)
The patient only missed anagrelide for 1 week due to prescription ran out Since we increased the dose of hydroxyurea from the last visit, we have better control of her platelet count I will verify with the patient the dose of Hydrea and anagrelide today I plan to see her again in 3 months I reinforced the importance of compliance She will continue aspirin therapy to prevent risk of blood clots

## 2022-01-06 NOTE — Progress Notes (Signed)
[place] OFFICE PROGRESS NOTE  Patient Care Team: [name], MD as PCP - General (Family Medicine)  ASSESSMENT & PLAN:  Essential thrombocythemia [facility]) The patient only missed anagrelide for 1 week due to prescription ran out Since we increased the dose of hydroxyurea from the last visit, we have better control of her platelet count I will verify with the patient the dose of Hydrea and anagrelide today I plan to see her again in 3 months I reinforced the importance of compliance She will continue aspirin therapy to prevent risk of blood clots  Anemia in neoplastic disease She has multifactorial anemia. She has mild chronic kidney failure that is stable We will observe only for now  Noncompliance with medication regimen She has been missing her appointment intermittently over the last few years We discussed the importance of blood count checked every 3 months  No orders of the defined types were placed in this encounter.   All questions were answered. The patient knows to call the clinic with any problems, questions or concerns. The total time spent in the appointment was 20 minutes encounter with patients including review of chart and various tests results, discussions about plan of care and coordination of care plan   [name], MD 01/06/2022 10:30 AM  INTERVAL HISTORY: Please see below for problem oriented charting. she returns for treatment follow-up on hydroxyurea and anagrelide for essential thrombocytosis She has missed her appointment several months ago until she called for refill and now show up for appointment She stated she is compliant taking her medications but has not been taking her Hydrea correctly No recent infection Denies recent bleeding  REVIEW OF SYSTEMS:   Constitutional: Denies fevers, chills or abnormal weight loss Eyes: Denies blurriness of vision Ears, nose, mouth, throat, and face: Denies mucositis or sore throat Respiratory:  Denies cough, dyspnea or wheezes Cardiovascular: Denies palpitation, chest discomfort or lower extremity swelling Gastrointestinal:  Denies nausea, heartburn or change in bowel habits Skin: Denies abnormal skin rashes Lymphatics: Denies new lymphadenopathy or easy bruising Neurological:Denies numbness, tingling or new weaknesses Behavioral/Psych: Mood is stable, no new changes  All other systems were reviewed with the patient and are negative.  I have reviewed the past medical history, past surgical history, social history and family history with the patient and they are unchanged from previous note.  ALLERGIES:  has No Known Allergies.  MEDICATIONS:  Current Outpatient Medications  Medication Sig Dispense Refill   ALPRAZolam (XANAX) 0.5 MG tablet Take 0.25 mg by mouth daily as needed for anxiety.     anagrelide (AGRYLIN) 1 MG capsule Take 1 capsule (1 mg total) by mouth daily. 90 capsule 1   aspirin 81 MG tablet Take 1 tablet (81 mg total) by mouth daily. 30 tablet 0   diclofenac sodium (VOLTAREN) 1 % GEL Apply 2 g topically 4 (four) times daily. 1 Tube 0   diphenhydramine-acetaminophen (TYLENOL PM) 25-500 MG TABS tablet Take 1 tablet by mouth at bedtime as needed.     hydroxyurea (HYDREA) 500 MG capsule Take 1 capsule by mouth every day except on Sundays 100 capsule 1   losartan-hydrochlorothiazide (HYZAAR) 100-25 MG tablet Take 1 tablet by mouth daily.     metoprolol (LOPRESSOR) 50 MG tablet Take 50 mg by mouth 2 (two) times daily.     Multiple Vitamin (MULTIVITAMIN) tablet Take 1 tablet by mouth daily.     vitamin B-12 (CYANOCOBALAMIN) 1000 MCG tablet Take 1 tablet (1,000 mcg total) by mouth daily.  30 tablet 6   No current facility-administered medications for this visit.    SUMMARY OF ONCOLOGIC HISTORY:  I reviewed the patient's records extensive and collaborated the history with the patient. Summary of her history is as follows: This patient was originally diagnosed with  myeloproliferative disorder approximately 10 years ago. According to her, she have chronic back pain. MRI showed abnormal bone marrow signal and she was subsequently referred to see an oncologist. Bone marrow biopsy dated 12/10/2004, confirm essential thrombocytosis. The patient was started on Hydroxyurea 1000 mg daily for 5 days and 1500 mg on Tuesdays and Fridays and anagrelide 0.5 mg twice daily. She has poor compliance which she attributed to major depression. She self discontinued medication for over 6 months in 2016 to remain on aspirin to prevent risk of blood clots. She has frequent sweats which she attributed to menopausal symptoms. The patient denies any recent signs or symptoms of bleeding such as spontaneous epistaxis, hematuria or hematochezia. On 02/01/2015, her treatment is switched to 0.5 mg of anagrelide daily along with aspirin In December 2017, anagrelide is increased to 1 mg daily along with aspirin From 2017-2019, she had intermittent treatment due to loss of follow-up and noncompliance And of April, anagrelide is stopped due to national shortage and she is prescribed hydroxyurea instead 03/03/2019: Her treatment is modified by introducing hydroxyurea 3 times a week along with anagrelide 1 mg daily On 08/31/2020, her treatment is modified by changing hydroxyurea to 4 times a week along with anagrelide 1 mg daily On 02/28/2021, her treatment is modified by changing hydroxyurea to daily 500 mg from Monday to Friday and skip Saturday and Sundays and to continue anagrelide at 1 mg daily On June 07, 2021, her treatment is modified by changing hydroxyurea to daily 500 mg except skip on Sundays and to continue anagrelide at 1 mg daily   PHYSICAL EXAMINATION: ECOG PERFORMANCE STATUS: 0 - Asymptomatic  Vitals:   01/06/22 0955  BP: 139/86  Pulse: 60  Resp: 18  Temp: 97.9 F (36.6 C)  SpO2: 99%   Filed Weights   01/06/22 0955  Weight: 188 lb (85.3 kg)    GENERAL:alert, no  distress and comfortable NEURO: alert & oriented x 3 with fluent speech, no focal motor/sensory deficits  LABORATORY DATA:  I have reviewed the data as listed    Component Value Date/Time   NA 139 11/23/2018 1148   NA 141 11/02/2014 1308   K 3.8 11/23/2018 1148   K 3.8 11/02/2014 1308   CL 104 11/23/2018 1148   CL 106 10/06/2012 1002   CO2 24 11/23/2018 1148   CO2 24 11/02/2014 1308   GLUCOSE 118 (H) 11/23/2018 1148   GLUCOSE 99 11/02/2014 1308   GLUCOSE 104 (H) 10/06/2012 1002   BUN 15 11/23/2018 1148   BUN 9.8 11/02/2014 1308   CREATININE 1.12 (H) 11/23/2018 1148   CREATININE 0.9 11/02/2014 1308   CALCIUM 9.6 11/23/2018 1148   CALCIUM 9.2 11/02/2014 1308   PROT 7.7 11/23/2018 1148   PROT 7.1 11/02/2014 1308   ALBUMIN 4.3 11/23/2018 1148   ALBUMIN 4.0 11/02/2014 1308   AST 16 11/23/2018 1148   AST 23 11/02/2014 1308   ALT 14 11/23/2018 1148   ALT 22 11/02/2014 1308   ALKPHOS 81 11/23/2018 1148   ALKPHOS 118 11/02/2014 1308   BILITOT 0.5 11/23/2018 1148   BILITOT 0.34 11/02/2014 1308   GFRNONAA 56 (L) 11/23/2018 1148   GFRAA >60 11/23/2018 1148    No results  found for: "SPEP", "UPEP"  Lab Results  Component Value Date   WBC 4.6 01/06/2022   NEUTROABS 1.9 01/06/2022   HGB 11.9 (L) 01/06/2022   HCT 35.5 (L) 01/06/2022   MCV 99.4 01/06/2022   PLT 442 (H) 01/06/2022      Chemistry      Component Value Date/Time   NA 139 11/23/2018 1148   NA 141 11/02/2014 1308   K 3.8 11/23/2018 1148   K 3.8 11/02/2014 1308   CL 104 11/23/2018 1148   CL 106 10/06/2012 1002   CO2 24 11/23/2018 1148   CO2 24 11/02/2014 1308   BUN 15 11/23/2018 1148   BUN 9.8 11/02/2014 1308   CREATININE 1.12 (H) 11/23/2018 1148   CREATININE 0.9 11/02/2014 1308      Component Value Date/Time   CALCIUM 9.6 11/23/2018 1148   CALCIUM 9.2 11/02/2014 1308   ALKPHOS 81 11/23/2018 1148   ALKPHOS 118 11/02/2014 1308   AST 16 11/23/2018 1148   AST 23 11/02/2014 1308   ALT 14 11/23/2018 1148    ALT 22 11/02/2014 1308   BILITOT 0.5 11/23/2018 1148   BILITOT 0.34 11/02/2014 1308

## 2022-01-08 ENCOUNTER — Other Ambulatory Visit (HOSPITAL_COMMUNITY): Payer: Self-pay

## 2022-01-08 ENCOUNTER — Telehealth: Payer: Self-pay

## 2022-01-08 ENCOUNTER — Other Ambulatory Visit: Payer: Self-pay

## 2022-01-08 MED ORDER — HYDROXYUREA 500 MG PO CAPS: ORAL_CAPSULE | ORAL | 1 refills | Status: DC

## 2022-01-08 NOTE — Telephone Encounter (Signed)
Returned her call. She is requesting Hydrea sent to [facility] pharmacy. The Rx is cheaper at [facility]. Rx sent to her preferred pharmacy.

## 2022-01-14 ENCOUNTER — Other Ambulatory Visit (HOSPITAL_COMMUNITY): Payer: Self-pay

## 2022-04-08 ENCOUNTER — Ambulatory Visit: Payer: Federal, State, Local not specified - PPO | Admitting: Hematology and Oncology

## 2022-04-08 ENCOUNTER — Other Ambulatory Visit: Payer: Federal, State, Local not specified - PPO

## 2022-04-10 ENCOUNTER — Inpatient Hospital Stay: Payer: Federal, State, Local not specified - PPO | Attending: Hematology and Oncology

## 2022-04-10 ENCOUNTER — Encounter: Payer: Self-pay | Admitting: Hematology and Oncology

## 2022-04-10 ENCOUNTER — Other Ambulatory Visit: Payer: Self-pay

## 2022-04-10 ENCOUNTER — Other Ambulatory Visit (HOSPITAL_COMMUNITY): Payer: Self-pay

## 2022-04-10 ENCOUNTER — Inpatient Hospital Stay (HOSPITAL_BASED_OUTPATIENT_CLINIC_OR_DEPARTMENT_OTHER): Payer: Federal, State, Local not specified - PPO | Admitting: Hematology and Oncology

## 2022-04-10 VITALS — BP 118/62 | HR 54 | Temp 97.4°F | Resp 18 | Ht 70.0 in | Wt 189.4 lb

## 2022-04-10 DIAGNOSIS — D473 Essential (hemorrhagic) thrombocythemia: Secondary | ICD-10-CM | POA: Insufficient documentation

## 2022-04-10 DIAGNOSIS — Z23 Encounter for immunization: Secondary | ICD-10-CM | POA: Diagnosis not present

## 2022-04-10 DIAGNOSIS — Z7982 Long term (current) use of aspirin: Secondary | ICD-10-CM | POA: Insufficient documentation

## 2022-04-10 DIAGNOSIS — D61818 Other pancytopenia: Secondary | ICD-10-CM | POA: Diagnosis not present

## 2022-04-10 DIAGNOSIS — E559 Vitamin D deficiency, unspecified: Secondary | ICD-10-CM | POA: Diagnosis not present

## 2022-04-10 DIAGNOSIS — Z79899 Other long term (current) drug therapy: Secondary | ICD-10-CM | POA: Diagnosis not present

## 2022-04-10 DIAGNOSIS — R079 Chest pain, unspecified: Secondary | ICD-10-CM | POA: Diagnosis not present

## 2022-04-10 DIAGNOSIS — E785 Hyperlipidemia, unspecified: Secondary | ICD-10-CM | POA: Diagnosis not present

## 2022-04-10 DIAGNOSIS — F410 Panic disorder [episodic paroxysmal anxiety] without agoraphobia: Secondary | ICD-10-CM | POA: Diagnosis not present

## 2022-04-10 DIAGNOSIS — I10 Essential (primary) hypertension: Secondary | ICD-10-CM | POA: Diagnosis not present

## 2022-04-10 DIAGNOSIS — Z0000 Encounter for general adult medical examination without abnormal findings: Secondary | ICD-10-CM | POA: Diagnosis not present

## 2022-04-10 LAB — CBC WITH DIFFERENTIAL/PLATELET
Abs Immature Granulocytes: 0.01 10*3/uL (ref 0.00–0.07)
Basophils Absolute: 0.1 10*3/uL (ref 0.0–0.1)
Basophils Relative: 1 %
Eosinophils Absolute: 0 10*3/uL (ref 0.0–0.5)
Eosinophils Relative: 1 %
HCT: 36.2 % (ref 36.0–46.0)
Hemoglobin: 11.7 g/dL — ABNORMAL LOW (ref 12.0–15.0)
Immature Granulocytes: 0 %
Lymphocytes Relative: 44 %
Lymphs Abs: 1.6 10*3/uL (ref 0.7–4.0)
MCH: 33.4 pg (ref 26.0–34.0)
MCHC: 32.3 g/dL (ref 30.0–36.0)
MCV: 103.4 fL — ABNORMAL HIGH (ref 80.0–100.0)
Monocytes Absolute: 0.3 10*3/uL (ref 0.1–1.0)
Monocytes Relative: 9 %
Neutro Abs: 1.6 10*3/uL — ABNORMAL LOW (ref 1.7–7.7)
Neutrophils Relative %: 45 %
Platelets: 372 10*3/uL (ref 150–400)
RBC: 3.5 MIL/uL — ABNORMAL LOW (ref 3.87–5.11)
RDW: 15.8 % — ABNORMAL HIGH (ref 11.5–15.5)
WBC: 3.7 10*3/uL — ABNORMAL LOW (ref 4.0–10.5)
nRBC: 0 % (ref 0.0–0.2)

## 2022-04-10 NOTE — Assessment & Plan Note (Signed)
She has mild intermittent leukopenia from treatment side effects Her chronic anemia is stable She is not symptomatic Observe only 

## 2022-04-10 NOTE — Progress Notes (Signed)
[place] OFFICE PROGRESS NOTE  Patient Care Team: [name], MD as PCP - General (Family Medicine)  ASSESSMENT & PLAN:  Essential thrombocythemia [facility]) She is compliant taking medications as directed and denies missing doses I plan to see her again in 3 months I reinforced the importance of compliance She will continue aspirin therapy to prevent risk of blood clots  Pancytopenia, acquired ([place]) She has mild intermittent leukopenia from treatment side effects Her chronic anemia is stable She is not symptomatic Observe only  No orders of the defined types were placed in this encounter.   All questions were answered. The patient knows to call the clinic with any problems, questions or concerns. The total time spent in the appointment was 20 minutes encounter with patients including review of chart and various tests results, discussions about plan of care and coordination of care plan   [name], MD 04/10/2022 10:54 AM  INTERVAL HISTORY: Please see below for problem oriented charting. she returns for treatment follow-up on anagrelide and hydroxyurea for myeloproliferative disorder She is compliant taking medications as directed She denies side effects from treatment so far No recent bleeding or infection  REVIEW OF SYSTEMS:   Constitutional: Denies fevers, chills or abnormal weight loss Eyes: Denies blurriness of vision Ears, nose, mouth, throat, and face: Denies mucositis or sore throat Respiratory: Denies cough, dyspnea or wheezes Cardiovascular: Denies palpitation, chest discomfort or lower extremity swelling Gastrointestinal:  Denies nausea, heartburn or change in bowel habits Skin: Denies abnormal skin rashes Lymphatics: Denies new lymphadenopathy or easy bruising Neurological:Denies numbness, tingling or new weaknesses Behavioral/Psych: Mood is stable, no new changes  All other systems were reviewed with the patient and are negative.  I have  reviewed the past medical history, past surgical history, social history and family history with the patient and they are unchanged from previous note.  ALLERGIES:  has No Known Allergies.  MEDICATIONS:  Current Outpatient Medications  Medication Sig Dispense Refill   ALPRAZolam (XANAX) 0.5 MG tablet Take 0.25 mg by mouth daily as needed for anxiety.     anagrelide (AGRYLIN) 1 MG capsule Take 1 capsule (1 mg total) by mouth daily. 90 capsule 1   aspirin 81 MG tablet Take 1 tablet (81 mg total) by mouth daily. 30 tablet 0   diclofenac sodium (VOLTAREN) 1 % GEL Apply 2 g topically 4 (four) times daily. 1 Tube 0   diphenhydramine-acetaminophen (TYLENOL PM) 25-500 MG TABS tablet Take 1 tablet by mouth at bedtime as needed.     hydroxyurea (HYDREA) 500 MG capsule Take 1 capsule by mouth every day except on Sundays (do not take on Sundays) 100 capsule 1   losartan-hydrochlorothiazide (HYZAAR) 100-25 MG tablet Take 1 tablet by mouth daily.     metoprolol (LOPRESSOR) 50 MG tablet Take 50 mg by mouth 2 (two) times daily.     Multiple Vitamin (MULTIVITAMIN) tablet Take 1 tablet by mouth daily.     vitamin B-12 (CYANOCOBALAMIN) 1000 MCG tablet Take 1 tablet (1,000 mcg total) by mouth daily. 30 tablet 6   No current facility-administered medications for this visit.    SUMMARY OF ONCOLOGIC HISTORY:  I reviewed the patient's records extensive and collaborated the history with the patient. Summary of her history is as follows: This patient was originally diagnosed with myeloproliferative disorder approximately 10 years ago. According to her, she have chronic back pain. MRI showed abnormal bone marrow signal and she was subsequently referred to see an oncologist. Bone marrow biopsy  dated 12/10/2004, confirm essential thrombocytosis. The patient was started on Hydroxyurea 1000 mg daily for 5 days and 1500 mg on Tuesdays and Fridays and anagrelide 0.5 mg twice daily. She has poor compliance which she  attributed to major depression. She self discontinued medication for over 6 months in 2016 to remain on aspirin to prevent risk of blood clots. She has frequent sweats which she attributed to menopausal symptoms. The patient denies any recent signs or symptoms of bleeding such as spontaneous epistaxis, hematuria or hematochezia. On 02/01/2015, her treatment is switched to 0.5 mg of anagrelide daily along with aspirin In December 2017, anagrelide is increased to 1 mg daily along with aspirin From 2017-2019, she had intermittent treatment due to loss of follow-up and noncompliance And of April, anagrelide is stopped due to national shortage and she is prescribed hydroxyurea instead 03/03/2019: Her treatment is modified by introducing hydroxyurea 3 times a week along with anagrelide 1 mg daily On 08/31/2020, her treatment is modified by changing hydroxyurea to 4 times a week along with anagrelide 1 mg daily On 02/28/2021, her treatment is modified by changing hydroxyurea to daily 500 mg from Monday to Friday and skip Saturday and Sundays and to continue anagrelide at 1 mg daily On June 07, 2021, her treatment is modified by changing hydroxyurea to daily 500 mg except skip on Sundays and to continue anagrelide at 1 mg daily    PHYSICAL EXAMINATION: ECOG PERFORMANCE STATUS: 0 - Asymptomatic  Vitals:   04/10/22 0930  BP: 118/62  Pulse: (!) 54  Resp: 18  Temp: (!) 97.4 F (36.3 C)  SpO2: 100%   Filed Weights   04/10/22 0930  Weight: 189 lb 6.4 oz (85.9 kg)    GENERAL:alert, no distress and comfortable NEURO: alert & oriented x 3 with fluent speech, no focal motor/sensory deficits  LABORATORY DATA:  I have reviewed the data as listed    Component Value Date/Time   NA 139 11/23/2018 1148   NA 141 11/02/2014 1308   K 3.8 11/23/2018 1148   K 3.8 11/02/2014 1308   CL 104 11/23/2018 1148   CL 106 10/06/2012 1002   CO2 24 11/23/2018 1148   CO2 24 11/02/2014 1308   GLUCOSE 118 (H)  11/23/2018 1148   GLUCOSE 99 11/02/2014 1308   GLUCOSE 104 (H) 10/06/2012 1002   BUN 15 11/23/2018 1148   BUN 9.8 11/02/2014 1308   CREATININE 1.12 (H) 11/23/2018 1148   CREATININE 0.9 11/02/2014 1308   CALCIUM 9.6 11/23/2018 1148   CALCIUM 9.2 11/02/2014 1308   PROT 7.7 11/23/2018 1148   PROT 7.1 11/02/2014 1308   ALBUMIN 4.3 11/23/2018 1148   ALBUMIN 4.0 11/02/2014 1308   AST 16 11/23/2018 1148   AST 23 11/02/2014 1308   ALT 14 11/23/2018 1148   ALT 22 11/02/2014 1308   ALKPHOS 81 11/23/2018 1148   ALKPHOS 118 11/02/2014 1308   BILITOT 0.5 11/23/2018 1148   BILITOT 0.34 11/02/2014 1308   GFRNONAA 56 (L) 11/23/2018 1148   GFRAA >60 11/23/2018 1148    No results found for: "SPEP", "UPEP"  Lab Results  Component Value Date   WBC 3.7 (L) 04/10/2022   NEUTROABS 1.6 (L) 04/10/2022   HGB 11.7 (L) 04/10/2022   HCT 36.2 04/10/2022   MCV 103.4 (H) 04/10/2022   PLT 372 04/10/2022      Chemistry      Component Value Date/Time   NA 139 11/23/2018 1148   NA 141 11/02/2014 1308  K 3.8 11/23/2018 1148   K 3.8 11/02/2014 1308   CL 104 11/23/2018 1148   CL 106 10/06/2012 1002   CO2 24 11/23/2018 1148   CO2 24 11/02/2014 1308   BUN 15 11/23/2018 1148   BUN 9.8 11/02/2014 1308   CREATININE 1.12 (H) 11/23/2018 1148   CREATININE 0.9 11/02/2014 1308      Component Value Date/Time   CALCIUM 9.6 11/23/2018 1148   CALCIUM 9.2 11/02/2014 1308   ALKPHOS 81 11/23/2018 1148   ALKPHOS 118 11/02/2014 1308   AST 16 11/23/2018 1148   AST 23 11/02/2014 1308   ALT 14 11/23/2018 1148   ALT 22 11/02/2014 1308   BILITOT 0.5 11/23/2018 1148   BILITOT 0.34 11/02/2014 1308

## 2022-04-10 NOTE — Assessment & Plan Note (Signed)
She is compliant taking medications as directed and denies missing doses I plan to see her again in 3 months I reinforced the importance of compliance She will continue aspirin therapy to prevent risk of blood clots

## 2022-04-18 ENCOUNTER — Other Ambulatory Visit: Payer: Self-pay | Admitting: Family Medicine

## 2022-04-18 DIAGNOSIS — E2839 Other primary ovarian failure: Secondary | ICD-10-CM

## 2022-07-10 ENCOUNTER — Encounter: Payer: Self-pay | Admitting: Hematology and Oncology

## 2022-07-10 ENCOUNTER — Other Ambulatory Visit (HOSPITAL_COMMUNITY): Payer: Self-pay

## 2022-07-10 ENCOUNTER — Inpatient Hospital Stay: Payer: Federal, State, Local not specified - PPO | Attending: Hematology and Oncology

## 2022-07-10 ENCOUNTER — Inpatient Hospital Stay (HOSPITAL_BASED_OUTPATIENT_CLINIC_OR_DEPARTMENT_OTHER): Payer: Federal, State, Local not specified - PPO | Admitting: Hematology and Oncology

## 2022-07-10 VITALS — BP 111/70 | HR 60 | Temp 97.7°F | Resp 18 | Ht 70.0 in | Wt 192.0 lb

## 2022-07-10 DIAGNOSIS — D473 Essential (hemorrhagic) thrombocythemia: Secondary | ICD-10-CM | POA: Insufficient documentation

## 2022-07-10 DIAGNOSIS — Z7982 Long term (current) use of aspirin: Secondary | ICD-10-CM | POA: Diagnosis not present

## 2022-07-10 DIAGNOSIS — Z79899 Other long term (current) drug therapy: Secondary | ICD-10-CM | POA: Diagnosis not present

## 2022-07-10 DIAGNOSIS — D61818 Other pancytopenia: Secondary | ICD-10-CM | POA: Insufficient documentation

## 2022-07-10 DIAGNOSIS — Z6827 Body mass index (BMI) 27.0-27.9, adult: Secondary | ICD-10-CM | POA: Diagnosis not present

## 2022-07-10 DIAGNOSIS — Z01419 Encounter for gynecological examination (general) (routine) without abnormal findings: Secondary | ICD-10-CM | POA: Diagnosis not present

## 2022-07-10 DIAGNOSIS — Z1231 Encounter for screening mammogram for malignant neoplasm of breast: Secondary | ICD-10-CM | POA: Diagnosis not present

## 2022-07-10 LAB — CBC WITH DIFFERENTIAL/PLATELET
Abs Immature Granulocytes: 0.01 10*3/uL (ref 0.00–0.07)
Basophils Absolute: 0 10*3/uL (ref 0.0–0.1)
Basophils Relative: 1 %
Eosinophils Absolute: 0 10*3/uL (ref 0.0–0.5)
Eosinophils Relative: 1 %
HCT: 34.6 % — ABNORMAL LOW (ref 36.0–46.0)
Hemoglobin: 11.8 g/dL — ABNORMAL LOW (ref 12.0–15.0)
Immature Granulocytes: 0 %
Lymphocytes Relative: 52 %
Lymphs Abs: 2 10*3/uL (ref 0.7–4.0)
MCH: 36.2 pg — ABNORMAL HIGH (ref 26.0–34.0)
MCHC: 34.1 g/dL (ref 30.0–36.0)
MCV: 106.1 fL — ABNORMAL HIGH (ref 80.0–100.0)
Monocytes Absolute: 0.3 10*3/uL (ref 0.1–1.0)
Monocytes Relative: 7 %
Neutro Abs: 1.5 10*3/uL — ABNORMAL LOW (ref 1.7–7.7)
Neutrophils Relative %: 39 %
Platelets: 436 10*3/uL — ABNORMAL HIGH (ref 150–400)
RBC: 3.26 MIL/uL — ABNORMAL LOW (ref 3.87–5.11)
RDW: 14.6 % (ref 11.5–15.5)
WBC: 3.8 10*3/uL — ABNORMAL LOW (ref 4.0–10.5)
nRBC: 0 % (ref 0.0–0.2)

## 2022-07-10 MED ORDER — ANAGRELIDE HCL 1 MG PO CAPS
1.0000 mg | ORAL_CAPSULE | Freq: Every day | ORAL | 1 refills | Status: DC
  Filled 2022-07-10: qty 90, 90d supply, fill #0
  Filled 2022-07-30: qty 30, 30d supply, fill #0
  Filled 2022-07-30: qty 90, 90d supply, fill #0
  Filled 2022-11-10: qty 90, 90d supply, fill #1

## 2022-07-10 NOTE — Progress Notes (Signed)
[place] OFFICE PROGRESS NOTE  Patient Care Team: [name], MD as PCP - General (Family Medicine)  ASSESSMENT & PLAN:  Essential thrombocythemia [facility]) She is compliant taking medications as directed and denies missing doses I plan to see her again in 4 months I reinforced the importance of compliance She will continue aspirin therapy to prevent risk of blood clots  Pancytopenia, acquired ([place]) She has mild intermittent leukopenia from treatment side effects Her chronic anemia is stable She is not symptomatic Observe only  No orders of the defined types were placed in this encounter.   All questions were answered. The patient knows to call the clinic with any problems, questions or concerns. The total time spent in the appointment was 20 minutes encounter with patients including review of chart and various tests results, discussions about plan of care and coordination of care plan   [name], MD 07/10/2022 10:18 AM  INTERVAL HISTORY: Please see below for problem oriented charting. she returns for treatment follow-up on hydroxyurea and anagrelide She is compliant taking her medication as directed She denies missing doses No recent infection REVIEW OF SYSTEMS:   Constitutional: Denies fevers, chills or abnormal weight loss Eyes: Denies blurriness of vision Ears, nose, mouth, throat, and face: Denies mucositis or sore throat Respiratory: Denies cough, dyspnea or wheezes Cardiovascular: Denies palpitation, chest discomfort or lower extremity swelling Gastrointestinal:  Denies nausea, heartburn or change in bowel habits Skin: Denies abnormal skin rashes Lymphatics: Denies new lymphadenopathy or easy bruising Neurological:Denies numbness, tingling or new weaknesses Behavioral/Psych: Mood is stable, no new changes  All other systems were reviewed with the patient and are negative.  I have reviewed the past medical history, past surgical history, social  history and family history with the patient and they are unchanged from previous note.  ALLERGIES:  has No Known Allergies.  MEDICATIONS:  Current Outpatient Medications  Medication Sig Dispense Refill   ALPRAZolam (XANAX) 0.5 MG tablet Take 0.25 mg by mouth daily as needed for anxiety.     anagrelide (AGRYLIN) 1 MG capsule Take 1 capsule (1 mg total) by mouth daily. 90 capsule 1   aspirin 81 MG tablet Take 1 tablet (81 mg total) by mouth daily. 30 tablet 0   diclofenac sodium (VOLTAREN) 1 % GEL Apply 2 g topically 4 (four) times daily. 1 Tube 0   diphenhydramine-acetaminophen (TYLENOL PM) 25-500 MG TABS tablet Take 1 tablet by mouth at bedtime as needed.     hydroxyurea (HYDREA) 500 MG capsule Take 1 capsule by mouth every day except on Sundays (do not take on Sundays) 100 capsule 1   losartan-hydrochlorothiazide (HYZAAR) 100-25 MG tablet Take 1 tablet by mouth daily.     metoprolol (LOPRESSOR) 50 MG tablet Take 50 mg by mouth 2 (two) times daily.     Multiple Vitamin (MULTIVITAMIN) tablet Take 1 tablet by mouth daily.     vitamin B-12 (CYANOCOBALAMIN) 1000 MCG tablet Take 1 tablet (1,000 mcg total) by mouth daily. 30 tablet 6   No current facility-administered medications for this visit.    SUMMARY OF ONCOLOGIC HISTORY: Oncology History  Essential thrombocythemia ([place])  12/10/2004 Miscellaneous   This patient was originally diagnosed with myeloproliferative disorder. According to her, she have chronic back pain. MRI showed abnormal bone marrow signal and she was subsequently referred to see an oncologist. Bone marrow biopsy dated 12/10/2004, confirm essential thrombocytosis. The patient was started on Hydroxyurea 1000 mg daily for 5 days and 1500 mg on Tuesdays and  Fridays and anagrelide 0.5 mg twice daily. She has poor compliance which she attributed to major depression. She self discontinued medication for over 6 months in 2016 to remain on aspirin to prevent risk of blood clots.    02/01/2015 -  Chemotherapy   On 02/01/2015, her treatment is switched to 0.5 mg of anagrelide daily along with aspirin In December 2017, anagrelide is increased to 1 mg daily along with aspirin From 2017-2019, she had intermittent treatment due to loss of follow-up and noncompliance And of April, anagrelide is stopped due to national shortage and she is prescribed hydroxyurea instead 03/03/2019: Her treatment is modified by introducing hydroxyurea 3 times a week along with anagrelide 1 mg daily On 08/31/2020, her treatment is modified by changing hydroxyurea to 4 times a week along with anagrelide 1 mg daily On 02/28/2021, her treatment is modified by changing hydroxyurea to daily 500 mg from Monday to Friday and skip Saturday and Sundays and to continue anagrelide at 1 mg daily On June 07, 2021, her treatment is modified by changing hydroxyurea to daily 500 mg except skip on Sundays and to continue anagrelide at 1 mg daily        PHYSICAL EXAMINATION: ECOG PERFORMANCE STATUS: 0 - Asymptomatic  Vitals:   07/10/22 1009  BP: 111/70  Pulse: 60  Resp: 18  Temp: 97.7 F (36.5 C)  SpO2: 100%   Filed Weights   07/10/22 1009  Weight: 192 lb (87.1 kg)    GENERAL:alert, no distress and comfortable  NEURO: alert & oriented x 3 with fluent speech, no focal motor/sensory deficits  LABORATORY DATA:  I have reviewed the data as listed    Component Value Date/Time   NA 139 11/23/2018 1148   NA 141 11/02/2014 1308   K 3.8 11/23/2018 1148   K 3.8 11/02/2014 1308   CL 104 11/23/2018 1148   CL 106 10/06/2012 1002   CO2 24 11/23/2018 1148   CO2 24 11/02/2014 1308   GLUCOSE 118 (H) 11/23/2018 1148   GLUCOSE 99 11/02/2014 1308   GLUCOSE 104 (H) 10/06/2012 1002   BUN 15 11/23/2018 1148   BUN 9.8 11/02/2014 1308   CREATININE 1.12 (H) 11/23/2018 1148   CREATININE 0.9 11/02/2014 1308   CALCIUM 9.6 11/23/2018 1148   CALCIUM 9.2 11/02/2014 1308   PROT 7.7 11/23/2018 1148   PROT 7.1  11/02/2014 1308   ALBUMIN 4.3 11/23/2018 1148   ALBUMIN 4.0 11/02/2014 1308   AST 16 11/23/2018 1148   AST 23 11/02/2014 1308   ALT 14 11/23/2018 1148   ALT 22 11/02/2014 1308   ALKPHOS 81 11/23/2018 1148   ALKPHOS 118 11/02/2014 1308   BILITOT 0.5 11/23/2018 1148   BILITOT 0.34 11/02/2014 1308   GFRNONAA 56 (L) 11/23/2018 1148   GFRAA >60 11/23/2018 1148    No results found for: "SPEP", "UPEP"  Lab Results  Component Value Date   WBC 3.8 (L) 07/10/2022   NEUTROABS 1.5 (L) 07/10/2022   HGB 11.8 (L) 07/10/2022   HCT 34.6 (L) 07/10/2022   MCV 106.1 (H) 07/10/2022   PLT 436 (H) 07/10/2022      Chemistry      Component Value Date/Time   NA 139 11/23/2018 1148   NA 141 11/02/2014 1308   K 3.8 11/23/2018 1148   K 3.8 11/02/2014 1308   CL 104 11/23/2018 1148   CL 106 10/06/2012 1002   CO2 24 11/23/2018 1148   CO2 24 11/02/2014 1308   BUN 15 11/23/2018  1148   BUN 9.8 11/02/2014 1308   CREATININE 1.12 (H) 11/23/2018 1148   CREATININE 0.9 11/02/2014 1308      Component Value Date/Time   CALCIUM 9.6 11/23/2018 1148   CALCIUM 9.2 11/02/2014 1308   ALKPHOS 81 11/23/2018 1148   ALKPHOS 118 11/02/2014 1308   AST 16 11/23/2018 1148   AST 23 11/02/2014 1308   ALT 14 11/23/2018 1148   ALT 22 11/02/2014 1308   BILITOT 0.5 11/23/2018 1148   BILITOT 0.34 11/02/2014 1308

## 2022-07-10 NOTE — Assessment & Plan Note (Signed)
She is compliant taking medications as directed and denies missing doses I plan to see her again in 4 months I reinforced the importance of compliance She will continue aspirin therapy to prevent risk of blood clots

## 2022-07-10 NOTE — Assessment & Plan Note (Signed)
She has mild intermittent leukopenia from treatment side effects Her chronic anemia is stable She is not symptomatic Observe only

## 2022-07-22 ENCOUNTER — Other Ambulatory Visit (HOSPITAL_COMMUNITY): Payer: Self-pay

## 2022-07-30 ENCOUNTER — Other Ambulatory Visit: Payer: Self-pay

## 2022-07-30 ENCOUNTER — Other Ambulatory Visit (HOSPITAL_COMMUNITY): Payer: Self-pay

## 2022-07-31 ENCOUNTER — Other Ambulatory Visit: Payer: Self-pay

## 2022-07-31 ENCOUNTER — Other Ambulatory Visit (HOSPITAL_COMMUNITY): Payer: Self-pay

## 2022-09-02 ENCOUNTER — Other Ambulatory Visit: Payer: Self-pay | Admitting: Hematology and Oncology

## 2022-11-11 ENCOUNTER — Inpatient Hospital Stay: Payer: Federal, State, Local not specified - PPO | Attending: Hematology and Oncology

## 2022-11-11 ENCOUNTER — Other Ambulatory Visit: Payer: Self-pay | Admitting: Hematology and Oncology

## 2022-11-11 ENCOUNTER — Other Ambulatory Visit: Payer: Self-pay

## 2022-11-11 ENCOUNTER — Inpatient Hospital Stay (HOSPITAL_BASED_OUTPATIENT_CLINIC_OR_DEPARTMENT_OTHER): Payer: Federal, State, Local not specified - PPO | Admitting: Hematology and Oncology

## 2022-11-11 ENCOUNTER — Other Ambulatory Visit (HOSPITAL_COMMUNITY): Payer: Self-pay

## 2022-11-11 ENCOUNTER — Encounter: Payer: Self-pay | Admitting: Hematology and Oncology

## 2022-11-11 VITALS — BP 110/59 | HR 56 | Temp 97.4°F | Resp 18 | Ht 70.0 in | Wt 195.6 lb

## 2022-11-11 DIAGNOSIS — D473 Essential (hemorrhagic) thrombocythemia: Secondary | ICD-10-CM

## 2022-11-11 DIAGNOSIS — Z79899 Other long term (current) drug therapy: Secondary | ICD-10-CM | POA: Diagnosis not present

## 2022-11-11 DIAGNOSIS — Z7982 Long term (current) use of aspirin: Secondary | ICD-10-CM | POA: Diagnosis not present

## 2022-11-11 LAB — CBC WITH DIFFERENTIAL/PLATELET
Abs Immature Granulocytes: 0.01 10*3/uL (ref 0.00–0.07)
Basophils Absolute: 0 10*3/uL (ref 0.0–0.1)
Basophils Relative: 1 %
Eosinophils Absolute: 0 10*3/uL (ref 0.0–0.5)
Eosinophils Relative: 0 %
HCT: 34.5 % — ABNORMAL LOW (ref 36.0–46.0)
Hemoglobin: 11.7 g/dL — ABNORMAL LOW (ref 12.0–15.0)
Immature Granulocytes: 0 %
Lymphocytes Relative: 52 %
Lymphs Abs: 1.6 10*3/uL (ref 0.7–4.0)
MCH: 36.1 pg — ABNORMAL HIGH (ref 26.0–34.0)
MCHC: 33.9 g/dL (ref 30.0–36.0)
MCV: 106.5 fL — ABNORMAL HIGH (ref 80.0–100.0)
Monocytes Absolute: 0.3 10*3/uL (ref 0.1–1.0)
Monocytes Relative: 9 %
Neutro Abs: 1.2 10*3/uL — ABNORMAL LOW (ref 1.7–7.7)
Neutrophils Relative %: 38 %
Platelets: 364 10*3/uL (ref 150–400)
RBC: 3.24 MIL/uL — ABNORMAL LOW (ref 3.87–5.11)
RDW: 14.1 % (ref 11.5–15.5)
WBC: 3.1 10*3/uL — ABNORMAL LOW (ref 4.0–10.5)
nRBC: 0 % (ref 0.0–0.2)

## 2022-11-11 MED ORDER — ANAGRELIDE HCL 1 MG PO CAPS
1.0000 mg | ORAL_CAPSULE | Freq: Every day | ORAL | 1 refills | Status: DC
  Filled 2022-11-11 – 2022-11-13 (×2): qty 90, 90d supply, fill #0

## 2022-11-11 MED ORDER — ANAGRELIDE HCL 1 MG PO CAPS: 1.0000 mg | ORAL_CAPSULE | Freq: Every day | ORAL | 1 refills | Status: DC

## 2022-11-11 MED ORDER — HYDROXYUREA 500 MG PO CAPS
ORAL_CAPSULE | ORAL | 1 refills | Status: DC
  Filled 2022-11-11: qty 72, 84d supply, fill #0

## 2022-11-11 NOTE — Progress Notes (Signed)
[facility] OFFICE PROGRESS NOTE  Patient Care Team: Shirlean Mylar, MD as PCP - General (Family Medicine)  ASSESSMENT & PLAN:  Essential thrombocythemia [facility]) She is compliant taking medications as directed and denies missing doses She has mild persistent pancytopenia but overall tolerated treatment very well and she is not symptomatic I plan to see her again in 4 months I reinforced the importance of compliance She will continue aspirin therapy to prevent risk of blood clots  No orders of the defined types were placed in this encounter.   All questions were answered. The patient knows to call the clinic with any problems, questions or concerns. The total time spent in the appointment was 20 minutes encounter with patients including review of chart and various tests results, discussions about plan of care and coordination of care plan   Artis Delay, MD 11/11/2022 10:47 AM  INTERVAL HISTORY: Please see below for problem oriented charting. she returns for treatment follow-up on combination of anagrelide and hydroxyurea for myeloproliferative disorder/essential thrombocythemia She tolerated recent treatment well No recent infection She is compliant taking her medications as directed Denies missing doses  REVIEW OF SYSTEMS:   Constitutional: Denies fevers, chills or abnormal weight loss Eyes: Denies blurriness of vision Ears, nose, mouth, throat, and face: Denies mucositis or sore throat Respiratory: Denies cough, dyspnea or wheezes Cardiovascular: Denies palpitation, chest discomfort or lower extremity swelling Gastrointestinal:  Denies nausea, heartburn or change in bowel habits Skin: Denies abnormal skin rashes Lymphatics: Denies new lymphadenopathy or easy bruising Neurological:Denies numbness, tingling or new weaknesses Behavioral/Psych: Mood is stable, no new changes  All other systems were reviewed with the patient and are negative.  I have reviewed the past  medical history, past surgical history, social history and family history with the patient and they are unchanged from previous note.  ALLERGIES:  has No Known Allergies.  MEDICATIONS:  Current Outpatient Medications  Medication Sig Dispense Refill   ALPRAZolam (XANAX) 0.5 MG tablet Take 0.25 mg by mouth daily as needed for anxiety.     anagrelide (AGRYLIN) 1 MG capsule Take 1 capsule (1 mg total) by mouth daily. 90 capsule 1   aspirin 81 MG tablet Take 1 tablet (81 mg total) by mouth daily. 30 tablet 0   diclofenac sodium (VOLTAREN) 1 % GEL Apply 2 g topically 4 (four) times daily. 1 Tube 0   diphenhydramine-acetaminophen (TYLENOL PM) 25-500 MG TABS tablet Take 1 tablet by mouth at bedtime as needed.     hydroxyurea (HYDREA) 500 MG capsule TAKE 1 CAPSULE BY MOUTH EVERY DAY. EXCEPT ON SUNDAYS. DO NOT TAKE ON SUNDAYS 100 capsule 1   losartan-hydrochlorothiazide (HYZAAR) 100-25 MG tablet Take 1 tablet by mouth daily.     metoprolol (LOPRESSOR) 50 MG tablet Take 50 mg by mouth 2 (two) times daily.     Multiple Vitamin (MULTIVITAMIN) tablet Take 1 tablet by mouth daily.     vitamin B-12 (CYANOCOBALAMIN) 1000 MCG tablet Take 1 tablet (1,000 mcg total) by mouth daily. 30 tablet 6   No current facility-administered medications for this visit.    SUMMARY OF ONCOLOGIC HISTORY: Oncology History  Essential thrombocythemia  12/10/2004 Miscellaneous   This patient was originally diagnosed with myeloproliferative disorder. According to her, she have chronic back pain. MRI showed abnormal bone marrow signal and she was subsequently referred to see an oncologist. Bone marrow biopsy dated 12/10/2004, confirm essential thrombocytosis. The patient was started on Hydroxyurea 1000 mg daily for 5 days and 1500 mg on  Tuesdays and Fridays and anagrelide 0.5 mg twice daily. She has poor compliance which she attributed to major depression. She self discontinued medication for over 6 months in 2016 to remain on  aspirin to prevent risk of blood clots.   02/01/2015 -  Chemotherapy   On 02/01/2015, her treatment is switched to 0.5 mg of anagrelide daily along with aspirin In December 2017, anagrelide is increased to 1 mg daily along with aspirin From 2017-2019, she had intermittent treatment due to loss of follow-up and noncompliance And of April, anagrelide is stopped due to national shortage and she is prescribed hydroxyurea instead 03/03/2019: Her treatment is modified by introducing hydroxyurea 3 times a week along with anagrelide 1 mg daily On 08/31/2020, her treatment is modified by changing hydroxyurea to 4 times a week along with anagrelide 1 mg daily On 02/28/2021, her treatment is modified by changing hydroxyurea to daily 500 mg from Monday to Friday and skip Saturday and Sundays and to continue anagrelide at 1 mg daily On June 07, 2021, her treatment is modified by changing hydroxyurea to daily 500 mg except skip on Sundays and to continue anagrelide at 1 mg daily        PHYSICAL EXAMINATION: ECOG PERFORMANCE STATUS: 0 - Asymptomatic  Vitals:   11/11/22 1016  BP: (!) 110/59  Pulse: (!) 56  Resp: 18  Temp: (!) 97.4 F (36.3 C)  SpO2: 100%   Filed Weights   11/11/22 1016  Weight: 195 lb 9.6 oz (88.7 kg)    GENERAL:alert, no distress and comfortable  NEURO: alert & oriented x 3 with fluent speech, no focal motor/sensory deficits  LABORATORY DATA:  I have reviewed the data as listed    Component Value Date/Time   NA 139 11/23/2018 1148   NA 141 11/02/2014 1308   K 3.8 11/23/2018 1148   K 3.8 11/02/2014 1308   CL 104 11/23/2018 1148   CL 106 10/06/2012 1002   CO2 24 11/23/2018 1148   CO2 24 11/02/2014 1308   GLUCOSE 118 (H) 11/23/2018 1148   GLUCOSE 99 11/02/2014 1308   GLUCOSE 104 (H) 10/06/2012 1002   BUN 15 11/23/2018 1148   BUN 9.8 11/02/2014 1308   CREATININE 1.12 (H) 11/23/2018 1148   CREATININE 0.9 11/02/2014 1308   CALCIUM 9.6 11/23/2018 1148   CALCIUM 9.2  11/02/2014 1308   PROT 7.7 11/23/2018 1148   PROT 7.1 11/02/2014 1308   ALBUMIN 4.3 11/23/2018 1148   ALBUMIN 4.0 11/02/2014 1308   AST 16 11/23/2018 1148   AST 23 11/02/2014 1308   ALT 14 11/23/2018 1148   ALT 22 11/02/2014 1308   ALKPHOS 81 11/23/2018 1148   ALKPHOS 118 11/02/2014 1308   BILITOT 0.5 11/23/2018 1148   BILITOT 0.34 11/02/2014 1308   GFRNONAA 56 (L) 11/23/2018 1148   GFRAA >60 11/23/2018 1148    No results found for: "SPEP", "UPEP"  Lab Results  Component Value Date   WBC 3.1 (L) 11/11/2022   NEUTROABS 1.2 (L) 11/11/2022   HGB 11.7 (L) 11/11/2022   HCT 34.5 (L) 11/11/2022   MCV 106.5 (H) 11/11/2022   PLT 364 11/11/2022      Chemistry      Component Value Date/Time   NA 139 11/23/2018 1148   NA 141 11/02/2014 1308   K 3.8 11/23/2018 1148   K 3.8 11/02/2014 1308   CL 104 11/23/2018 1148   CL 106 10/06/2012 1002   CO2 24 11/23/2018 1148   CO2 24 11/02/2014  1308   BUN 15 11/23/2018 1148   BUN 9.8 11/02/2014 1308   CREATININE 1.12 (H) 11/23/2018 1148   CREATININE 0.9 11/02/2014 1308      Component Value Date/Time   CALCIUM 9.6 11/23/2018 1148   CALCIUM 9.2 11/02/2014 1308   ALKPHOS 81 11/23/2018 1148   ALKPHOS 118 11/02/2014 1308   AST 16 11/23/2018 1148   AST 23 11/02/2014 1308   ALT 14 11/23/2018 1148   ALT 22 11/02/2014 1308   BILITOT 0.5 11/23/2018 1148   BILITOT 0.34 11/02/2014 1308

## 2022-11-11 NOTE — Assessment & Plan Note (Signed)
She is compliant taking medications as directed and denies missing doses She has mild persistent pancytopenia but overall tolerated treatment very well and she is not symptomatic I plan to see her again in 4 months I reinforced the importance of compliance She will continue aspirin therapy to prevent risk of blood clots

## 2022-11-13 ENCOUNTER — Other Ambulatory Visit (HOSPITAL_COMMUNITY): Payer: Self-pay

## 2023-03-12 ENCOUNTER — Inpatient Hospital Stay: Payer: Federal, State, Local not specified - PPO | Attending: Hematology and Oncology

## 2023-03-12 ENCOUNTER — Inpatient Hospital Stay: Payer: Federal, State, Local not specified - PPO | Admitting: Hematology and Oncology

## 2023-03-12 ENCOUNTER — Encounter: Payer: Self-pay | Admitting: Hematology and Oncology

## 2023-04-15 DIAGNOSIS — E559 Vitamin D deficiency, unspecified: Secondary | ICD-10-CM | POA: Diagnosis not present

## 2023-04-15 DIAGNOSIS — Z23 Encounter for immunization: Secondary | ICD-10-CM | POA: Diagnosis not present

## 2023-04-15 DIAGNOSIS — I10 Essential (primary) hypertension: Secondary | ICD-10-CM | POA: Diagnosis not present

## 2023-04-15 DIAGNOSIS — E785 Hyperlipidemia, unspecified: Secondary | ICD-10-CM | POA: Diagnosis not present

## 2023-04-15 DIAGNOSIS — E538 Deficiency of other specified B group vitamins: Secondary | ICD-10-CM | POA: Diagnosis not present

## 2023-04-15 DIAGNOSIS — R42 Dizziness and giddiness: Secondary | ICD-10-CM | POA: Diagnosis not present

## 2023-04-15 DIAGNOSIS — N1831 Chronic kidney disease, stage 3a: Secondary | ICD-10-CM | POA: Diagnosis not present

## 2023-04-15 DIAGNOSIS — Z0000 Encounter for general adult medical examination without abnormal findings: Secondary | ICD-10-CM | POA: Diagnosis not present

## 2023-04-16 ENCOUNTER — Inpatient Hospital Stay
Payer: Federal, State, Local not specified - PPO | Attending: Hematology and Oncology | Admitting: Hematology and Oncology

## 2023-04-16 ENCOUNTER — Inpatient Hospital Stay: Payer: Federal, State, Local not specified - PPO

## 2023-04-16 ENCOUNTER — Encounter: Payer: Self-pay | Admitting: Hematology and Oncology

## 2023-04-16 VITALS — BP 119/75 | HR 57 | Temp 98.1°F | Resp 18 | Ht 70.0 in | Wt 193.8 lb

## 2023-04-16 DIAGNOSIS — Z7982 Long term (current) use of aspirin: Secondary | ICD-10-CM | POA: Diagnosis not present

## 2023-04-16 DIAGNOSIS — D61818 Other pancytopenia: Secondary | ICD-10-CM | POA: Diagnosis not present

## 2023-04-16 DIAGNOSIS — D473 Essential (hemorrhagic) thrombocythemia: Secondary | ICD-10-CM | POA: Diagnosis not present

## 2023-04-16 DIAGNOSIS — Z79899 Other long term (current) drug therapy: Secondary | ICD-10-CM | POA: Insufficient documentation

## 2023-04-16 LAB — CBC WITH DIFFERENTIAL/PLATELET
Abs Immature Granulocytes: 0.02 10*3/uL (ref 0.00–0.07)
Basophils Absolute: 0 10*3/uL (ref 0.0–0.1)
Basophils Relative: 1 %
Eosinophils Absolute: 0 10*3/uL (ref 0.0–0.5)
Eosinophils Relative: 0 %
HCT: 35 % — ABNORMAL LOW (ref 36.0–46.0)
Hemoglobin: 11.6 g/dL — ABNORMAL LOW (ref 12.0–15.0)
Immature Granulocytes: 1 %
Lymphocytes Relative: 41 %
Lymphs Abs: 1.5 10*3/uL (ref 0.7–4.0)
MCH: 35.2 pg — ABNORMAL HIGH (ref 26.0–34.0)
MCHC: 33.1 g/dL (ref 30.0–36.0)
MCV: 106.1 fL — ABNORMAL HIGH (ref 80.0–100.0)
Monocytes Absolute: 0.4 10*3/uL (ref 0.1–1.0)
Monocytes Relative: 10 %
Neutro Abs: 1.8 10*3/uL (ref 1.7–7.7)
Neutrophils Relative %: 47 %
Platelets: 377 10*3/uL (ref 150–400)
RBC: 3.3 MIL/uL — ABNORMAL LOW (ref 3.87–5.11)
RDW: 13.5 % (ref 11.5–15.5)
WBC: 3.7 10*3/uL — ABNORMAL LOW (ref 4.0–10.5)
nRBC: 0 % (ref 0.0–0.2)

## 2023-04-16 MED ORDER — ANAGRELIDE HCL 1 MG PO CAPS: 1.0000 mg | ORAL_CAPSULE | Freq: Every day | ORAL | 6 refills | Status: DC

## 2023-04-16 MED ORDER — HYDROXYUREA 500 MG PO CAPS: ORAL_CAPSULE | ORAL | 4 refills | Status: DC

## 2023-04-16 NOTE — Assessment & Plan Note (Signed)
She is compliant taking medications as directed  Due to her recent missed appointment, she had run out of her prescription of anagrelide Thankfully, her counts are still great She has mild persistent pancytopenia but overall tolerated treatment very well and she is not symptomatic I plan to see her again in 4 months I reinforced the importance of compliance She will continue aspirin therapy to prevent risk of blood clots

## 2023-04-16 NOTE — Progress Notes (Signed)
[facility] OFFICE PROGRESS NOTE  Patient Care Team: Shirlean Mylar, MD as PCP - General (Family Medicine)  ASSESSMENT & PLAN:  Essential thrombocythemia [facility]) She is compliant taking medications as directed  Due to her recent missed appointment, she had run out of her prescription of anagrelide Thankfully, her counts are still great She has mild persistent pancytopenia but overall tolerated treatment very well and she is not symptomatic I plan to see her again in 4 months I reinforced the importance of compliance She will continue aspirin therapy to prevent risk of blood clots  No orders of the defined types were placed in this encounter.   All questions were answered. The patient knows to call the clinic with any problems, questions or concerns. The total time spent in the appointment was 20 minutes encounter with patients including review of chart and various tests results, discussions about plan of care and coordination of care plan   Artis Delay, MD 04/16/2023 10:45 AM  INTERVAL HISTORY: Please see below for problem oriented charting. she returns for treatment follow-up She is currently taking hydroxyurea and anagrelide She missed her appointment a month ago due to sickness She ran out of anagrelide 2 weeks ago Prior to that, she is compliant taking all her medications as directed  REVIEW OF SYSTEMS:   Constitutional: Denies fevers, chills or abnormal weight loss Eyes: Denies blurriness of vision Ears, nose, mouth, throat, and face: Denies mucositis or sore throat Respiratory: Denies cough, dyspnea or wheezes Cardiovascular: Denies palpitation, chest discomfort or lower extremity swelling Gastrointestinal:  Denies nausea, heartburn or change in bowel habits Skin: Denies abnormal skin rashes Lymphatics: Denies new lymphadenopathy or easy bruising Neurological:Denies numbness, tingling or new weaknesses Behavioral/Psych: Mood is stable, no new changes  All  other systems were reviewed with the patient and are negative.  I have reviewed the past medical history, past surgical history, social history and family history with the patient and they are unchanged from previous note.  ALLERGIES:  has No Known Allergies.  MEDICATIONS:  Current Outpatient Medications  Medication Sig Dispense Refill   ALPRAZolam (XANAX) 0.5 MG tablet Take 0.25 mg by mouth daily as needed for anxiety.     anagrelide (AGRYLIN) 1 MG capsule Take 1 capsule (1 mg total) by mouth daily. 30 capsule 6   aspirin 81 MG tablet Take 1 tablet (81 mg total) by mouth daily. 30 tablet 0   diclofenac sodium (VOLTAREN) 1 % GEL Apply 2 g topically 4 (four) times daily. 1 Tube 0   diphenhydramine-acetaminophen (TYLENOL PM) 25-500 MG TABS tablet Take 1 tablet by mouth at bedtime as needed.     hydroxyurea (HYDREA) 500 MG capsule TAKE 1 CAPSULE BY MOUTH EVERY DAY. EXCEPT ON SUNDAYS. DO NOT TAKE ON SUNDAYS 30 capsule 4   losartan-hydrochlorothiazide (HYZAAR) 100-25 MG tablet Take 1 tablet by mouth daily.     metoprolol (LOPRESSOR) 50 MG tablet Take 50 mg by mouth 2 (two) times daily.     Multiple Vitamin (MULTIVITAMIN) tablet Take 1 tablet by mouth daily.     vitamin B-12 (CYANOCOBALAMIN) 1000 MCG tablet Take 1 tablet (1,000 mcg total) by mouth daily. 30 tablet 6   No current facility-administered medications for this visit.    SUMMARY OF ONCOLOGIC HISTORY: Oncology History  Essential thrombocythemia (HCC)  12/10/2004 Miscellaneous   This patient was originally diagnosed with myeloproliferative disorder. According to her, she have chronic back pain. MRI showed abnormal bone marrow signal and she was subsequently referred to  see an oncologist. Bone marrow biopsy dated 12/10/2004, confirm essential thrombocytosis. The patient was started on Hydroxyurea 1000 mg daily for 5 days and 1500 mg on Tuesdays and Fridays and anagrelide 0.5 mg twice daily. She has poor compliance which she attributed to  major depression. She self discontinued medication for over 6 months in 2016 to remain on aspirin to prevent risk of blood clots.   02/01/2015 -  Chemotherapy   On 02/01/2015, her treatment is switched to 0.5 mg of anagrelide daily along with aspirin In December 2017, anagrelide is increased to 1 mg daily along with aspirin From 2017-2019, she had intermittent treatment due to loss of follow-up and noncompliance And of April, anagrelide is stopped due to national shortage and she is prescribed hydroxyurea instead 03/03/2019: Her treatment is modified by introducing hydroxyurea 3 times a week along with anagrelide 1 mg daily On 08/31/2020, her treatment is modified by changing hydroxyurea to 4 times a week along with anagrelide 1 mg daily On 02/28/2021, her treatment is modified by changing hydroxyurea to daily 500 mg from Monday to Friday and skip Saturday and Sundays and to continue anagrelide at 1 mg daily On June 07, 2021, her treatment is modified by changing hydroxyurea to daily 500 mg except skip on Sundays and to continue anagrelide at 1 mg daily        PHYSICAL EXAMINATION: ECOG PERFORMANCE STATUS: 0 - Asymptomatic  Vitals:   04/16/23 1010  BP: 119/75  Pulse: (!) 57  Resp: 18  Temp: 98.1 F (36.7 C)  SpO2: 100%   Filed Weights   04/16/23 1010  Weight: 193 lb 12.8 oz (87.9 kg)    GENERAL:alert, no distress and comfortable   LABORATORY DATA:  I have reviewed the data as listed    Component Value Date/Time   NA 139 11/23/2018 1148   NA 141 11/02/2014 1308   K 3.8 11/23/2018 1148   K 3.8 11/02/2014 1308   CL 104 11/23/2018 1148   CL 106 10/06/2012 1002   CO2 24 11/23/2018 1148   CO2 24 11/02/2014 1308   GLUCOSE 118 (H) 11/23/2018 1148   GLUCOSE 99 11/02/2014 1308   GLUCOSE 104 (H) 10/06/2012 1002   BUN 15 11/23/2018 1148   BUN 9.8 11/02/2014 1308   CREATININE 1.12 (H) 11/23/2018 1148   CREATININE 0.9 11/02/2014 1308   CALCIUM 9.6 11/23/2018 1148   CALCIUM 9.2  11/02/2014 1308   PROT 7.7 11/23/2018 1148   PROT 7.1 11/02/2014 1308   ALBUMIN 4.3 11/23/2018 1148   ALBUMIN 4.0 11/02/2014 1308   AST 16 11/23/2018 1148   AST 23 11/02/2014 1308   ALT 14 11/23/2018 1148   ALT 22 11/02/2014 1308   ALKPHOS 81 11/23/2018 1148   ALKPHOS 118 11/02/2014 1308   BILITOT 0.5 11/23/2018 1148   BILITOT 0.34 11/02/2014 1308   GFRNONAA 56 (L) 11/23/2018 1148   GFRAA >60 11/23/2018 1148    No results found for: "SPEP", "UPEP"  Lab Results  Component Value Date   WBC 3.7 (L) 04/16/2023   NEUTROABS 1.8 04/16/2023   HGB 11.6 (L) 04/16/2023   HCT 35.0 (L) 04/16/2023   MCV 106.1 (H) 04/16/2023   PLT 377 04/16/2023      Chemistry      Component Value Date/Time   NA 139 11/23/2018 1148   NA 141 11/02/2014 1308   K 3.8 11/23/2018 1148   K 3.8 11/02/2014 1308   CL 104 11/23/2018 1148   CL 106 10/06/2012 1002  CO2 24 11/23/2018 1148   CO2 24 11/02/2014 1308   BUN 15 11/23/2018 1148   BUN 9.8 11/02/2014 1308   CREATININE 1.12 (H) 11/23/2018 1148   CREATININE 0.9 11/02/2014 1308      Component Value Date/Time   CALCIUM 9.6 11/23/2018 1148   CALCIUM 9.2 11/02/2014 1308   ALKPHOS 81 11/23/2018 1148   ALKPHOS 118 11/02/2014 1308   AST 16 11/23/2018 1148   AST 23 11/02/2014 1308   ALT 14 11/23/2018 1148   ALT 22 11/02/2014 1308   BILITOT 0.5 11/23/2018 1148   BILITOT 0.34 11/02/2014 1308

## 2023-08-11 ENCOUNTER — Inpatient Hospital Stay: Payer: BC Managed Care – PPO

## 2023-08-11 ENCOUNTER — Ambulatory Visit: Payer: Federal, State, Local not specified - PPO | Admitting: Hematology and Oncology

## 2023-08-11 ENCOUNTER — Encounter: Payer: Self-pay | Admitting: Hematology and Oncology

## 2023-08-27 ENCOUNTER — Inpatient Hospital Stay: Payer: Federal, State, Local not specified - PPO | Admitting: Hematology and Oncology

## 2023-08-27 ENCOUNTER — Encounter: Payer: Self-pay | Admitting: Hematology and Oncology

## 2023-08-27 ENCOUNTER — Inpatient Hospital Stay: Payer: Federal, State, Local not specified - PPO | Attending: Hematology and Oncology

## 2023-08-27 VITALS — BP 150/93 | HR 65 | Temp 97.5°F | Resp 18 | Ht 70.0 in | Wt 197.0 lb

## 2023-08-27 DIAGNOSIS — Z9221 Personal history of antineoplastic chemotherapy: Secondary | ICD-10-CM | POA: Diagnosis not present

## 2023-08-27 DIAGNOSIS — Z79899 Other long term (current) drug therapy: Secondary | ICD-10-CM | POA: Diagnosis not present

## 2023-08-27 DIAGNOSIS — D473 Essential (hemorrhagic) thrombocythemia: Secondary | ICD-10-CM | POA: Diagnosis not present

## 2023-08-27 DIAGNOSIS — Z7982 Long term (current) use of aspirin: Secondary | ICD-10-CM | POA: Insufficient documentation

## 2023-08-27 DIAGNOSIS — D61818 Other pancytopenia: Secondary | ICD-10-CM | POA: Diagnosis not present

## 2023-08-27 LAB — CBC WITH DIFFERENTIAL/PLATELET
Abs Immature Granulocytes: 0.02 10*3/uL (ref 0.00–0.07)
Basophils Absolute: 0 10*3/uL (ref 0.0–0.1)
Basophils Relative: 1 %
Eosinophils Absolute: 0.1 10*3/uL (ref 0.0–0.5)
Eosinophils Relative: 2 %
HCT: 37.1 % (ref 36.0–46.0)
Hemoglobin: 12.1 g/dL (ref 12.0–15.0)
Immature Granulocytes: 1 %
Lymphocytes Relative: 44 %
Lymphs Abs: 1.6 10*3/uL (ref 0.7–4.0)
MCH: 34.7 pg — ABNORMAL HIGH (ref 26.0–34.0)
MCHC: 32.6 g/dL (ref 30.0–36.0)
MCV: 106.3 fL — ABNORMAL HIGH (ref 80.0–100.0)
Monocytes Absolute: 0.3 10*3/uL (ref 0.1–1.0)
Monocytes Relative: 8 %
Neutro Abs: 1.6 10*3/uL — ABNORMAL LOW (ref 1.7–7.7)
Neutrophils Relative %: 44 %
Platelets: 444 10*3/uL — ABNORMAL HIGH (ref 150–400)
RBC: 3.49 MIL/uL — ABNORMAL LOW (ref 3.87–5.11)
RDW: 14.2 % (ref 11.5–15.5)
WBC: 3.6 10*3/uL — ABNORMAL LOW (ref 4.0–10.5)
nRBC: 0 % (ref 0.0–0.2)

## 2023-08-27 NOTE — Assessment & Plan Note (Signed)
She has mild intermittent leukopenia from treatment side effects Her chronic anemia is stable She is not symptomatic Observe only

## 2023-08-27 NOTE — Progress Notes (Signed)
[facility] OFFICE PROGRESS NOTE  Patient Care Team: Shirlean Mylar, MD as PCP - General (Family Medicine)  ASSESSMENT & PLAN:  Essential thrombocythemia [facility]) Her platelet count has gone up a little bit but the patient stated that she missed about a week's worth of anagrelide in January due to backorder Her blood count has been fairly stable over the past few years and I am reluctant to change the dose for now We will continue current dose of hydroxyurea and anagrelide and I will see her again in 3 months  Pancytopenia, acquired (HCC) She has mild intermittent leukopenia from treatment side effects Her chronic anemia is stable She is not symptomatic Observe only  No orders of the defined types were placed in this encounter.   All questions were answered. The patient knows to call the clinic with any problems, questions or concerns. The total time spent in the appointment was 20 minutes encounter with patients including review of chart and various tests results, discussions about plan of care and coordination of care plan   Artis Delay, MD 08/27/2023 11:21 AM  INTERVAL HISTORY: Please see below for problem oriented charting. she returns for chemo follow-up on hydroxyurea and anagrelide She is compliant taking her medications except there was delay of getting the order of anagrelide in January of which she missed 1 weeks dose She denies recent infection She stated her daughter is dealing with a lot of illness and her daughter might be diagnosed with similar condition  REVIEW OF SYSTEMS:   Constitutional: Denies fevers, chills or abnormal weight loss Eyes: Denies blurriness of vision Ears, nose, mouth, throat, and face: Denies mucositis or sore throat Respiratory: Denies cough, dyspnea or wheezes Cardiovascular: Denies palpitation, chest discomfort or lower extremity swelling Gastrointestinal:  Denies nausea, heartburn or change in bowel habits Skin: Denies abnormal  skin rashes Lymphatics: Denies new lymphadenopathy or easy bruising Neurological:Denies numbness, tingling or new weaknesses Behavioral/Psych: Mood is stable, no new changes  All other systems were reviewed with the patient and are negative.  I have reviewed the past medical history, past surgical history, social history and family history with the patient and they are unchanged from previous note.  ALLERGIES:  has no known allergies.  MEDICATIONS:  Current Outpatient Medications  Medication Sig Dispense Refill   ALPRAZolam (XANAX) 0.5 MG tablet Take 0.25 mg by mouth daily as needed for anxiety.     anagrelide (AGRYLIN) 1 MG capsule Take 1 capsule (1 mg total) by mouth daily. 30 capsule 6   aspirin 81 MG tablet Take 1 tablet (81 mg total) by mouth daily. 30 tablet 0   diclofenac sodium (VOLTAREN) 1 % GEL Apply 2 g topically 4 (four) times daily. 1 Tube 0   diphenhydramine-acetaminophen (TYLENOL PM) 25-500 MG TABS tablet Take 1 tablet by mouth at bedtime as needed.     hydroxyurea (HYDREA) 500 MG capsule TAKE 1 CAPSULE BY MOUTH EVERY DAY. EXCEPT ON SUNDAYS. DO NOT TAKE ON SUNDAYS 30 capsule 4   losartan-hydrochlorothiazide (HYZAAR) 100-25 MG tablet Take 1 tablet by mouth daily.     metoprolol (LOPRESSOR) 50 MG tablet Take 50 mg by mouth 2 (two) times daily.     Multiple Vitamin (MULTIVITAMIN) tablet Take 1 tablet by mouth daily.     vitamin B-12 (CYANOCOBALAMIN) 1000 MCG tablet Take 1 tablet (1,000 mcg total) by mouth daily. 30 tablet 6   No current facility-administered medications for this visit.    SUMMARY OF ONCOLOGIC HISTORY: Oncology History  Essential thrombocythemia (HCC)  12/10/2004 Miscellaneous   This patient was originally diagnosed with myeloproliferative disorder. According to her, she have chronic back pain. MRI showed abnormal bone marrow signal and she was subsequently referred to see an oncologist. Bone marrow biopsy dated 12/10/2004, confirm essential thrombocytosis.  The patient was started on Hydroxyurea 1000 mg daily for 5 days and 1500 mg on Tuesdays and Fridays and anagrelide 0.5 mg twice daily. She has poor compliance which she attributed to major depression. She self discontinued medication for over 6 months in 2016 to remain on aspirin to prevent risk of blood clots.   02/01/2015 -  Chemotherapy   On 02/01/2015, her treatment is switched to 0.5 mg of anagrelide daily along with aspirin In December 2017, anagrelide is increased to 1 mg daily along with aspirin From 2017-2019, she had intermittent treatment due to loss of follow-up and noncompliance And of April, anagrelide is stopped due to national shortage and she is prescribed hydroxyurea instead 03/03/2019: Her treatment is modified by introducing hydroxyurea 3 times a week along with anagrelide 1 mg daily On 08/31/2020, her treatment is modified by changing hydroxyurea to 4 times a week along with anagrelide 1 mg daily On 02/28/2021, her treatment is modified by changing hydroxyurea to daily 500 mg from Monday to Friday and skip Saturday and Sundays and to continue anagrelide at 1 mg daily On June 07, 2021, her treatment is modified by changing hydroxyurea to daily 500 mg except skip on Sundays and to continue anagrelide at 1 mg daily        PHYSICAL EXAMINATION: ECOG PERFORMANCE STATUS: 0 - Asymptomatic  Vitals:   08/27/23 1041  BP: (!) 150/93  Pulse: 65  Resp: 18  Temp: (!) 97.5 F (36.4 C)  SpO2: 98%   Filed Weights   08/27/23 1041  Weight: 197 lb (89.4 kg)    GENERAL:alert, no distress and comfortable LABORATORY DATA:  I have reviewed the data as listed    Component Value Date/Time   NA 139 11/23/2018 1148   NA 141 11/02/2014 1308   K 3.8 11/23/2018 1148   K 3.8 11/02/2014 1308   CL 104 11/23/2018 1148   CL 106 10/06/2012 1002   CO2 24 11/23/2018 1148   CO2 24 11/02/2014 1308   GLUCOSE 118 (H) 11/23/2018 1148   GLUCOSE 99 11/02/2014 1308   GLUCOSE 104 (H) 10/06/2012 1002    BUN 15 11/23/2018 1148   BUN 9.8 11/02/2014 1308   CREATININE 1.12 (H) 11/23/2018 1148   CREATININE 0.9 11/02/2014 1308   CALCIUM 9.6 11/23/2018 1148   CALCIUM 9.2 11/02/2014 1308   PROT 7.7 11/23/2018 1148   PROT 7.1 11/02/2014 1308   ALBUMIN 4.3 11/23/2018 1148   ALBUMIN 4.0 11/02/2014 1308   AST 16 11/23/2018 1148   AST 23 11/02/2014 1308   ALT 14 11/23/2018 1148   ALT 22 11/02/2014 1308   ALKPHOS 81 11/23/2018 1148   ALKPHOS 118 11/02/2014 1308   BILITOT 0.5 11/23/2018 1148   BILITOT 0.34 11/02/2014 1308   GFRNONAA 56 (L) 11/23/2018 1148   GFRAA >60 11/23/2018 1148    No results found for: "SPEP", "UPEP"  Lab Results  Component Value Date   WBC 3.6 (L) 08/27/2023   NEUTROABS 1.6 (L) 08/27/2023   HGB 12.1 08/27/2023   HCT 37.1 08/27/2023   MCV 106.3 (H) 08/27/2023   PLT 444 (H) 08/27/2023      Chemistry      Component Value Date/Time   NA  139 11/23/2018 1148   NA 141 11/02/2014 1308   K 3.8 11/23/2018 1148   K 3.8 11/02/2014 1308   CL 104 11/23/2018 1148   CL 106 10/06/2012 1002   CO2 24 11/23/2018 1148   CO2 24 11/02/2014 1308   BUN 15 11/23/2018 1148   BUN 9.8 11/02/2014 1308   CREATININE 1.12 (H) 11/23/2018 1148   CREATININE 0.9 11/02/2014 1308      Component Value Date/Time   CALCIUM 9.6 11/23/2018 1148   CALCIUM 9.2 11/02/2014 1308   ALKPHOS 81 11/23/2018 1148   ALKPHOS 118 11/02/2014 1308   AST 16 11/23/2018 1148   AST 23 11/02/2014 1308   ALT 14 11/23/2018 1148   ALT 22 11/02/2014 1308   BILITOT 0.5 11/23/2018 1148   BILITOT 0.34 11/02/2014 1308

## 2023-08-27 NOTE — Assessment & Plan Note (Signed)
Her platelet count has gone up a little bit but the patient stated that she missed about a week's worth of anagrelide in January due to backorder Her blood count has been fairly stable over the past few years and I am reluctant to change the dose for now We will continue current dose of hydroxyurea and anagrelide and I will see her again in 3 months

## 2023-11-03 ENCOUNTER — Telehealth: Payer: Self-pay | Admitting: Hematology and Oncology

## 2023-11-03 NOTE — Telephone Encounter (Signed)
 Patient rescheduled.

## 2023-11-18 ENCOUNTER — Other Ambulatory Visit: Payer: Self-pay | Admitting: Hematology and Oncology

## 2023-11-21 DIAGNOSIS — L03312 Cellulitis of back [any part except buttock]: Secondary | ICD-10-CM | POA: Diagnosis not present

## 2023-11-21 DIAGNOSIS — W57XXXA Bitten or stung by nonvenomous insect and other nonvenomous arthropods, initial encounter: Secondary | ICD-10-CM | POA: Diagnosis not present

## 2023-11-24 ENCOUNTER — Other Ambulatory Visit: Payer: Federal, State, Local not specified - PPO

## 2023-11-24 ENCOUNTER — Ambulatory Visit: Payer: Federal, State, Local not specified - PPO | Admitting: Hematology and Oncology

## 2023-11-24 DIAGNOSIS — Z1331 Encounter for screening for depression: Secondary | ICD-10-CM | POA: Diagnosis not present

## 2023-11-24 DIAGNOSIS — Z1231 Encounter for screening mammogram for malignant neoplasm of breast: Secondary | ICD-10-CM | POA: Diagnosis not present

## 2023-11-24 DIAGNOSIS — Z124 Encounter for screening for malignant neoplasm of cervix: Secondary | ICD-10-CM | POA: Diagnosis not present

## 2023-11-24 DIAGNOSIS — Z01419 Encounter for gynecological examination (general) (routine) without abnormal findings: Secondary | ICD-10-CM | POA: Diagnosis not present

## 2023-11-25 DIAGNOSIS — S80862A Insect bite (nonvenomous), left lower leg, initial encounter: Secondary | ICD-10-CM | POA: Diagnosis not present

## 2023-11-25 DIAGNOSIS — Z23 Encounter for immunization: Secondary | ICD-10-CM | POA: Diagnosis not present

## 2023-11-25 DIAGNOSIS — J309 Allergic rhinitis, unspecified: Secondary | ICD-10-CM | POA: Diagnosis not present

## 2023-11-25 DIAGNOSIS — I10 Essential (primary) hypertension: Secondary | ICD-10-CM | POA: Diagnosis not present

## 2023-11-26 ENCOUNTER — Inpatient Hospital Stay: Attending: Hematology and Oncology

## 2023-11-26 ENCOUNTER — Encounter: Payer: Self-pay | Admitting: Hematology and Oncology

## 2023-11-26 ENCOUNTER — Inpatient Hospital Stay: Admitting: Hematology and Oncology

## 2023-11-26 VITALS — BP 143/76 | HR 68 | Temp 98.7°F | Resp 18 | Ht 70.0 in | Wt 196.8 lb

## 2023-11-26 DIAGNOSIS — N182 Chronic kidney disease, stage 2 (mild): Secondary | ICD-10-CM | POA: Insufficient documentation

## 2023-11-26 DIAGNOSIS — D630 Anemia in neoplastic disease: Secondary | ICD-10-CM

## 2023-11-26 DIAGNOSIS — D473 Essential (hemorrhagic) thrombocythemia: Secondary | ICD-10-CM

## 2023-11-26 LAB — CBC WITH DIFFERENTIAL/PLATELET
Abs Immature Granulocytes: 0.01 10*3/uL (ref 0.00–0.07)
Basophils Absolute: 0.1 10*3/uL (ref 0.0–0.1)
Basophils Relative: 1 %
Eosinophils Absolute: 0 10*3/uL (ref 0.0–0.5)
Eosinophils Relative: 1 %
HCT: 33.8 % — ABNORMAL LOW (ref 36.0–46.0)
Hemoglobin: 11.2 g/dL — ABNORMAL LOW (ref 12.0–15.0)
Immature Granulocytes: 0 %
Lymphocytes Relative: 30 %
Lymphs Abs: 1.3 10*3/uL (ref 0.7–4.0)
MCH: 32.9 pg (ref 26.0–34.0)
MCHC: 33.1 g/dL (ref 30.0–36.0)
MCV: 99.4 fL (ref 80.0–100.0)
Monocytes Absolute: 0.4 10*3/uL (ref 0.1–1.0)
Monocytes Relative: 8 %
Neutro Abs: 2.6 10*3/uL (ref 1.7–7.7)
Neutrophils Relative %: 60 %
Platelets: 404 10*3/uL — ABNORMAL HIGH (ref 150–400)
RBC: 3.4 MIL/uL — ABNORMAL LOW (ref 3.87–5.11)
RDW: 15.5 % (ref 11.5–15.5)
WBC: 4.4 10*3/uL (ref 4.0–10.5)
nRBC: 0 % (ref 0.0–0.2)

## 2023-11-26 NOTE — Assessment & Plan Note (Addendum)
She has multifactorial anemia. She has mild chronic kidney failure that is stable We will observe only for now

## 2023-11-26 NOTE — Assessment & Plan Note (Addendum)
 She denies missing doses and has been compliant taking both anagrelide  and hydroxyurea  Repeat CBC today show improvement of her platelet count We will continue current dose of hydroxyurea  and anagrelide  and I will see her again in 3 months

## 2023-11-26 NOTE — Progress Notes (Signed)
 [facility] OFFICE PROGRESS NOTE  Patient Care Team: Sylvester Evert, MD as PCP - General (Family Medicine)  Assessment & Plan Essential thrombocythemia [facility]) She denies missing doses and has been compliant taking both anagrelide  and hydroxyurea  Repeat CBC today show improvement of her platelet count We will continue current dose of hydroxyurea  and anagrelide  and I will see her again in 3 months Anemia in neoplastic disease She has multifactorial anemia. She has mild chronic kidney failure that is stable We will observe only for now  No orders of the defined types were placed in this encounter.    Almeda Jacobs, MD  INTERVAL HISTORY: she returns for surveillance follow-up for myeloproliferative disorder/neoplasm Patient denies recent bleeding such as epistaxis, hematuria or hematochezia She is placed on antibiotics since Sunday due to recent tick bite We reviewed medication list and discussed medication changes We discussed test results and future plan of care as outlined above  PHYSICAL EXAMINATION: ECOG PERFORMANCE STATUS: 0 - Asymptomatic  Vitals:   11/26/23 1032  BP: (!) 143/76  Pulse: 68  Resp: 18  Temp: 98.7 F (37.1 C)  SpO2: 98%   Lab Results  Component Value Date   WBC 4.4 11/26/2023   HGB 11.2 (L) 11/26/2023   HCT 33.8 (L) 11/26/2023   MCV 99.4 11/26/2023   PLT 404 (H) 11/26/2023

## 2023-12-23 DIAGNOSIS — D2372 Other benign neoplasm of skin of left lower limb, including hip: Secondary | ICD-10-CM | POA: Diagnosis not present

## 2023-12-23 DIAGNOSIS — D225 Melanocytic nevi of trunk: Secondary | ICD-10-CM | POA: Diagnosis not present

## 2023-12-23 DIAGNOSIS — L821 Other seborrheic keratosis: Secondary | ICD-10-CM | POA: Diagnosis not present

## 2023-12-23 DIAGNOSIS — L309 Dermatitis, unspecified: Secondary | ICD-10-CM | POA: Diagnosis not present

## 2024-01-11 ENCOUNTER — Other Ambulatory Visit: Payer: Self-pay | Admitting: Hematology and Oncology

## 2024-03-01 ENCOUNTER — Inpatient Hospital Stay: Admitting: Hematology and Oncology

## 2024-03-01 ENCOUNTER — Inpatient Hospital Stay

## 2024-03-01 ENCOUNTER — Telehealth: Payer: Self-pay | Admitting: Hematology and Oncology

## 2024-03-09 ENCOUNTER — Telehealth: Payer: Self-pay | Admitting: *Deleted

## 2024-03-09 NOTE — Telephone Encounter (Signed)
 Per Dr. Lonn, called pt to f/u on missed appts from 8/5. Left voice message to call so that she can reschedule OV.

## 2024-03-09 NOTE — Telephone Encounter (Signed)
-----   Message from Almarie Bedford sent at 03/09/2024  9:11 AM EDT ----- She canceled her recent appt and never rescheduled Can you call to rescheudle?

## 2024-04-05 ENCOUNTER — Encounter: Payer: Self-pay | Admitting: Hematology and Oncology

## 2024-04-05 ENCOUNTER — Inpatient Hospital Stay: Attending: Hematology and Oncology

## 2024-04-05 ENCOUNTER — Inpatient Hospital Stay (HOSPITAL_BASED_OUTPATIENT_CLINIC_OR_DEPARTMENT_OTHER): Admitting: Hematology and Oncology

## 2024-04-05 VITALS — BP 146/86 | HR 54 | Temp 97.7°F | Resp 18 | Ht 70.0 in | Wt 194.2 lb

## 2024-04-05 DIAGNOSIS — D473 Essential (hemorrhagic) thrombocythemia: Secondary | ICD-10-CM | POA: Diagnosis not present

## 2024-04-05 DIAGNOSIS — D75839 Thrombocytosis, unspecified: Secondary | ICD-10-CM | POA: Insufficient documentation

## 2024-04-05 DIAGNOSIS — Z7982 Long term (current) use of aspirin: Secondary | ICD-10-CM | POA: Insufficient documentation

## 2024-04-05 LAB — CBC WITH DIFFERENTIAL/PLATELET
Abs Immature Granulocytes: 0.02 K/uL (ref 0.00–0.07)
Basophils Absolute: 0 K/uL (ref 0.0–0.1)
Basophils Relative: 1 %
Eosinophils Absolute: 0 K/uL (ref 0.0–0.5)
Eosinophils Relative: 1 %
HCT: 34.9 % — ABNORMAL LOW (ref 36.0–46.0)
Hemoglobin: 11.8 g/dL — ABNORMAL LOW (ref 12.0–15.0)
Immature Granulocytes: 1 %
Lymphocytes Relative: 53 %
Lymphs Abs: 1.4 K/uL (ref 0.7–4.0)
MCH: 36.1 pg — ABNORMAL HIGH (ref 26.0–34.0)
MCHC: 33.8 g/dL (ref 30.0–36.0)
MCV: 106.7 fL — ABNORMAL HIGH (ref 80.0–100.0)
Monocytes Absolute: 0.2 K/uL (ref 0.1–1.0)
Monocytes Relative: 7 %
Neutro Abs: 1 K/uL — ABNORMAL LOW (ref 1.7–7.7)
Neutrophils Relative %: 37 %
Platelets: 254 K/uL (ref 150–400)
RBC: 3.27 MIL/uL — ABNORMAL LOW (ref 3.87–5.11)
RDW: 14.7 % (ref 11.5–15.5)
WBC: 2.6 K/uL — ABNORMAL LOW (ref 4.0–10.5)
nRBC: 0 % (ref 0.0–0.2)

## 2024-04-05 MED ORDER — HYDROXYUREA 500 MG PO CAPS: ORAL_CAPSULE | ORAL | 2 refills | Status: DC

## 2024-04-05 NOTE — Progress Notes (Signed)
 [facility] OFFICE PROGRESS NOTE  Patient Care Team: Wendy Ivanoff, MD as PCP - General (Family Medicine)  Assessment & Plan Essential thrombocythemia [facility]) She denies missing doses and has been compliant taking both anagrelide  and hydroxyurea  Repeat CBC today show improvement of her platelet count but at the expense of causing mild leukopenia I recommend reducing the dose of hydroxyurea  to 500 mg daily from Monday to Friday only and to skip the weekend She will continue current dose of anagrelide  at 1 mg daily I will see her again in 3 months  No orders of the defined types were placed in this encounter.    Wendy Bedford, MD  INTERVAL HISTORY: she returns for surveillance follow-up for myeloproliferative disorder/neoplasm Patient denies recent bleeding such as epistaxis, hematuria or hematochezia No recent infection We reviewed medication list and discussed medication changes We discussed test results and future plan of care as outlined above  PHYSICAL EXAMINATION: ECOG PERFORMANCE STATUS: 0 - Asymptomatic  Vitals:   04/05/24 1210  BP: (!) 146/86  Pulse: (!) 54  Resp: 18  Temp: 97.7 F (36.5 C)  SpO2: 99%   Lab Results  Component Value Date   WBC 2.6 (L) 04/05/2024   HGB 11.8 (L) 04/05/2024   HCT 34.9 (L) 04/05/2024   MCV 106.7 (H) 04/05/2024   PLT 254 04/05/2024   Oncology History  Essential thrombocythemia (HCC)  12/10/2004 Miscellaneous   This patient was originally diagnosed with myeloproliferative disorder. According to her, she have chronic back pain. MRI showed abnormal bone marrow signal and she was subsequently referred to see an oncologist. Bone marrow biopsy dated 12/10/2004, confirm essential thrombocytosis. The patient was started on Hydroxyurea  1000 mg daily for 5 days and 1500 mg on Tuesdays and Fridays and anagrelide  0.5 mg twice daily. She has poor compliance which she attributed to major depression. She self discontinued medication for over  6 months in 2016 to remain on aspirin  to prevent risk of blood clots.   02/01/2015 -  Chemotherapy   On 02/01/2015, her treatment is switched to 0.5 mg of anagrelide  daily along with aspirin  In December 2017, anagrelide  is increased to 1 mg daily along with aspirin  From 2017-2019, she had intermittent treatment due to loss of follow-up and noncompliance And of April, anagrelide  is stopped due to [employer] and she is prescribed hydroxyurea  instead 03/03/2019: Her treatment is modified by introducing hydroxyurea  3 times a week along with anagrelide  1 mg daily On 08/31/2020, her treatment is modified by changing hydroxyurea  to 4 times a week along with anagrelide  1 mg daily On 02/28/2021, her treatment is modified by changing hydroxyurea  to daily 500 mg from Monday to Friday and skip Saturday and Sundays and to continue anagrelide  at 1 mg daily On June 07, 2021, her treatment is modified by changing hydroxyurea  to daily 500 mg except skip on Sundays and to continue anagrelide  at 1 mg daily

## 2024-04-05 NOTE — Assessment & Plan Note (Addendum)
 She denies missing doses and has been compliant taking both anagrelide  and hydroxyurea  Repeat CBC today show improvement of her platelet count but at the expense of causing mild leukopenia I recommend reducing the dose of hydroxyurea  to 500 mg daily from Monday to Friday only and to skip the weekend She will continue current dose of anagrelide  at 1 mg daily I will see her again in 3 months

## 2024-04-27 DIAGNOSIS — J309 Allergic rhinitis, unspecified: Secondary | ICD-10-CM | POA: Diagnosis not present

## 2024-04-27 DIAGNOSIS — I10 Essential (primary) hypertension: Secondary | ICD-10-CM | POA: Diagnosis not present

## 2024-04-27 DIAGNOSIS — E559 Vitamin D deficiency, unspecified: Secondary | ICD-10-CM | POA: Diagnosis not present

## 2024-04-27 DIAGNOSIS — Z23 Encounter for immunization: Secondary | ICD-10-CM | POA: Diagnosis not present

## 2024-04-27 DIAGNOSIS — Z0000 Encounter for general adult medical examination without abnormal findings: Secondary | ICD-10-CM | POA: Diagnosis not present

## 2024-04-27 DIAGNOSIS — Z636 Dependent relative needing care at home: Secondary | ICD-10-CM | POA: Diagnosis not present

## 2024-04-27 DIAGNOSIS — E785 Hyperlipidemia, unspecified: Secondary | ICD-10-CM | POA: Diagnosis not present

## 2024-04-27 DIAGNOSIS — N1831 Chronic kidney disease, stage 3a: Secondary | ICD-10-CM | POA: Diagnosis not present

## 2024-04-27 DIAGNOSIS — E538 Deficiency of other specified B group vitamins: Secondary | ICD-10-CM | POA: Diagnosis not present

## 2024-05-02 ENCOUNTER — Other Ambulatory Visit: Payer: Self-pay | Admitting: Hematology and Oncology

## 2024-06-29 DIAGNOSIS — M160 Bilateral primary osteoarthritis of hip: Secondary | ICD-10-CM | POA: Diagnosis not present

## 2024-06-29 DIAGNOSIS — M25551 Pain in right hip: Secondary | ICD-10-CM | POA: Diagnosis not present

## 2024-06-29 DIAGNOSIS — M25552 Pain in left hip: Secondary | ICD-10-CM | POA: Diagnosis not present

## 2024-07-05 ENCOUNTER — Inpatient Hospital Stay: Admitting: Hematology and Oncology

## 2024-07-05 ENCOUNTER — Inpatient Hospital Stay: Attending: Hematology and Oncology

## 2024-08-04 ENCOUNTER — Encounter: Payer: Self-pay | Admitting: Hematology and Oncology

## 2024-08-04 ENCOUNTER — Inpatient Hospital Stay (HOSPITAL_BASED_OUTPATIENT_CLINIC_OR_DEPARTMENT_OTHER): Admitting: Hematology and Oncology

## 2024-08-04 ENCOUNTER — Inpatient Hospital Stay: Attending: Hematology and Oncology

## 2024-08-04 VITALS — BP 126/70 | HR 55 | Temp 97.4°F | Resp 18 | Ht 70.0 in | Wt 192.6 lb

## 2024-08-04 DIAGNOSIS — Z7964 Long term (current) use of myelosuppressive agent: Secondary | ICD-10-CM | POA: Insufficient documentation

## 2024-08-04 DIAGNOSIS — D473 Essential (hemorrhagic) thrombocythemia: Secondary | ICD-10-CM

## 2024-08-04 LAB — CBC WITH DIFFERENTIAL/PLATELET
Abs Immature Granulocytes: 0.01 K/uL (ref 0.00–0.07)
Basophils Absolute: 0 K/uL (ref 0.0–0.1)
Basophils Relative: 1 %
Eosinophils Absolute: 0 K/uL (ref 0.0–0.5)
Eosinophils Relative: 0 %
HCT: 34.3 % — ABNORMAL LOW (ref 36.0–46.0)
Hemoglobin: 11.5 g/dL — ABNORMAL LOW (ref 12.0–15.0)
Immature Granulocytes: 0 %
Lymphocytes Relative: 42 %
Lymphs Abs: 1.7 K/uL (ref 0.7–4.0)
MCH: 34.7 pg — ABNORMAL HIGH (ref 26.0–34.0)
MCHC: 33.5 g/dL (ref 30.0–36.0)
MCV: 103.6 fL — ABNORMAL HIGH (ref 80.0–100.0)
Monocytes Absolute: 0.3 K/uL (ref 0.1–1.0)
Monocytes Relative: 7 %
Neutro Abs: 2.1 K/uL (ref 1.7–7.7)
Neutrophils Relative %: 50 %
Platelets: 438 K/uL — ABNORMAL HIGH (ref 150–400)
RBC: 3.31 MIL/uL — ABNORMAL LOW (ref 3.87–5.11)
RDW: 13.8 % (ref 11.5–15.5)
WBC: 4.1 K/uL (ref 4.0–10.5)
nRBC: 0 % (ref 0.0–0.2)

## 2024-08-04 NOTE — Progress Notes (Signed)
 [facility] OFFICE PROGRESS NOTE  Patient Care Team: Douglass Ivanoff, MD as PCP - General (Family Medicine)  Assessment & Plan Essential thrombocythemia [facility]) She denies missing doses and has been compliant taking both anagrelide  and hydroxyurea  Repeat CBC today show slightly higher platelet count but no signs of leukopenia For now, I gave her instruction to continue hydroxyurea  at 500 mg daily from Monday to Friday only and to skip the weekend She will continue current dose of anagrelide  at 1 mg daily I will see her again in 3 months  No orders of the defined types were placed in this encounter.    Almarie Bedford, MD  INTERVAL HISTORY: she returns for surveillance follow-up for myeloproliferative disorder/neoplasm Patient denies recent bleeding such as epistaxis, hematuria or hematochezia No recent infection We reviewed medication list and discussed medication changes We discussed test results and future plan of care as outlined above  PHYSICAL EXAMINATION: ECOG PERFORMANCE STATUS: 0 - Asymptomatic  Vitals:   08/04/24 1008  BP: 126/70  Pulse: (!) 55  Resp: 18  Temp: (!) 97.4 F (36.3 C)  SpO2: 100%   Lab Results  Component Value Date   WBC 4.1 08/04/2024   HGB 11.5 (L) 08/04/2024   HCT 34.3 (L) 08/04/2024   MCV 103.6 (H) 08/04/2024   PLT 438 (H) 08/04/2024

## 2024-08-04 NOTE — Assessment & Plan Note (Addendum)
 She denies missing doses and has been compliant taking both anagrelide  and hydroxyurea  Repeat CBC today show slightly higher platelet count but no signs of leukopenia For now, I gave her instruction to continue hydroxyurea  at 500 mg daily from Monday to Friday only and to skip the weekend She will continue current dose of anagrelide  at 1 mg daily I will see her again in 3 months

## 2024-08-18 ENCOUNTER — Other Ambulatory Visit: Payer: Self-pay | Admitting: Hematology and Oncology

## 2024-11-03 ENCOUNTER — Inpatient Hospital Stay

## 2024-11-03 ENCOUNTER — Inpatient Hospital Stay: Admitting: Hematology and Oncology
